# Patient Record
Sex: Female | Born: 1945 | Race: White | Hispanic: No | Smoking: Never smoker
Health system: Southern US, Community
[De-identification: ages and names within clinical notes are randomized; demographics above are authoritative.]

## PROBLEM LIST (undated history)

## (undated) DIAGNOSIS — F32A Depression, unspecified: Secondary | ICD-10-CM

## (undated) DIAGNOSIS — Z87820 Personal history of traumatic brain injury: Secondary | ICD-10-CM

## (undated) DIAGNOSIS — E119 Type 2 diabetes mellitus without complications: Secondary | ICD-10-CM

## (undated) DIAGNOSIS — E785 Hyperlipidemia, unspecified: Secondary | ICD-10-CM

## (undated) DIAGNOSIS — I10 Essential (primary) hypertension: Secondary | ICD-10-CM

## (undated) DIAGNOSIS — K5190 Ulcerative colitis, unspecified, without complications: Secondary | ICD-10-CM

## (undated) DIAGNOSIS — F419 Anxiety disorder, unspecified: Secondary | ICD-10-CM

## (undated) DIAGNOSIS — F439 Reaction to severe stress, unspecified: Secondary | ICD-10-CM

## (undated) DIAGNOSIS — F329 Major depressive disorder, single episode, unspecified: Secondary | ICD-10-CM

## (undated) DIAGNOSIS — K219 Gastro-esophageal reflux disease without esophagitis: Secondary | ICD-10-CM

## (undated) DIAGNOSIS — F410 Panic disorder [episodic paroxysmal anxiety] without agoraphobia: Secondary | ICD-10-CM

## (undated) DIAGNOSIS — M26629 Arthralgia of temporomandibular joint, unspecified side: Secondary | ICD-10-CM

## (undated) DIAGNOSIS — G5600 Carpal tunnel syndrome, unspecified upper limb: Secondary | ICD-10-CM

## (undated) DIAGNOSIS — E7800 Pure hypercholesterolemia, unspecified: Secondary | ICD-10-CM

## (undated) DIAGNOSIS — E079 Disorder of thyroid, unspecified: Secondary | ICD-10-CM

## (undated) HISTORY — DX: Personal history of traumatic brain injury: Z87.820

## (undated) HISTORY — DX: Ulcerative colitis, unspecified, without complications: K51.90

## (undated) HISTORY — PX: TOTAL ABDOMINAL HYSTERECTOMY: SHX209

## (undated) HISTORY — DX: Type 2 diabetes mellitus without complications: E11.9

## (undated) HISTORY — DX: Hyperlipidemia, unspecified: E78.5

## (undated) HISTORY — DX: Gastro-esophageal reflux disease without esophagitis: K21.9

## (undated) HISTORY — DX: Depression, unspecified: F32.A

## (undated) HISTORY — DX: Anxiety disorder, unspecified: F41.9

## (undated) HISTORY — PX: HX HYSTERECTOMY: SHX81

## (undated) HISTORY — PX: ORIF ANKLE FRACTURE: SUR919

## (undated) HISTORY — DX: Essential (primary) hypertension: I10

## (undated) HISTORY — DX: Reaction to severe stress, unspecified: F43.9

## (undated) HISTORY — PX: ABDOMINAL HYSTERECTOMY: SHX81

## (undated) HISTORY — PX: FRACTURE SURGERY: SHX138

---
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
## 1992-11-18 ENCOUNTER — Ambulatory Visit (HOSPITAL_COMMUNITY): Payer: Self-pay

## 1995-12-27 ENCOUNTER — Ambulatory Visit (HOSPITAL_COMMUNITY): Payer: Self-pay

## 2005-09-13 ENCOUNTER — Ambulatory Visit (INDEPENDENT_AMBULATORY_CARE_PROVIDER_SITE_OTHER): Payer: Self-pay | Admitting: Neurology

## 2012-04-23 ENCOUNTER — Emergency Department (HOSPITAL_BASED_OUTPATIENT_CLINIC_OR_DEPARTMENT_OTHER)
Admission: EM | Admit: 2012-04-23 | Discharge: 2012-04-23 | Disposition: A | Discharge status: Home / Self Care | Payer: Medicare Other | Attending: Emergency Medicine | Admitting: Emergency Medicine

## 2012-04-23 ENCOUNTER — Encounter (HOSPITAL_BASED_OUTPATIENT_CLINIC_OR_DEPARTMENT_OTHER): Payer: Self-pay | Admitting: *Deleted

## 2012-04-23 ENCOUNTER — Emergency Department (HOSPITAL_BASED_OUTPATIENT_CLINIC_OR_DEPARTMENT_OTHER): Payer: Medicare Other

## 2012-04-23 DIAGNOSIS — E119 Type 2 diabetes mellitus without complications: Secondary | ICD-10-CM | POA: Insufficient documentation

## 2012-04-23 DIAGNOSIS — G5600 Carpal tunnel syndrome, unspecified upper limb: Secondary | ICD-10-CM | POA: Insufficient documentation

## 2012-04-23 DIAGNOSIS — K219 Gastro-esophageal reflux disease without esophagitis: Secondary | ICD-10-CM | POA: Insufficient documentation

## 2012-04-23 DIAGNOSIS — Z79899 Other long term (current) drug therapy: Secondary | ICD-10-CM | POA: Insufficient documentation

## 2012-04-23 DIAGNOSIS — I10 Essential (primary) hypertension: Secondary | ICD-10-CM | POA: Insufficient documentation

## 2012-04-23 DIAGNOSIS — G5602 Carpal tunnel syndrome, left upper limb: Secondary | ICD-10-CM

## 2012-04-23 DIAGNOSIS — M67439 Ganglion, unspecified wrist: Secondary | ICD-10-CM

## 2012-04-23 DIAGNOSIS — E7800 Pure hypercholesterolemia, unspecified: Secondary | ICD-10-CM | POA: Insufficient documentation

## 2012-04-23 DIAGNOSIS — M6740 Ganglion, unspecified site: Secondary | ICD-10-CM | POA: Insufficient documentation

## 2012-04-23 DIAGNOSIS — E079 Disorder of thyroid, unspecified: Secondary | ICD-10-CM | POA: Insufficient documentation

## 2012-04-23 HISTORY — DX: Disorder of thyroid, unspecified: E07.9

## 2012-04-23 HISTORY — DX: Carpal tunnel syndrome, unspecified upper limb: G56.00

## 2012-04-23 HISTORY — DX: Major depressive disorder, single episode, unspecified: F32.9

## 2012-04-23 HISTORY — DX: Gastro-esophageal reflux disease without esophagitis: K21.9

## 2012-04-23 HISTORY — DX: Pure hypercholesterolemia, unspecified: E78.00

## 2012-04-23 HISTORY — DX: Anxiety disorder, unspecified: F41.9

## 2012-04-23 HISTORY — DX: Panic disorder (episodic paroxysmal anxiety): F41.0

## 2012-04-23 HISTORY — DX: Essential (primary) hypertension: I10

## 2012-04-23 HISTORY — DX: Depression, unspecified: F32.A

## 2012-04-23 MED ORDER — FLUTICASONE FUROATE 27.5 MCG/SPRAY NA SUSP: NASAL | Status: AC

## 2012-04-23 MED ORDER — TETANUS-DIPHTH-ACELL PERTUSSIS 5-2.5-18.5 LF-MCG/0.5 IM SUSP
0.5000 mL | Freq: Once | INTRAMUSCULAR | Status: DC
  Filled 2012-04-23: qty 0.5

## 2012-04-23 NOTE — ED Notes (Signed)
Pt states she hasn't had any vaccines since childhood- does not want tetanus booster today due to multiple allergies to "chemicals" and is afraid of a reaction

## 2012-04-23 NOTE — ED Provider Notes (Signed)
History   This chart was scribed for Stacey Numbers, MD by Stacey Thomas . The patient was seen in room MH06/MH06.  '  CSN: [id number]  Arrival date & time 04/23/12  1403   First MD Initiated Contact with Patient 04/23/12 1502      Chief Complaint  Patient presents with  . Hand Pain    (Consider location/radiation/quality/duration/timing/severity/associated sxs/prior treatment) HPI Stacey Thomas is a 66 y.o. female who presents to the Emergency Department complaining of constant, moderate left hand pain. Patient states that she has had trouble with her left hand for over a year. Patient states that she believes there may be metal or glass in her hand due to moving 15 months ago. Patient states that her left hand pain worsened today and describes the pain as burning. Patient reports that the hand pain is a 10/10 at it's worse. Patient reports associated swelling.Patient states that she tried ice packs with minimal relief. Patient states that she took Aleve for her symptoms, but states that she has not taken any today. Patient states that she has been seen for carpel tunnel for the same hand previously. Patient reports numbness in her middle finger. Patient states that they preformed EMG. Patient reports that she also has a ganglion cyst on left hand. Tetanus is not UTD. Patient denies having any imaging performed.  Patient also reports having trouble with her ears and states that she has fluid. Patient states that she has an appt with a hearing doctor this month. Patient denies using any nasal spray or nasal steroids. Patient reports no relief of her symptoms.   Past Medical History  Diagnosis Date  . Diabetes mellitus   . Hypertension   . Acid reflux   . Thyroid disease   . Depression   . Anxiety   . Panic attacks   . Hypercholesteremia   . CTS (carpal tunnel syndrome)     Past Surgical History  Procedure Date  . Cesarean section   . Abdominal hysterectomy   . Fracture  surgery     History reviewed. No pertinent family history.  History  Substance Use Topics  . Smoking status: Never Smoker   . Smokeless tobacco: Not on file  . Alcohol Use: No    OB History    Grav Para Term Preterm Abortions TAB SAB Ect Mult Living                  Review of Systems  Constitutional: Negative for fever.  HENT: Positive for congestion.   Eyes: Negative.   Respiratory: Negative.   Cardiovascular: Negative for chest pain.  Gastrointestinal: Negative for nausea and vomiting.  Genitourinary: Negative.   Musculoskeletal:       Left hand pain.  Skin: Negative.   Neurological: Positive for numbness.  Hematological: Negative.   Psychiatric/Behavioral: Negative.   All other systems reviewed and are negative.    Allergies  Allegra; Claritin; Codeine; Ibuprofen; Pseudoephedrine hcl; Tylenol; and Zyrtec  Home Medications   Current Outpatient Rx  Name Route Sig Dispense Refill  . AMLODIPINE-OLMESARTAN 5-20 MG PO TABS Oral Take 1 tablet by mouth daily.    Marland Kitchen CLONAZEPAM 1 MG PO TABS Oral Take 1 mg by mouth 3 (three) times daily as needed.    . DULOXETINE HCL 60 MG PO CPEP Oral Take 60 mg by mouth 2 (two) times daily.    Marland Kitchen GLIPIZIDE ER 5 MG PO TB24 Oral Take 5 mg by mouth daily.    Marland Kitchen  LEVOTHYROXINE SODIUM 50 MCG PO TABS Oral Take 50 mcg by mouth daily.    Marland Kitchen METFORMIN HCL 1000 MG PO TABS Oral Take 1,000 mg by mouth 2 (two) times daily with a meal.    . PANTOPRAZOLE SODIUM 40 MG PO TBEC Oral Take 40 mg by mouth daily.      BP 124/71  Pulse 108  Temp 97.8 F (36.6 C) (Oral)  Resp 20  Ht 5' 1.5" (1.562 m)  Wt 183 lb (83.008 kg)  BMI 34.02 kg/m2  SpO2 96%  Physical Exam  Nursing note and vitals reviewed. GEN: Well-developed, well-nourished female in no distress HEENT: Atraumatic, normocephalic. Oropharynx clear without erythema. TMs clear bilaterally. EYES: PERRLA BL, no scleral icterus. NECK: Trachea midline, no meningismus CV: regular rate and rhythm. No  murmurs, rubs, or gallops PULM: No respiratory distress.  No crackles, wheezes, or rales. GI: soft, non-tender. No guarding, rebound, or tenderness. + bowel sounds  GU: deferred Neuro: cranial nerves grossly 2-12 intact, no abnormalities of strength or sensation, A and O x 3 MSK: Patient moves all 4 extremities symmetrically, no deformity, edema, or injury noted, patient has palpable one half centimeter ganglion cyst over the dorsum of the left wrist. Patient complains of decreased sensation in the first through third digits of the left hand. This has been present for over a year. Patient also has a small 2 mm x 1 mm area that is raised that she feels a foreign body stuck in her hand from an instant 15 months ago. This does not appear to be infected and has no area of erythema, fluctuance, or or induration associated with it. Skin: No rashes petechiae, purpura, or jaundice Psych: no abnormality of mood   ED Course  Procedures (including critical care time)  DIAGNOSTIC STUDIES: Oxygen Saturation is 96% on room air, adequate by my interpretation.    COORDINATION OF CARE:  1513: Discussed planned course of treatment with the patient, who is agreeable at this time.  1530: Medication Orders: TDap (Booxtrix) injection 0.5 mL-once.  1640: Informed patient of imaging results and negative findings for a foreign body.   Labs Reviewed - No data to display Dg Finger Thumb Left  04/23/2012  *RADIOLOGY REPORT*  Clinical Data: And swelling.  LEFT THUMB 2+V  Comparison: None.  Findings: DIP joint of the thumb osteoarthritis is present. Anatomic alignment.  Mild MCP joint osteoarthritis is also present. No radiopaque foreign body.  No fracture.  IMPRESSION: Mild osteoarthritis of the thumb.  No acute abnormality.  Original Report Authenticated By: Andreas Newport, M.D.     1. Ganglion cyst of wrist   2. Carpal tunnel syndrome of left wrist       MDM  Patient was evaluated by myself. He is on  evaluation patient had plain film of the film today and sure that there is no foreign body in the hand. I discussed that even if there were would not be removing that as it has been present for at least 15 months. Patient was offered a tetanus shot but then declined this. Patient had negative plain film which is reviewed by myself. Given her reports of carpal tunnel syndrome patient was provided with a left wrist splint. Patient also was given ice for this. She stated that she had an orthopedist in [place] she can followup with their did provide her with the name of her hand surgeon on call should she require followup for these issues. Patient also complained of fullness in her ear  is with some associated nasal congestion. She had no significant findings on exam today. I did write her a prescription for fluticasone to try for this. Patient was discharged in good condition. She can followup with her PCP regarding her sensation of ear fullness.  I personally performed the services described in this documentation, which was scribed in my presence. The recorded information has been reviewed and considered.          Stacey Numbers, MD 04/23/12 1710

## 2012-04-23 NOTE — ED Notes (Signed)
Pt states that she has CTS and is having multiple problems with her left hand. Also wants ears checked.

## 2012-04-23 NOTE — ED Notes (Signed)
D/c home with ice pack for home use and rx x 1

## 2012-08-12 ENCOUNTER — Encounter (HOSPITAL_BASED_OUTPATIENT_CLINIC_OR_DEPARTMENT_OTHER): Payer: Self-pay | Admitting: Student

## 2012-08-12 ENCOUNTER — Emergency Department (HOSPITAL_BASED_OUTPATIENT_CLINIC_OR_DEPARTMENT_OTHER)
Admission: EM | Admit: 2012-08-12 | Discharge: 2012-08-12 | Disposition: A | Discharge status: Home / Self Care | Payer: Medicare Other | Attending: Emergency Medicine | Admitting: Emergency Medicine

## 2012-08-12 DIAGNOSIS — Z8669 Personal history of other diseases of the nervous system and sense organs: Secondary | ICD-10-CM | POA: Insufficient documentation

## 2012-08-12 DIAGNOSIS — M542 Cervicalgia: Secondary | ICD-10-CM | POA: Insufficient documentation

## 2012-08-12 DIAGNOSIS — Z79899 Other long term (current) drug therapy: Secondary | ICD-10-CM | POA: Insufficient documentation

## 2012-08-12 DIAGNOSIS — F3289 Other specified depressive episodes: Secondary | ICD-10-CM | POA: Insufficient documentation

## 2012-08-12 DIAGNOSIS — Z8659 Personal history of other mental and behavioral disorders: Secondary | ICD-10-CM | POA: Insufficient documentation

## 2012-08-12 DIAGNOSIS — E079 Disorder of thyroid, unspecified: Secondary | ICD-10-CM | POA: Insufficient documentation

## 2012-08-12 DIAGNOSIS — F411 Generalized anxiety disorder: Secondary | ICD-10-CM | POA: Insufficient documentation

## 2012-08-12 DIAGNOSIS — F329 Major depressive disorder, single episode, unspecified: Secondary | ICD-10-CM | POA: Insufficient documentation

## 2012-08-12 DIAGNOSIS — H9209 Otalgia, unspecified ear: Secondary | ICD-10-CM | POA: Insufficient documentation

## 2012-08-12 DIAGNOSIS — E7800 Pure hypercholesterolemia, unspecified: Secondary | ICD-10-CM | POA: Insufficient documentation

## 2012-08-12 DIAGNOSIS — K219 Gastro-esophageal reflux disease without esophagitis: Secondary | ICD-10-CM | POA: Insufficient documentation

## 2012-08-12 DIAGNOSIS — M26629 Arthralgia of temporomandibular joint, unspecified side: Secondary | ICD-10-CM | POA: Insufficient documentation

## 2012-08-12 DIAGNOSIS — E119 Type 2 diabetes mellitus without complications: Secondary | ICD-10-CM | POA: Insufficient documentation

## 2012-08-12 DIAGNOSIS — I10 Essential (primary) hypertension: Secondary | ICD-10-CM | POA: Insufficient documentation

## 2012-08-12 DIAGNOSIS — F419 Anxiety disorder, unspecified: Secondary | ICD-10-CM

## 2012-08-12 MED ORDER — CLONAZEPAM 1 MG PO TABS
1.0000 mg | ORAL_TABLET | Freq: Once | ORAL | Status: DC
  Filled 2012-08-12: qty 1

## 2012-08-12 MED ORDER — LORAZEPAM 1 MG PO TABS
ORAL_TABLET | ORAL | Status: AC
  Administered 2012-08-12: 1 mg via ORAL
  Filled 2012-08-12: qty 1

## 2012-08-12 MED ORDER — CLONAZEPAM 0.5 MG PO TABS
0.5000 mg | ORAL_TABLET | Freq: Once | ORAL | Status: DC
  Filled 2012-08-12: qty 1

## 2012-08-12 MED ORDER — LORAZEPAM 1 MG PO TABS
1.0000 mg | ORAL_TABLET | Freq: Once | ORAL | Status: AC
  Administered 2012-08-12: 1 mg via ORAL

## 2012-08-12 NOTE — ED Notes (Signed)
MD at bedside. 

## 2012-08-12 NOTE — ED Notes (Signed)
Pt in with c/o bilateral TMJ pain, bilateral earache, neck pain.

## 2012-08-12 NOTE — ED Provider Notes (Signed)
History     CSN: [id number]  Arrival date & time 08/12/12  1303   First MD Initiated Contact with Patient 08/12/12 1325      Chief Complaint  Patient presents with  . Jaw Pain    bilateral jaw pain  . Otalgia  . 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 She did move down here about a year ago from [place] for [place] and has not established care with her oral surgeon since then. She does have a primary care physician but has not seen a dentist or oral surgeon here in town. She actually denies any need for pain medicine she just states that she's feeling very anxious about it. She states that she sees a psychiatrist who has not yet been able to refill her Klonopin for her anxiety and she feels like her nerves have been flaring up. She's been trying to get down to see her psychiatrist but she states that every time she has appointment she gets sick. She's not having any suicidal ideations.  Patient is a 66 y.o. female presenting with ear pain and neck pain.  Otalgia Associated symptoms include neck pain. Pertinent negatives include no headaches, no rhinorrhea, no abdominal pain, no diarrhea, no vomiting, no cough and no rash.  Neck Pain  Pertinent negatives include no chest pain, no numbness, no headaches and no weakness.    Past Medical History  Diagnosis Date  . Diabetes mellitus   . Hypertension   . Acid reflux   . Thyroid disease   . Depression   . Anxiety   . Panic attacks   . Hypercholesteremia   . CTS (carpal tunnel syndrome)     Past Surgical History  Procedure Date  . Cesarean section   . Abdominal hysterectomy   . Fracture surgery     History reviewed. No pertinent family history.  History    Substance Use Topics  . Smoking status: Never Smoker   . Smokeless tobacco: Not on file  . Alcohol Use: No    OB History    Grav Para Term Preterm Abortions TAB SAB Ect Mult Living                  Review of Systems  Constitutional: Negative for fever, chills, diaphoresis and fatigue.  HENT: Positive for ear pain, neck pain and dental problem. Negative for congestion, rhinorrhea, sneezing and trouble swallowing.   Eyes: Negative.   Respiratory: Negative for cough, chest tightness and shortness of breath.   Cardiovascular: Negative for chest pain and leg swelling.  Gastrointestinal: Negative for nausea, vomiting, abdominal pain, diarrhea and blood in stool.  Genitourinary: Negative for frequency, hematuria, flank pain and difficulty urinating.  Musculoskeletal: Negative for back pain and arthralgias.  Skin: Negative for rash.  Neurological: Negative for dizziness, speech difficulty, weakness, numbness and headaches.  Psychiatric/Behavioral: Negative for suicidal ideas and hallucinations. The patient is nervous/anxious.     Allergies  Allegra; Claritin; Codeine; Ibuprofen; Pseudoephedrine hcl; Tylenol; and Zyrtec  Home Medications   Current Outpatient Rx  Name Route Sig Dispense Refill  . AMLODIPINE-OLMESARTAN 5-20 MG PO TABS Oral Take 1 tablet by mouth daily.    Marland Kitchen CLONAZEPAM 1 MG PO TABS Oral Take 1 mg by mouth 3 (three) times daily as needed.    Marland Kitchen  DULOXETINE HCL 60 MG PO CPEP Oral Take 60 mg by mouth 2 (two) times daily.    Marland Kitchen FLUTICASONE FUROATE 27.5 MCG/SPRAY NA SUSP  2 sprays into each nasal passage daily as needed for congestion. 10 g 12  . GLIPIZIDE ER 5 MG PO TB24 Oral Take 5 mg by mouth daily.    Marland Kitchen LEVOTHYROXINE SODIUM 50 MCG PO TABS Oral Take 50 mcg by mouth daily.    Marland Kitchen METFORMIN HCL 1000 MG PO TABS Oral Take 1,000 mg by mouth 2 (two) times daily with a meal.    . PANTOPRAZOLE SODIUM 40 MG PO TBEC Oral Take 40 mg by mouth daily.      BP 138/70  Pulse 90  Temp  99.3 F (37.4 C) (Oral)  Resp 18  Wt 175 lb (79.379 kg)  SpO2 99%  Physical Exam  Constitutional: She is oriented to person, place, and time. She appears well-developed and well-nourished.  HENT:  Head: Normocephalic and atraumatic.  Mouth/Throat: Oropharynx is clear and moist.       No evidence of infection around her teeth. She has tenderness along her TMJ bilaterally however there is no clicking or popping sounds. Her TMs are clear bilaterally  Eyes: Pupils are equal, round, and reactive to light.  Neck: Normal range of motion. Neck supple.  Cardiovascular: Normal rate, regular rhythm and normal heart sounds.   Pulmonary/Chest: Effort normal and breath sounds normal. No respiratory distress. She has no wheezes. She has no rales. She exhibits no tenderness.  Abdominal: Soft. Bowel sounds are normal. There is no tenderness. There is no rebound and no guarding.  Musculoskeletal: Normal range of motion. She exhibits no edema.  Lymphadenopathy:    She has no cervical adenopathy.  Neurological: She is alert and oriented to person, place, and time.  Skin: Skin is warm and dry. No rash noted.  Psychiatric: She has a normal mood and affect.       She appears very anxious and is tearful at times    ED Course  Procedures (including critical care time)  Labs Reviewed - No data to display No results found.   1. TMJ arthralgia   2. Anxiety       MDM  Patient comes in today with multiple complaints primarily to her TMJ pain is bothering her. She denies any for pain medicine and says that her pain is controlled to leave. She appears very anxious and upset with her psychiatrist for not prescribing Klonopin. She did not appear to be suicidal. I discussed at length the importance of following up with her psychiatrist. I will go to her one dose of Klonopin here in emergency department and she was her followup with her psychiatrist on Monday. She has no evidence of dental infection currently.  She was given resources for followup regarding her TMJ.        Rolan Bucco, MD 08/12/12 1350

## 2013-08-18 ENCOUNTER — Emergency Department (HOSPITAL_BASED_OUTPATIENT_CLINIC_OR_DEPARTMENT_OTHER): Payer: Medicare Other

## 2013-08-18 ENCOUNTER — Emergency Department (HOSPITAL_BASED_OUTPATIENT_CLINIC_OR_DEPARTMENT_OTHER)
Admission: EM | Admit: 2013-08-18 | Discharge: 2013-08-18 | Disposition: A | Discharge status: Home / Self Care | Payer: Medicare Other | Attending: Emergency Medicine | Admitting: Emergency Medicine

## 2013-08-18 ENCOUNTER — Encounter (HOSPITAL_BASED_OUTPATIENT_CLINIC_OR_DEPARTMENT_OTHER): Payer: Self-pay | Admitting: Emergency Medicine

## 2013-08-18 DIAGNOSIS — M79671 Pain in right foot: Secondary | ICD-10-CM

## 2013-08-18 DIAGNOSIS — Z8669 Personal history of other diseases of the nervous system and sense organs: Secondary | ICD-10-CM | POA: Insufficient documentation

## 2013-08-18 DIAGNOSIS — K219 Gastro-esophageal reflux disease without esophagitis: Secondary | ICD-10-CM | POA: Insufficient documentation

## 2013-08-18 DIAGNOSIS — S8002XA Contusion of left knee, initial encounter: Secondary | ICD-10-CM

## 2013-08-18 DIAGNOSIS — E119 Type 2 diabetes mellitus without complications: Secondary | ICD-10-CM | POA: Insufficient documentation

## 2013-08-18 DIAGNOSIS — Z79899 Other long term (current) drug therapy: Secondary | ICD-10-CM | POA: Insufficient documentation

## 2013-08-18 DIAGNOSIS — F329 Major depressive disorder, single episode, unspecified: Secondary | ICD-10-CM | POA: Insufficient documentation

## 2013-08-18 DIAGNOSIS — F3289 Other specified depressive episodes: Secondary | ICD-10-CM | POA: Insufficient documentation

## 2013-08-18 DIAGNOSIS — S8000XA Contusion of unspecified knee, initial encounter: Secondary | ICD-10-CM | POA: Insufficient documentation

## 2013-08-18 DIAGNOSIS — Y9389 Activity, other specified: Secondary | ICD-10-CM | POA: Insufficient documentation

## 2013-08-18 DIAGNOSIS — W108XXA Fall (on) (from) other stairs and steps, initial encounter: Secondary | ICD-10-CM | POA: Insufficient documentation

## 2013-08-18 DIAGNOSIS — F410 Panic disorder [episodic paroxysmal anxiety] without agoraphobia: Secondary | ICD-10-CM | POA: Insufficient documentation

## 2013-08-18 DIAGNOSIS — IMO0002 Reserved for concepts with insufficient information to code with codable children: Secondary | ICD-10-CM | POA: Insufficient documentation

## 2013-08-18 DIAGNOSIS — Y9289 Other specified places as the place of occurrence of the external cause: Secondary | ICD-10-CM | POA: Insufficient documentation

## 2013-08-18 DIAGNOSIS — I10 Essential (primary) hypertension: Secondary | ICD-10-CM | POA: Insufficient documentation

## 2013-08-18 HISTORY — DX: Arthralgia of temporomandibular joint, unspecified side: M26.629

## 2013-08-18 HISTORY — DX: Gastro-esophageal reflux disease without esophagitis: K21.9

## 2013-08-18 MED ORDER — TRAMADOL HCL 50 MG PO TABS: 50.0000 mg | ORAL_TABLET | Freq: Four times a day (QID) | ORAL | Status: AC | PRN

## 2013-08-18 NOTE — ED Provider Notes (Signed)
CSN: [id number]     Arrival date & time 08/18/13  1650 History   First MD Initiated Contact with Patient 08/18/13 1840     Chief Complaint  Patient presents with  . Fall  . Knee Pain   (Consider location/radiation/quality/duration/timing/severity/associated sxs/prior Treatment) Patient is a 67 y.o. female presenting with fall. The history is provided by the patient. No language interpreter was used.  Fall This is a new problem. The current episode started today. The problem occurs constantly. The problem has been gradually worsening. Pertinent negatives include no joint swelling. Nothing aggravates the symptoms. She has tried nothing for the symptoms. The treatment provided no relief.   Pt complains of pain in her left knee after falling on steps.  Pain with walking.   Pt also complain of soreness in her foot Past Medical History  Diagnosis Date  . Diabetes mellitus   . Hypertension   . Acid reflux   . Thyroid disease   . Depression   . Anxiety   . Panic attacks   . Hypercholesteremia   . CTS (carpal tunnel syndrome)   . Acid reflux   . GERD (gastroesophageal reflux disease)   . TMJ arthralgia    Past Surgical History  Procedure Laterality Date  . Cesarean section    . Abdominal hysterectomy    . Fracture surgery     History reviewed. No pertinent family history. History  Substance Use Topics  . Smoking status: Never Smoker   . Smokeless tobacco: Not on file  . Alcohol Use: No   OB History   Grav Para Term Preterm Abortions TAB SAB Ect Mult Living                 Review of Systems  Musculoskeletal: Negative for joint swelling.  All other systems reviewed and are negative.    Allergies  Allegra; Claritin; Codeine; Cortisone; Ibuprofen; Pseudoephedrine hcl; Tylenol; and Zyrtec  Home Medications   Current Outpatient Rx  Name  Route  Sig  Dispense  Refill  . amLODipine-olmesartan (AZOR) 5-20 MG per tablet   Oral   Take 1 tablet by mouth daily.         .  clonazePAM (KLONOPIN) 1 MG tablet   Oral   Take 1 mg by mouth 3 (three) times daily as needed.         . DULoxetine (CYMBALTA) 60 MG capsule   Oral   Take 60 mg by mouth 2 (two) times daily.         . fluticasone (VERAMYST) 27.5 MCG/SPRAY nasal spray      2 sprays into each nasal passage daily as needed for congestion.   10 g   12   . glipiZIDE (GLUCOTROL XL) 5 MG 24 hr tablet   Oral   Take 5 mg by mouth daily.         Marland Kitchen levothyroxine (SYNTHROID, LEVOTHROID) 50 MCG tablet   Oral   Take 50 mcg by mouth daily.         . metFORMIN (GLUCOPHAGE) 1000 MG tablet   Oral   Take 1,000 mg by mouth 2 (two) times daily with a meal.         . pantoprazole (PROTONIX) 40 MG tablet   Oral   Take 40 mg by mouth daily.          BP 111/60  Pulse 94  Temp(Src) 98.5 F (36.9 C) (Oral)  Wt 179 lb (81.194 kg)  BMI 33.28 kg/m2  SpO2  94% Physical Exam  Nursing note and vitals reviewed. Constitutional: She is oriented to person, place, and time. She appears well-developed and well-nourished.  Musculoskeletal: She exhibits tenderness.  Tender bruised left knee,   Tender left foot, no bruising  Neurological: She is alert and oriented to person, place, and time. She has normal reflexes.  Skin: Skin is warm.  Psychiatric: She has a normal mood and affect.    ED Course  Procedures (including critical care time) Labs Review Labs Reviewed - No data to display Imaging Review Dg Knee Complete 4 Views Left  08/18/2013   CLINICAL DATA:  Fall. Knee injury and pain.  EXAM: LEFT KNEE - COMPLETE 4+ VIEW  COMPARISON:  None.  FINDINGS: There is no evidence of fracture, dislocation, or joint effusion. Mild degenerative spurring is seen involving the tibial spines. No evidence of joint space narrowing. Mild enthesopathic changes seen at the quadriceps insertion on the patella. No other bone abnormality identified.  IMPRESSION: No acute findings.   Electronically Signed   By: Myles Rosenthal M.D.    On: 08/18/2013 19:07   Dg Foot Complete Left  08/18/2013   CLINICAL DATA:  Fall. Foot injury and pain.  EXAM: LEFT FOOT - COMPLETE 3+ VIEW  COMPARISON:  None.  FINDINGS: There is no evidence of fracture or dislocation. Severe osteoarthritis is seen involving the 1st metatarsophalangeal joint. Plantar and dorsal calcaneal bone spurs also noted.  IMPRESSION: No acute findings.  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   Electronically Signed   By: Myles Rosenthal M.D.   On: 08/18/2013 19:08    EKG Interpretation   None       MDM   1. Contusion of left knee, initial encounter   2. Pain in right foot    No fracture.   Pt given rx for ultram.   Pt advised to follow up with Dr. Pearletha Forge for recheck in 1 week.    Elson Areas, PA-C 08/18/13 2009

## 2013-08-18 NOTE — ED Notes (Signed)
Pt reports fall while ascending cement stairs, hitting cement porch with left knee. Difficulty ambulating.

## 2013-08-19 NOTE — ED Provider Notes (Signed)
Medical screening examination/treatment/procedure(s) were performed by non-physician practitioner and as supervising physician I was immediately available for consultation/collaboration.  EKG Interpretation   None         Junius Argyle, MD 08/19/13 [phone]

## 2014-03-23 IMAGING — CR DG KNEE COMPLETE 4+V*L*
4 series · 4 of 4 positions shown · non-contrast
Comparison: None.

CLINICAL DATA: Fall. Knee injury and pain.

EXAM:
LEFT KNEE - COMPLETE 4+ VIEW

[t knee ap left]
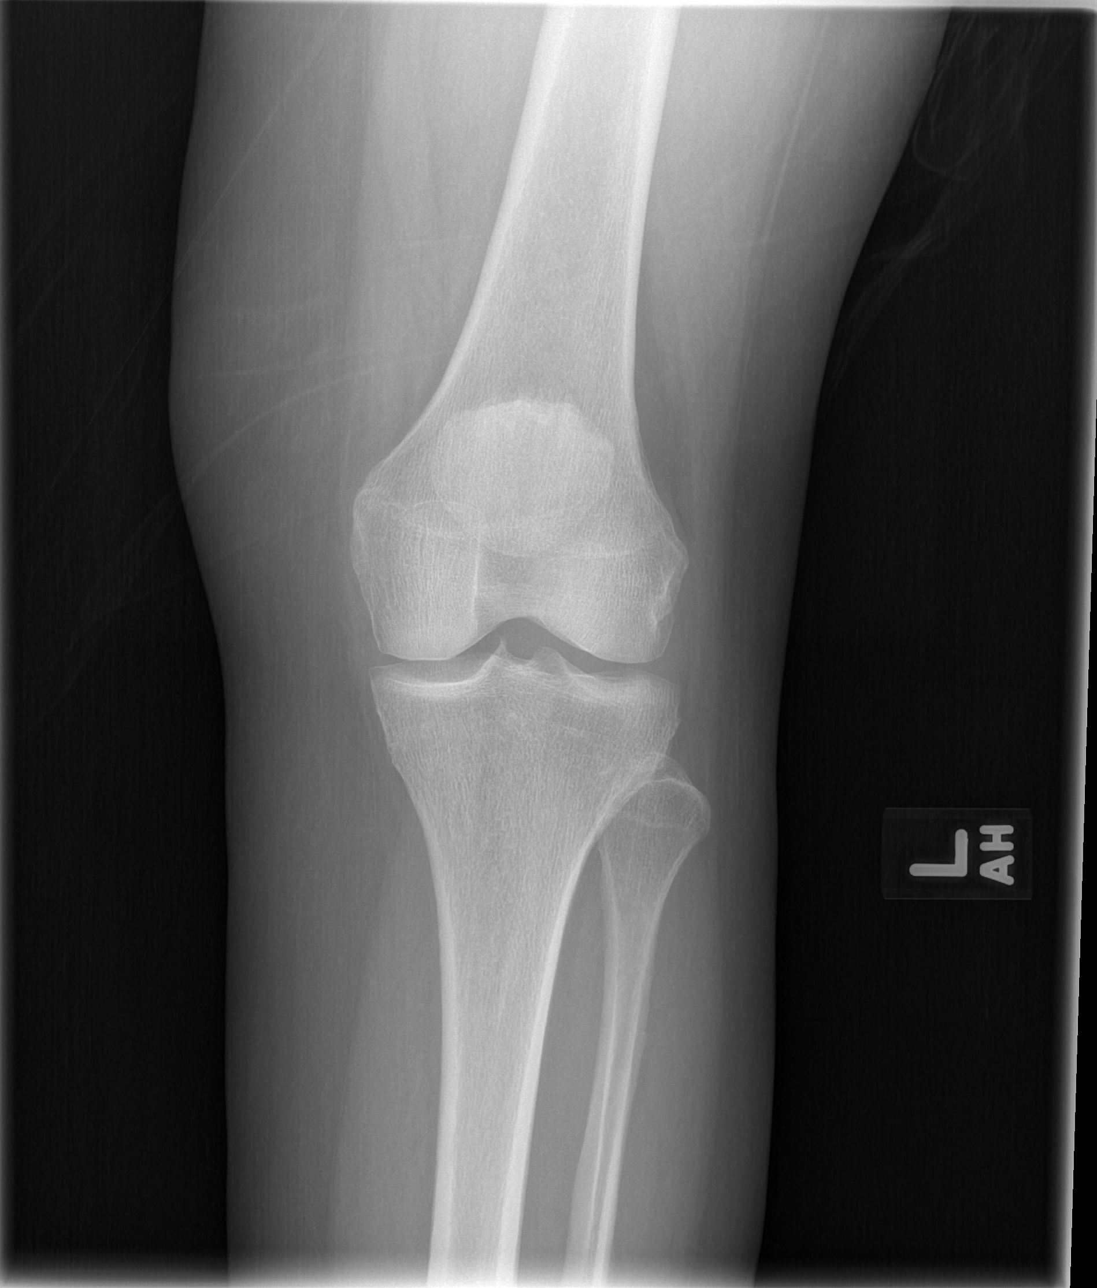

[t knee oblique left (1 of 2)]
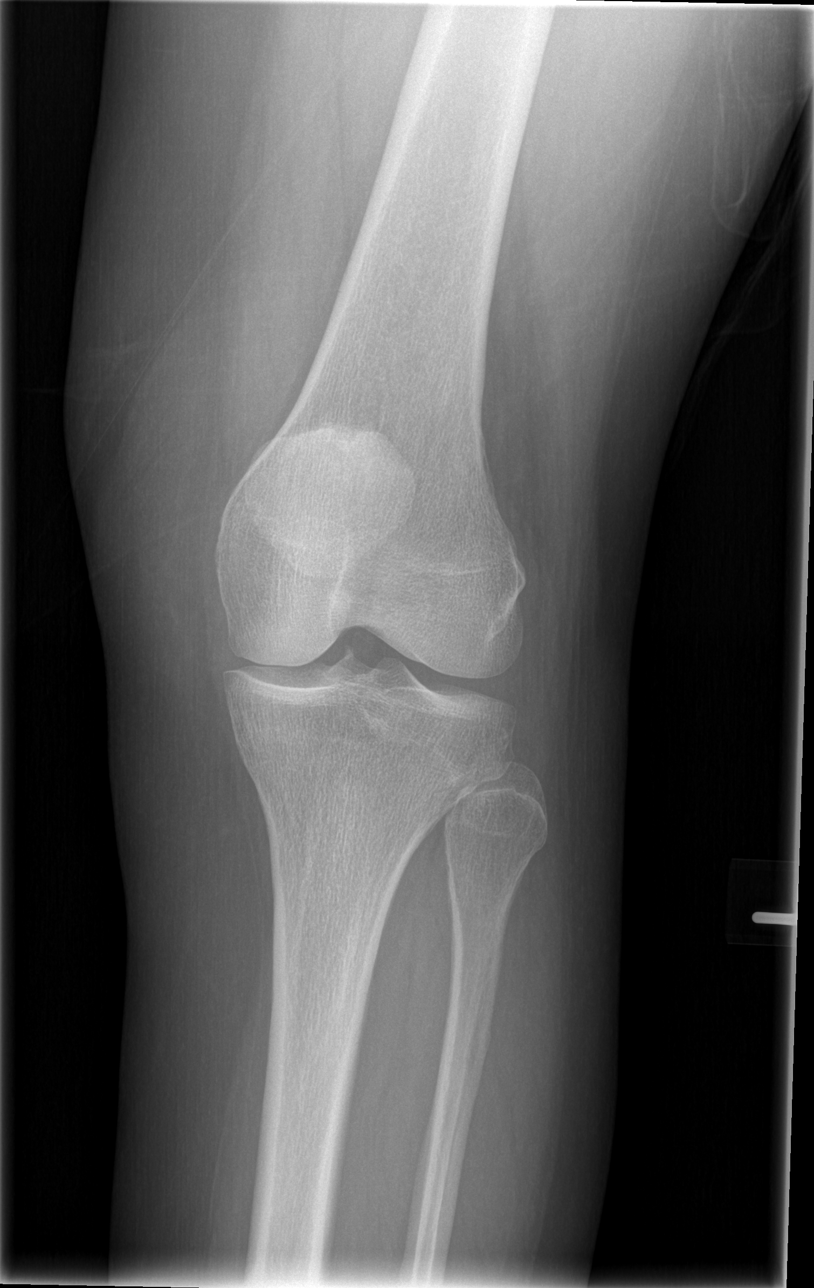

[t knee oblique left (2 of 2)]
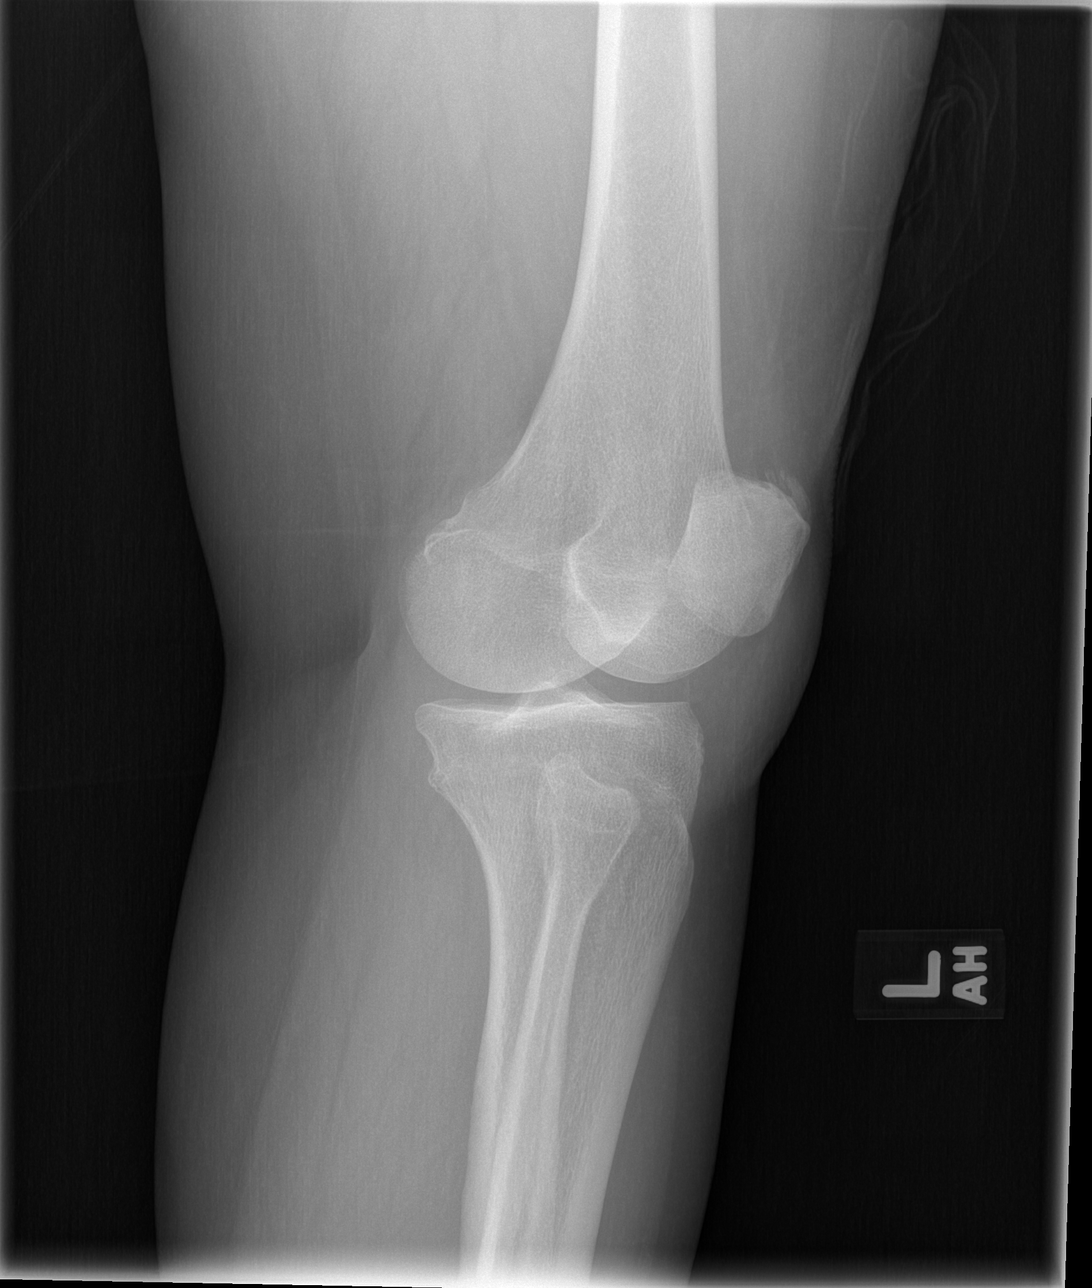

[t knee lat left]
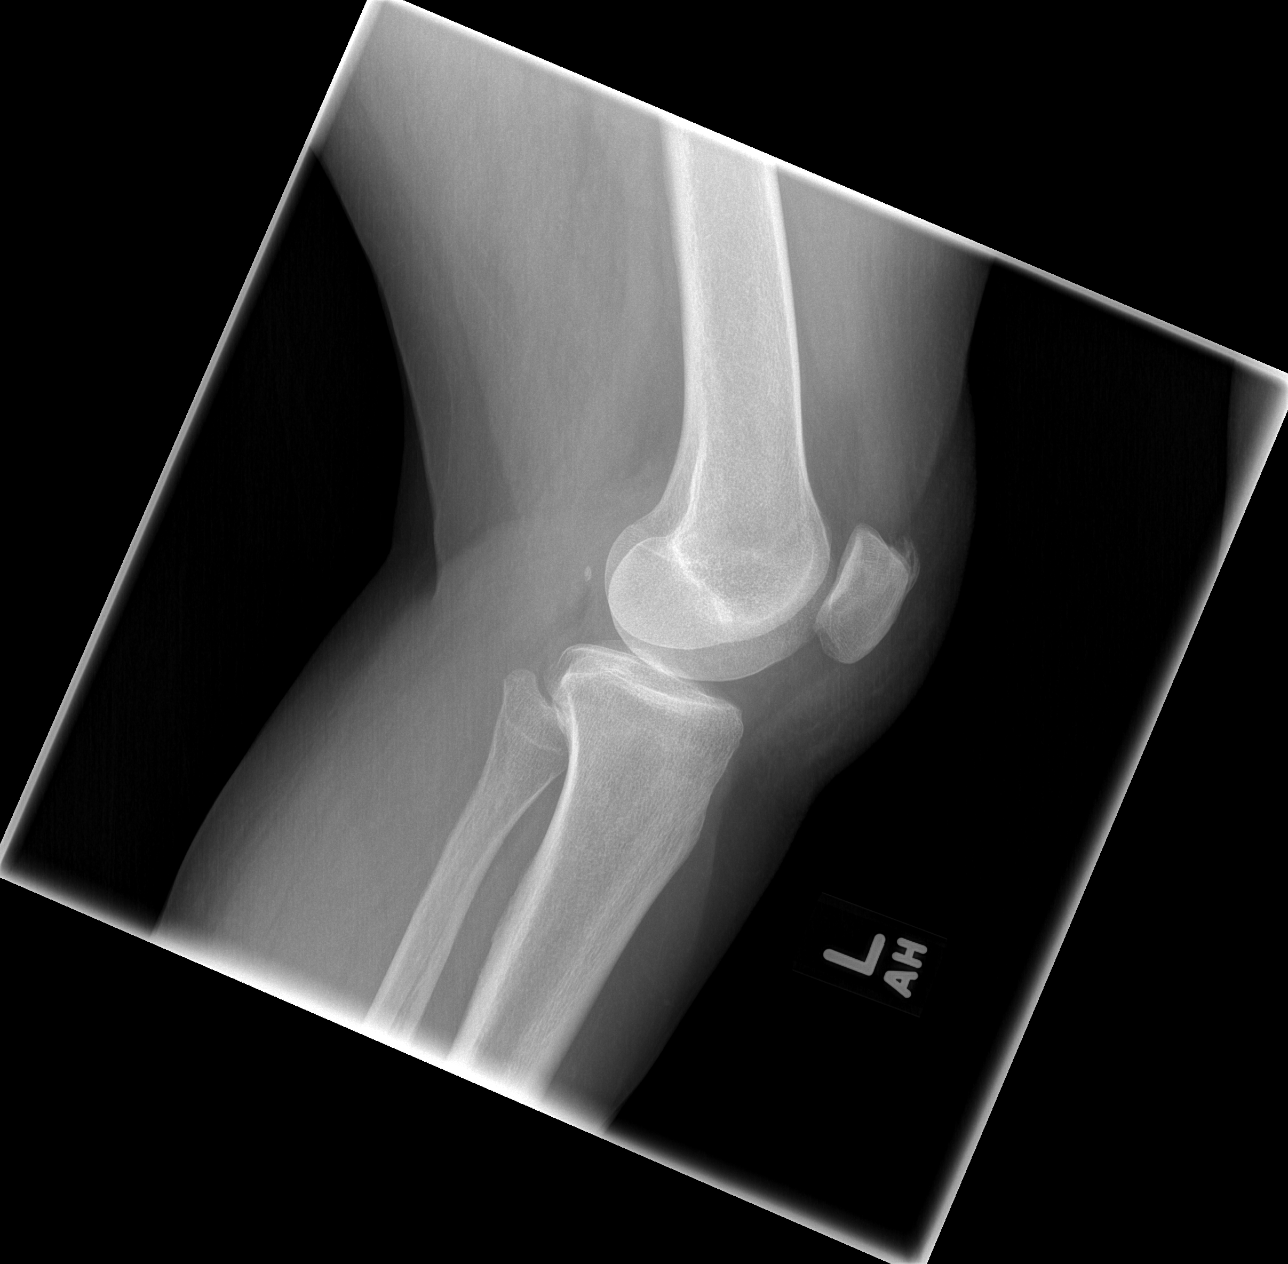

[4 of 4 positions shown; findings below may reference images not displayed]

FINDINGS: There is no evidence of fracture, dislocation, or joint effusion.
Mild degenerative spurring is seen involving the tibial spines. No
evidence of joint space narrowing. Mild enthesopathic changes seen
at the quadriceps insertion on the patella. No other bone
abnormality identified.
IMPRESSION: No acute findings.

## 2014-04-16 ENCOUNTER — Emergency Department (HOSPITAL_BASED_OUTPATIENT_CLINIC_OR_DEPARTMENT_OTHER)
Admission: EM | Admit: 2014-04-16 | Discharge: 2014-04-17 | Disposition: A | Discharge status: Home / Self Care | Payer: Medicare HMO | Attending: Emergency Medicine | Admitting: Emergency Medicine

## 2014-04-16 ENCOUNTER — Encounter (HOSPITAL_BASED_OUTPATIENT_CLINIC_OR_DEPARTMENT_OTHER): Payer: Self-pay | Admitting: Emergency Medicine

## 2014-04-16 DIAGNOSIS — K219 Gastro-esophageal reflux disease without esophagitis: Secondary | ICD-10-CM | POA: Insufficient documentation

## 2014-04-16 DIAGNOSIS — Z79899 Other long term (current) drug therapy: Secondary | ICD-10-CM | POA: Insufficient documentation

## 2014-04-16 DIAGNOSIS — Z8719 Personal history of other diseases of the digestive system: Secondary | ICD-10-CM | POA: Insufficient documentation

## 2014-04-16 DIAGNOSIS — F410 Panic disorder [episodic paroxysmal anxiety] without agoraphobia: Secondary | ICD-10-CM | POA: Insufficient documentation

## 2014-04-16 DIAGNOSIS — R0981 Nasal congestion: Secondary | ICD-10-CM

## 2014-04-16 DIAGNOSIS — J3489 Other specified disorders of nose and nasal sinuses: Secondary | ICD-10-CM | POA: Insufficient documentation

## 2014-04-16 DIAGNOSIS — F3289 Other specified depressive episodes: Secondary | ICD-10-CM | POA: Insufficient documentation

## 2014-04-16 DIAGNOSIS — E079 Disorder of thyroid, unspecified: Secondary | ICD-10-CM | POA: Insufficient documentation

## 2014-04-16 DIAGNOSIS — I10 Essential (primary) hypertension: Secondary | ICD-10-CM | POA: Insufficient documentation

## 2014-04-16 DIAGNOSIS — E119 Type 2 diabetes mellitus without complications: Secondary | ICD-10-CM | POA: Insufficient documentation

## 2014-04-16 DIAGNOSIS — Z8669 Personal history of other diseases of the nervous system and sense organs: Secondary | ICD-10-CM | POA: Insufficient documentation

## 2014-04-16 DIAGNOSIS — F329 Major depressive disorder, single episode, unspecified: Secondary | ICD-10-CM | POA: Insufficient documentation

## 2014-04-16 DIAGNOSIS — E7800 Pure hypercholesterolemia, unspecified: Secondary | ICD-10-CM | POA: Insufficient documentation

## 2014-04-16 LAB — BASIC METABOLIC PANEL
BUN: 14 mg/dL (ref 6–23)
CHLORIDE: 94 meq/L — AB (ref 96–112)
CO2: 23 meq/L (ref 19–32)
CREATININE: 0.9 mg/dL (ref 0.50–1.10)
Calcium: 9.6 mg/dL (ref 8.4–10.5)
GFR calc non Af Amer: 65 mL/min — ABNORMAL LOW (ref >90)
GFR, EST AFRICAN AMERICAN: 75 mL/min — AB (ref >90)
Glucose, Bld: 121 mg/dL — ABNORMAL HIGH (ref 70–99)
Potassium: 4.2 mEq/L (ref 3.7–5.3)
SODIUM: 132 meq/L — AB (ref 137–147)

## 2014-04-16 NOTE — ED Provider Notes (Signed)
CSN: [id number]     Arrival date & time 04/16/14  2155 History   None    This chart was scribed for Stacey Bright Smitty CordsK Javonni Macke-Rasch, MD by Arlan OrganAshley Thomas, ED Scribe. This patient was seen in room MH05/MH05 and the patient's care was started 11:13 PM.   Chief Complaint  Patient presents with  . Leg Swelling   The history is provided by the patient. No language interpreter was used.    HPI Comments: Stacey Thomas is a 68 y.o. Female with a PMHx of DM, HTN, Thyroid disease, Hypercholesteremia, GERD, and TMJ who presents to the Emergency Department complaining of a salt water taste to the mouth x 3 days that is unchanged. She also has nasal congestion and she is using saline nasal spray several times a day.  Pt also reports increase consumption of water and ongoing dental pain secondary to a recent procedure stating she is trying to detox from the rust from the procedure.  She passively notes that she thinks her hands and feet are swollen.   At this time she denies any fever or chills. No other concerns this visit.  She is followed by Dr. Avanell ShackletonSusan Payne  Past Medical History  Diagnosis Date  . Diabetes mellitus   . Hypertension   . Acid reflux   . Thyroid disease   . Depression   . Anxiety   . Panic attacks   . Hypercholesteremia   . CTS (carpal tunnel syndrome)   . Acid reflux   . GERD (gastroesophageal reflux disease)   . TMJ arthralgia    Past Surgical History  Procedure Laterality Date  . Cesarean section    . Abdominal hysterectomy    . Fracture surgery     No family history on file. History  Substance Use Topics  . Smoking status: Never Smoker   . Smokeless tobacco: Not on file  . Alcohol Use: No   OB History   Grav Para Term Preterm Abortions TAB SAB Ect Mult Living                 Review of Systems  Constitutional: Negative for fever and chills.  HENT: Positive for congestion and dental problem.   Cardiovascular: Negative for leg swelling.  Musculoskeletal: Negative for  arthralgias and joint swelling.  Psychiatric/Behavioral: Negative for suicidal ideas, hallucinations, confusion, self-injury and dysphoric mood.  All other systems reviewed and are negative.     Allergies  Allegra; Claritin; Codeine; Cortisone; Ibuprofen; Pseudoephedrine hcl; Tylenol; and Zyrtec  Home Medications   Prior to Admission medications   Medication Sig Start Date End Date Taking? Authorizing Provider  PRAVASTATIN SODIUM PO Take by mouth.   Yes Historical Provider, MD  amLODipine-olmesartan (AZOR) 5-20 MG per tablet Take 1 tablet by mouth daily.    Historical Provider, MD  clonazePAM (KLONOPIN) 1 MG tablet Take 1 mg by mouth 3 (three) times daily as needed.    Historical Provider, MD  DULoxetine (CYMBALTA) 60 MG capsule Take 60 mg by mouth 2 (two) times daily.    Historical Provider, MD  fluticasone (VERAMYST) 27.5 MCG/SPRAY nasal spray 2 sprays into each nasal passage daily as needed for congestion. 04/23/12   Cyndra NumbersMeagan Hunt, MD  glipiZIDE (GLUCOTROL XL) 5 MG 24 hr tablet Take 5 mg by mouth daily.    Historical Provider, MD  levothyroxine (SYNTHROID, LEVOTHROID) 50 MCG tablet Take 50 mcg by mouth daily.    Historical Provider, MD  metFORMIN (GLUCOPHAGE) 1000 MG tablet Take 1,000 mg by mouth  2 (two) times daily with a meal.    Historical Provider, MD  pantoprazole (PROTONIX) 40 MG tablet Take 40 mg by mouth daily.    Historical Provider, MD  traMADol (ULTRAM) 50 MG tablet Take 1 tablet (50 mg total) by mouth every 6 (six) hours as needed for pain. 08/18/13   Elson AreasLeslie K Sofia, PA-C   Triage Vitals: BP 157/75  Pulse 95  Temp(Src) 97.9 F (36.6 C) (Oral)  Resp 20  Ht 5\' 2"  (1.575 m)  Wt 179 lb (81.194 kg)  BMI 32.73 kg/m2  SpO2 100%   Physical Exam  Nursing note and vitals reviewed. Constitutional: She is oriented to person, place, and time. She appears well-developed and well-nourished. No distress.  HENT:  Head: Normocephalic and atraumatic.  Right Ear: Tympanic membrane  normal.  Left Ear: Tympanic membrane normal.  Nose: No septal deviation or nasal septal hematoma.  Mouth/Throat: Oropharynx is clear and moist. She does not have dentures. No oral lesions. No trismus in the jaw. No oropharyngeal exudate.  Surgical sites are healed No postnasal drip  Eyes: EOM are normal. Pupils are equal, round, and reactive to light.  Neck: Normal range of motion. Neck supple.  Cardiovascular: Normal rate, regular rhythm and normal heart sounds.   Pulmonary/Chest: Effort normal and breath sounds normal.  Abdominal: Soft. Bowel sounds are normal. She exhibits no distension. There is no tenderness. There is no rebound and no guarding.  Musculoskeletal: Normal range of motion. She exhibits no edema and no tenderness.  Negative Homan's sign.  No swelling of the hands nor feet  Lymphadenopathy:    She has no cervical adenopathy.  Neurological: She is alert and oriented to person, place, and time. She has normal reflexes. No cranial nerve deficit.  N/V intact  Skin: Skin is warm and dry.  Psychiatric: Judgment normal. Her mood appears anxious. Thought content is delusional.    ED Course  Procedures (including critical care time)  DIAGNOSTIC STUDIES: Oxygen Saturation is 100% on RA, Normal by my interpretation.    COORDINATION OF CARE: 11:48 PM- Will order urinalysis and basic metabolic panel. Discussed treatment plan with pt at bedside and pt agreed to plan.     Labs Review Labs Reviewed - No data to display  Imaging Review No results found.   EKG Interpretation None      MDM   Final diagnoses:  None    There is no swelling of the hands or feet.  EDP called back in to the room as patient now thinks there are bugs in her nose.  EDP explained those were nose hairs and were normal.  Suspect the saline taste in the mouth is from the saline spray the patient is using several times a day for her nasal congestion use only cool mist vaporizer.  No drinking several  gallons of water a day as you can alter your electrolytes.  Patient verbalizes understanding.  Patient is instructed to follow up with her family doctor and therapist regarding her symptoms.    I personally performed the services described in this documentation, which was scribed in my presence. The recorded information has been reviewed and is accurate.    Jasmine AweApril K Khira Cudmore-Rasch, MD 04/17/14 [phone]

## 2014-04-16 NOTE — ED Notes (Signed)
C/o swelling to legs, feet and hands-x 3 days-states she feels like BP is elevated-pt with steady gait to triage

## 2014-04-17 LAB — URINALYSIS, ROUTINE W REFLEX MICROSCOPIC
Bilirubin Urine: NEGATIVE
Glucose, UA: NEGATIVE mg/dL
Hgb urine dipstick: NEGATIVE
KETONES UR: NEGATIVE mg/dL
LEUKOCYTES UA: NEGATIVE
NITRITE: NEGATIVE
PH: 7 (ref 5.0–8.0)
PROTEIN: NEGATIVE mg/dL
Specific Gravity, Urine: 1.003 — ABNORMAL LOW (ref 1.005–1.030)
Urobilinogen, UA: 0.2 mg/dL (ref 0.0–1.0)

## 2014-04-17 NOTE — ED Notes (Signed)
MD at bedside. 

## 2014-04-19 ENCOUNTER — Emergency Department (HOSPITAL_BASED_OUTPATIENT_CLINIC_OR_DEPARTMENT_OTHER)
Admission: EM | Admit: 2014-04-19 | Discharge: 2014-04-19 | Disposition: A | Discharge status: Home / Self Care | Payer: Medicare HMO | Attending: Emergency Medicine | Admitting: Emergency Medicine

## 2014-04-19 ENCOUNTER — Encounter (HOSPITAL_BASED_OUTPATIENT_CLINIC_OR_DEPARTMENT_OTHER): Payer: Self-pay | Admitting: Emergency Medicine

## 2014-04-19 DIAGNOSIS — I10 Essential (primary) hypertension: Secondary | ICD-10-CM | POA: Insufficient documentation

## 2014-04-19 DIAGNOSIS — F329 Major depressive disorder, single episode, unspecified: Secondary | ICD-10-CM | POA: Insufficient documentation

## 2014-04-19 DIAGNOSIS — F3289 Other specified depressive episodes: Secondary | ICD-10-CM | POA: Insufficient documentation

## 2014-04-19 DIAGNOSIS — F410 Panic disorder [episodic paroxysmal anxiety] without agoraphobia: Secondary | ICD-10-CM | POA: Insufficient documentation

## 2014-04-19 DIAGNOSIS — E079 Disorder of thyroid, unspecified: Secondary | ICD-10-CM | POA: Insufficient documentation

## 2014-04-19 DIAGNOSIS — E119 Type 2 diabetes mellitus without complications: Secondary | ICD-10-CM | POA: Insufficient documentation

## 2014-04-19 DIAGNOSIS — E7800 Pure hypercholesterolemia, unspecified: Secondary | ICD-10-CM | POA: Insufficient documentation

## 2014-04-19 DIAGNOSIS — K219 Gastro-esophageal reflux disease without esophagitis: Secondary | ICD-10-CM | POA: Insufficient documentation

## 2014-04-19 DIAGNOSIS — Z79899 Other long term (current) drug therapy: Secondary | ICD-10-CM | POA: Insufficient documentation

## 2014-04-19 DIAGNOSIS — J3089 Other allergic rhinitis: Secondary | ICD-10-CM | POA: Insufficient documentation

## 2014-04-19 DIAGNOSIS — Z91048 Other nonmedicinal substance allergy status: Secondary | ICD-10-CM

## 2014-04-19 NOTE — ED Notes (Signed)
Per EMS:  Pt states she is allergic to the heat, pt then states she smells a chemical smell in her air conditioner.  Pt also states her dog has allergies.

## 2014-04-19 NOTE — ED Notes (Signed)
Pt states her air conditioner was draining black water, and fire department came and checked it, told her that it was mold.  She states there is mold in her house and her allergies are irritated.  She also was having issues with the heat.  She now has a new air conditioner but states she cannot return to her house.

## 2014-04-19 NOTE — ED Provider Notes (Signed)
CSN: [id number]     Arrival date & time 04/19/14  1249 History   First MD Initiated Contact with Patient 04/19/14 1347     Chief Complaint  Patient presents with  . Allergies     (Consider location/radiation/quality/duration/timing/severity/associated sxs/prior Treatment) HPI Comments: Found out yesterday her ac unit was full of mold.  States for the last few weeks she has had some mild SOB, cough, nasal congestion, watery eyes and dizziness while in her home.  She has known mold allergy and when found the mold yesterday replaced [facility] and washed her bedding and clothing but states sx persist and resolve when she leaves the home.  Currently she states she feels much better and back to her normal self.  The history is provided by the patient.    Past Medical History  Diagnosis Date  . Diabetes mellitus   . Hypertension   . Acid reflux   . Thyroid disease   . Depression   . Anxiety   . Panic attacks   . Hypercholesteremia   . CTS (carpal tunnel syndrome)   . Acid reflux   . GERD (gastroesophageal reflux disease)   . TMJ arthralgia    Past Surgical History  Procedure Laterality Date  . Cesarean section    . Abdominal hysterectomy    . Fracture surgery     No family history on file. History  Substance Use Topics  . Smoking status: Never Smoker   . Smokeless tobacco: Not on file  . Alcohol Use: No   OB History   Grav Para Term Preterm Abortions TAB SAB Ect Mult Living                 Review of Systems  All other systems reviewed and are negative.     Allergies  Allegra; Claritin; Codeine; Cortisone; Ibuprofen; Pseudoephedrine hcl; Tylenol; and Zyrtec  Home Medications   Prior to Admission medications   Medication Sig Start Date End Date Taking? Authorizing Provider  clonazePAM (KLONOPIN) 1 MG tablet Take 1 mg by mouth 3 (three) times daily as needed.   Yes Historical Provider, MD  amLODipine-olmesartan (AZOR) 5-20 MG per tablet Take 1 tablet by mouth  daily.    Historical Provider, MD  DULoxetine (CYMBALTA) 60 MG capsule Take 60 mg by mouth 2 (two) times daily.    Historical Provider, MD  fluticasone (VERAMYST) 27.5 MCG/SPRAY nasal spray 2 sprays into each nasal passage daily as needed for congestion. 04/23/12   Cyndra NumbersMeagan Hunt, MD  glipiZIDE (GLUCOTROL XL) 5 MG 24 hr tablet Take 5 mg by mouth daily.    Historical Provider, MD  levothyroxine (SYNTHROID, LEVOTHROID) 50 MCG tablet Take 50 mcg by mouth daily.    Historical Provider, MD  metFORMIN (GLUCOPHAGE) 1000 MG tablet Take 1,000 mg by mouth 2 (two) times daily with a meal.    Historical Provider, MD  pantoprazole (PROTONIX) 40 MG tablet Take 40 mg by mouth daily.    Historical Provider, MD  PRAVASTATIN SODIUM PO Take by mouth.    Historical Provider, MD  traMADol (ULTRAM) 50 MG tablet Take 1 tablet (50 mg total) by mouth every 6 (six) hours as needed for pain. 08/18/13   Elson AreasLeslie K Sofia, PA-C   BP 133/68  Pulse 89  Temp(Src) 98.3 F (36.8 C) (Oral)  Resp 22  Ht 5\' 2"  (1.575 m)  Wt 179 lb (81.194 kg)  BMI 32.73 kg/m2  SpO2 100% Physical Exam  Nursing note and vitals reviewed. Constitutional: She  is oriented to person, place, and time. She appears well-developed and well-nourished. No distress.  HENT:  Head: Normocephalic and atraumatic.  Right Ear: Tympanic membrane and ear canal normal.  Left Ear: Tympanic membrane and ear canal normal.  Nose: Mucosal edema present.  Mouth/Throat: Oropharynx is clear and moist.  Eyes: Conjunctivae and EOM are normal. Pupils are equal, round, and reactive to light.  Neck: Normal range of motion. Neck supple.  Cardiovascular: Normal rate, regular rhythm and intact distal pulses.   No murmur heard. Pulmonary/Chest: Effort normal and breath sounds normal. No respiratory distress. She has no wheezes. She has no rales.  Musculoskeletal: Normal range of motion. She exhibits no edema and no tenderness.  Neurological: She is alert and oriented to person,  place, and time.  Skin: Skin is warm and dry. No rash noted. No erythema.  Psychiatric: She has a normal mood and affect. Her behavior is normal.    ED Course  Procedures (including critical care time) Labs Review Labs Reviewed - No data to display  Imaging Review No results found.   EKG Interpretation None      MDM   Final diagnoses:  Allergy to mold    Patient presents here or nasal congestion, lightheadedness and shortness of breath when she is in her home. Yesterday she discovered that her air conditioning unit was full of mold which is contaminated her home. All of these symptoms occur only when she is at home. Issues she leaves her home her symptoms improve. Patient recently seen 3 days ago and had normal lab testing at that time. Normal exam currently with normal vital signs. Feel most likely the patient is reacting to mold and recommended she stay in a hotel until her home can be sprayed for mold    Gwyneth SproutWhitney Jinan Biggins, MD 04/19/14 1433

## 2015-01-08 ENCOUNTER — Ambulatory Visit (INDEPENDENT_AMBULATORY_CARE_PROVIDER_SITE_OTHER): Payer: Medicare HMO | Admitting: Psychiatry

## 2015-01-08 ENCOUNTER — Encounter (INDEPENDENT_AMBULATORY_CARE_PROVIDER_SITE_OTHER): Payer: Self-pay

## 2015-01-08 ENCOUNTER — Encounter (HOSPITAL_COMMUNITY): Payer: Self-pay | Admitting: Psychiatry

## 2015-01-08 VITALS — BP 137/67 | HR 92 | Ht 62.0 in | Wt 168.0 lb

## 2015-01-08 DIAGNOSIS — F331 Major depressive disorder, recurrent, moderate: Secondary | ICD-10-CM

## 2015-01-08 MED ORDER — LAMOTRIGINE 25 MG PO TABS: ORAL_TABLET | ORAL | Status: AC
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
## 2015-01-29 ENCOUNTER — Ambulatory Visit (HOSPITAL_COMMUNITY): Payer: Self-pay | Admitting: Psychiatry

## 2015-02-10 ENCOUNTER — Ambulatory Visit (HOSPITAL_COMMUNITY): Payer: Self-pay | Admitting: Psychiatry

## 2015-02-12 ENCOUNTER — Ambulatory Visit (HOSPITAL_COMMUNITY): Payer: Self-pay | Admitting: Clinical

## 2015-02-17 ENCOUNTER — Telehealth (HOSPITAL_COMMUNITY): Payer: Self-pay | Admitting: *Deleted

## 2015-02-17 NOTE — Telephone Encounter (Signed)
Called patient to schedule appointment.  Referral from Dr. Dalene CarrowArias. Patient stated she is unable to schedule until she has figured out getting rides to her appointments. Patient stated she will call us back.

## 2016-08-03 ENCOUNTER — Ambulatory Visit (INDEPENDENT_AMBULATORY_CARE_PROVIDER_SITE_OTHER): Payer: Self-pay | Admitting: Neurological Surgery

## 2016-08-24 ENCOUNTER — Ambulatory Visit (INDEPENDENT_AMBULATORY_CARE_PROVIDER_SITE_OTHER): Payer: Commercial Managed Care - PPO | Admitting: Neurological Surgery

## 2016-08-24 DIAGNOSIS — Z6827 Body mass index (BMI) 27.0-27.9, adult: Secondary | ICD-10-CM

## 2016-08-24 DIAGNOSIS — M5020 Other cervical disc displacement, unspecified cervical region: Secondary | ICD-10-CM

## 2016-08-24 DIAGNOSIS — M4802 Spinal stenosis, cervical region: Secondary | ICD-10-CM

## 2016-08-28 ENCOUNTER — Encounter (INDEPENDENT_AMBULATORY_CARE_PROVIDER_SITE_OTHER): Payer: Self-pay | Admitting: Neurological Surgery

## 2016-08-29 DIAGNOSIS — M4802 Spinal stenosis, cervical region: Secondary | ICD-10-CM | POA: Insufficient documentation

## 2016-08-29 DIAGNOSIS — M5020 Other cervical disc displacement, unspecified cervical region: Secondary | ICD-10-CM | POA: Insufficient documentation

## 2016-08-29 NOTE — Progress Notes (Signed)
See dictated note.

## 2016-09-02 NOTE — Letter (Deleted)
PATIENT NAME: Elizabeth Page, Elizabeth Page   HOSPITAL NUMBER:  [id number]  DATE OF SERVICE: 08/24/2016  DATE OF BIRTH:  07/20/1946      September 01, 2016     Reita ClicheAlison MacKinlay MD   [address]   [place], [place] [id number]       Dear Dr. Nicoletta DressMacKinlay:     I saw Elizabeth BloodSusan Page in the [place] office on August 24, 2016.  The patient is 70 years old.  She has been having neck pain and bilateral upper extremity pain.      On examination, she has decreased biceps reflex bilaterally.  Hoffmann sign is negative.  Toes are downgoing.      A cervical spine CT scan was personally reviewed.  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     I have requested an MRI scan of the cervical spine, noncontrast.  We will see the patient back upon completion of that study.     Thank you very much.  If you have any questions, please feel free to call me.     Sincerely,        Jola Baptistonald Brycelynn Stampley, MD  Associate Professor   [facility] Neuro[facility]            CC:   Reita ClicheAlison Mackinlay, MD   [facility]   [address]   [place], [place] [phone]       DD:  09/01/2016 18:21:59  DT:  09/02/2016 11:42:48 SP  D#:  [id number]

## 2016-09-02 NOTE — Letter (Signed)
PATIENT NAME: [name], [name]   HOSPITAL NUMBER:  [id number]  DATE OF SERVICE: 08/24/2016  DATE OF BIRTH:  12/21/1945      September 01, 2016     Alison MacKinlay MD   [address]   [place], [place] [address]       Dear Dr. MacKinlay:     I saw Elizabeth Page in the [place] office on August 24, 2016.  The patient is 70 years old.  She has been having neck pain and bilateral upper extremity pain.      On examination, she has decreased biceps reflex bilaterally.  Hoffmann sign is negative.  Toes are downgoing.      A cervical spine CT scan was personally reviewed.  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     I have requested an MRI scan of the cervical spine, noncontrast.  We will see the patient back upon completion of that study.     Thank you very much.  If you have any questions, please feel free to call me.     Sincerely,        Alandis Bluemel, MD  Associate Professor   [facility]            CC:   Alison Mackinlay, MD   [facility]   [address]   [place], [place] [address]       DD:  09/01/2016 18:21:59  DT:  09/02/2016 11:42:48 SP  D#:  [id number]

## 2016-09-14 ENCOUNTER — Encounter (INDEPENDENT_AMBULATORY_CARE_PROVIDER_SITE_OTHER): Payer: Self-pay | Admitting: Neurological Surgery

## 2016-09-30 ENCOUNTER — Encounter (INDEPENDENT_AMBULATORY_CARE_PROVIDER_SITE_OTHER): Payer: Self-pay | Admitting: Neurological Surgery

## 2016-10-19 ENCOUNTER — Encounter (INDEPENDENT_AMBULATORY_CARE_PROVIDER_SITE_OTHER): Payer: Self-pay | Admitting: Neurological Surgery

## 2016-11-04 ENCOUNTER — Encounter (INDEPENDENT_AMBULATORY_CARE_PROVIDER_SITE_OTHER): Payer: Self-pay | Admitting: Neurological Surgery

## 2016-11-23 ENCOUNTER — Ambulatory Visit (INDEPENDENT_AMBULATORY_CARE_PROVIDER_SITE_OTHER): Payer: Commercial Managed Care - PPO | Admitting: Neurological Surgery

## 2016-11-23 VITALS — HR 82 | Temp 98.1°F | Resp 16 | Ht 62.0 in | Wt 161.0 lb

## 2016-11-23 DIAGNOSIS — Z6829 Body mass index (BMI) 29.0-29.9, adult: Secondary | ICD-10-CM

## 2016-11-23 DIAGNOSIS — M5020 Other cervical disc displacement, unspecified cervical region: Secondary | ICD-10-CM

## 2016-11-23 DIAGNOSIS — M5030 Other cervical disc degeneration, unspecified cervical region: Secondary | ICD-10-CM

## 2016-11-27 ENCOUNTER — Encounter (INDEPENDENT_AMBULATORY_CARE_PROVIDER_SITE_OTHER): Payer: Self-pay | Admitting: Neurological Surgery

## 2016-11-28 DIAGNOSIS — M5030 Other cervical disc degeneration, unspecified cervical region: Secondary | ICD-10-CM | POA: Insufficient documentation

## 2016-11-28 NOTE — Progress Notes (Signed)
See dictated note.

## 2016-12-11 NOTE — Letter (Signed)
PATIENT NAME: Elizabeth Page, Elizabeth Page   HOSPITAL NUMBER:  [id number]  DATE OF SERVICE: 11/23/2016  DATE OF BIRTH:  03/11/1946      December 09, 2016     Reita ClicheAlison Mackinlay, MD   [address]   [place], [place] [id number]     Dear Dr. Nicoletta DressMackinlay:     I saw Elizabeth Page in the [place] office in followup on November 23, 2016.  The patient is 71 years old and was last seen on August 24, 2016, for neck pain.  The patient returns today and continues with similar difficulties.  On examination, motor is 5/5.  Hoffmann sign is negative.  Babinski sign is negative.  Cervical spine MRI scan was personally reviewed.  There is central disk protrusion with annular tear at the C4-C5 level causing mild cord compression.     Although the MRI is impressive, the patient's symptoms and examination do not seem to warrant surgery at this time.  However, given findings on MRI scan, I have recommended routine follow up in 1 month.     Thank you very much.  If you have any questions, please feel free to call me.     Sincerely,        Jola Baptistonald Mattelyn Imhoff, MD  Associate Professor   [facility]            CC:   Reita ClicheAlison Mackinlay, MD   [address]   [place], [place] [id number]       DD:  12/09/2016 17:18:31  DT:  12/11/2016 15:05:05 DG  D#:  [id number]

## 2016-12-21 ENCOUNTER — Encounter (INDEPENDENT_AMBULATORY_CARE_PROVIDER_SITE_OTHER): Payer: Self-pay | Admitting: Neurological Surgery

## 2017-01-27 ENCOUNTER — Encounter (INDEPENDENT_AMBULATORY_CARE_PROVIDER_SITE_OTHER): Payer: Self-pay | Admitting: Neurological Surgery

## 2017-02-03 ENCOUNTER — Encounter (INDEPENDENT_AMBULATORY_CARE_PROVIDER_SITE_OTHER): Payer: Self-pay

## 2017-02-03 ENCOUNTER — Ambulatory Visit (INDEPENDENT_AMBULATORY_CARE_PROVIDER_SITE_OTHER): Payer: Commercial Managed Care - PPO | Admitting: Neurological Surgery

## 2017-02-03 VITALS — HR 92 | Temp 98.3°F | Resp 16 | Ht 62.0 in | Wt 160.0 lb

## 2017-02-03 DIAGNOSIS — M5030 Other cervical disc degeneration, unspecified cervical region: Secondary | ICD-10-CM

## 2017-02-03 DIAGNOSIS — M4802 Spinal stenosis, cervical region: Secondary | ICD-10-CM

## 2017-02-03 DIAGNOSIS — Z6829 Body mass index (BMI) 29.0-29.9, adult: Secondary | ICD-10-CM

## 2017-02-03 DIAGNOSIS — M5020 Other cervical disc displacement, unspecified cervical region: Secondary | ICD-10-CM

## 2017-02-05 ENCOUNTER — Encounter (INDEPENDENT_AMBULATORY_CARE_PROVIDER_SITE_OTHER): Payer: Self-pay | Admitting: Neurological Surgery

## 2017-02-06 NOTE — Progress Notes (Signed)
See dictated note.

## 2017-02-13 NOTE — Letter (Signed)
PATIENT NAME: [name], [name]   HOSPITAL NUMBER:  [id number]  DATE OF SERVICE: 02/03/2017  DATE OF BIRTH:  [birth year]      February 12, 2017     Reita Cliche, MD   [address]   [place], [place] [address]     Dear Dr. Nicoletta Dress:     I saw [name] in the [place] office on February 03, 2017.  The patient is 71 years old.  She was last seen on November 23, 2016, for central disk herniation with annular tear at C4-C5 with some mild cord compression.  Conservative therapy was recommended.  The patient returns today and reports her symptoms are improving.  On examination, motor is 5/5, reflex testing is normal, Hoffmann and Babinski signs are negative.     Although MRI is impressive for central disk herniation and annular tear at C4-C5, the patient's symptoms and examination do not warrant surgery at this time.  We can see the patient back on a p.r.n. basis.  Thank you very much.  If you have any questions, please feel free to call me.     Sincerely,        Jola Baptist, MD  Associate Professor   [facility]            CC:   Reita Cliche, MD   [address]   [place], [place] [address]       DD:  02/12/2017 16:34:57  DT:  02/13/2017 14:53:03 TP  D#:  [id number]

## 2017-03-21 ENCOUNTER — Encounter (INDEPENDENT_AMBULATORY_CARE_PROVIDER_SITE_OTHER): Payer: Self-pay | Admitting: PHYSICIAN ASSISTANT

## 2017-03-21 ENCOUNTER — Ambulatory Visit (INDEPENDENT_AMBULATORY_CARE_PROVIDER_SITE_OTHER): Payer: Commercial Managed Care - PPO | Admitting: PHYSICIAN ASSISTANT

## 2017-03-21 VITALS — BP 124/64 | HR 96 | Temp 98.0°F | Ht 62.0 in | Wt 163.0 lb

## 2017-03-21 DIAGNOSIS — R682 Dry mouth, unspecified: Secondary | ICD-10-CM

## 2017-03-21 DIAGNOSIS — J309 Allergic rhinitis, unspecified: Secondary | ICD-10-CM

## 2017-03-21 DIAGNOSIS — F419 Anxiety disorder, unspecified: Secondary | ICD-10-CM

## 2017-03-21 DIAGNOSIS — R0982 Postnasal drip: Secondary | ICD-10-CM

## 2017-03-21 DIAGNOSIS — F329 Major depressive disorder, single episode, unspecified: Secondary | ICD-10-CM

## 2017-03-21 DIAGNOSIS — R29898 Other symptoms and signs involving the musculoskeletal system: Secondary | ICD-10-CM

## 2017-03-21 DIAGNOSIS — M1990 Unspecified osteoarthritis, unspecified site: Secondary | ICD-10-CM

## 2017-03-21 DIAGNOSIS — E119 Type 2 diabetes mellitus without complications: Secondary | ICD-10-CM

## 2017-03-21 DIAGNOSIS — Z6829 Body mass index (BMI) 29.0-29.9, adult: Secondary | ICD-10-CM

## 2017-03-21 DIAGNOSIS — F32A Depression, unspecified: Secondary | ICD-10-CM

## 2017-03-21 MED ORDER — PANTOPRAZOLE 40 MG TABLET,DELAYED RELEASE
40.0000 mg | DELAYED_RELEASE_TABLET | Freq: Every day | ORAL | 5 refills | Status: AC
Start: 2017-03-21 — End: 2017-04-20

## 2017-03-21 MED ORDER — BLOOD-GLUCOSE METER KIT
PACK | 0 refills | Status: DC
Start: 2017-03-21 — End: 2017-03-23

## 2017-03-23 ENCOUNTER — Other Ambulatory Visit (HOSPITAL_BASED_OUTPATIENT_CLINIC_OR_DEPARTMENT_OTHER): Payer: Self-pay | Admitting: Family Medicine

## 2017-03-23 MED ORDER — BLOOD SUGAR DIAGNOSTIC STRIPS
ORAL_STRIP | 11 refills | Status: AC
Start: 2017-03-23 — End: 2017-03-24

## 2017-03-23 MED ORDER — BLOOD-GLUCOSE METER
0 refills | Status: AC
Start: 2017-03-23 — End: 2017-03-24

## 2017-03-23 MED ORDER — BLOOD GLUCOSE CONTROL, NORMAL SOLUTION
11 refills | Status: AC
Start: 2017-03-23 — End: 2017-03-24

## 2017-03-23 MED ORDER — LANCETS 26 GAUGE
11 refills | Status: AC
Start: 2017-03-23 — End: 2017-03-24

## 2017-03-23 NOTE — Progress Notes (Signed)
[facility]  [address] [place] [address]       Name: Elizabeth Page MRN:  [id number]   Date: 03/21/2017 Age: 71 y.o.         HPI: Elizabeth Page is a 71 y.o. female who presents today with multiple complaints.   Patient reporting chronic arthritis worsening over past few weeks, would like to go back to PT.  Patient also would like her medications refilled.  Patient also let go from PCP, advised she needs another and she needs to follow up with her, will contact Katie Inclan/ [place] for this.  Has not been checking her glucose, would like glucose checked and a glucometer.  Still having issues with anxiety/depression.    History:  Vital signs and history as obtained by clinical staff.  Past Medical History:   Diagnosis Date   . Anxiety    . Depression    . Diabetes mellitus, type 2 ([place])    . GERD (gastroesophageal reflux disease)    . History of concussion    . HTN (hypertension)    . Hyperlipidemia    . Stress    . Ulcerative colitis [facility])          Past Surgical History:   Procedure Laterality Date   . HX CESAREAN SECTION     . HX HYSTERECTOMY           Family Medical History     Problem Relation (Age of Onset)    Other Mother, Father            Social History     Social History   . Marital status: Widowed     Spouse name: N/A   . Number of children: N/A   . Years of education: N/A     Social History Main Topics   . Smoking status: Former Research scientist (life sciences)   . Smokeless tobacco: Never Used   . Alcohol use Not on file   . Drug use: Not on file   . Sexual activity: Not on file     Other Topics Concern   . Not on file     Social History Narrative     Expanded Substance History     Additional history       Allergies:  Allergies   Allergen Reactions   . Allergy-Decongestant [Dexbrompheniramine-Pseudoephed]    . Codeine    . Cortisone    . Motrin [Ibuprofen]      Problem List:  Patient Active Problem List    Diagnosis   . Annular tear of cervical disc   . HNP (herniated nucleus pulposus), cervical   .  Cervical spinal stenosis     Medication:  Outpatient Encounter Prescriptions as of 03/21/2017   Medication Sig Dispense Refill   . Amlodipine-Olmesartan 5-20 mg Oral Tablet      . B COMPLEX-VITAMIN B12 Oral Tablet      . Blood Glucose Control, Normal Solution Use as directed with glucometer. 1 Each 11   . Blood Sugar Diagnostic Strip Fingersticks once daily and as needed 100 Strip 11   . Blood-Glucose Meter Misc Glucometer of choice.  Use as directed 1 Each 0   . Cimetidine (TAGAMET) 400 mg Oral Tablet      . clonazePAM (KLONOPIN) 1 mg Oral Tablet      . cyanocobalamin (VITAMIN B12) 1,000 mcg/mL Injection Solution      . DAILY-VITE Oral Tablet      . DULoxetine (CYMBALTA DR)  30 mg Oral Capsule, Delayed Release(E.C.)      . famotidine (PEPCID) 20 mg Oral Tablet      . fluconazole (DIFLUCAN) 150 mg Oral Tablet      . lancets (UNIVERSAL 1 LANCETS) 26 gauge Misc Fingersticks once daily and as needed. 100 Each 11   . levothyroxine (SYNTHROID) 50 mcg Oral Tablet      . MetFORMIN (GLUCOPHAGE) 1,000 mg Oral Tablet      . pantoprazole (PROTONIX) 40 mg Oral Tablet, Delayed Release (E.C.) Take 1 Tab (40 mg total) by mouth Once a day for 30 days 30 Tab 5   . PROAIR HFA 90 mcg/actuation Inhalation HFA Aerosol Inhaler      . RABEprazole (ACIPHEX) 20 mg Oral Tablet, Delayed Release (E.C.)      . sucralfate (CARAFATE) 1 gram Oral Tablet      . VITAMIN D 50,000 unit Oral Capsule      . [DISCONTINUED] Blood-Glucose Meter (RELION MICRO GLUCOSE MONITOR) Kit Use daily prn for glucose monitoring 1 Kit 0   . [DISCONTINUED] pantoprazole (PROTONIX) 40 mg Oral Tablet, Delayed Release (E.C.)        No facility-administered encounter medications on file as of 03/21/2017.        Review of Systems:  Constitutional: chronically ill, fatigued, no fever, no chills, no night sweats, no weight loss, no loss of appetite.  Neuro: No dizziness. No lightheadedness. No syncope. No hx of seizures.  Eyes: No blurring of vision or diplopia.  ENT: No sore throat.  No dysphagia. No odynophagia. No hearing loss. No tinnitus. No rhinorrhea. No sinus pains. Chronic nasal congestion.  Respiratory: No cough. No shortness of breath. No wheezing. No hemoptysis.  Cardiovascular: No chest pain.  No palpitation. No exertional dyspnea.  Muskuloskeletal: widespread myalgias.  dailyarthralgias.  GI: No abdominal pain. No nausea, vomiting, diarrhea.  GU: No dysuria, frequency of urination.  Skin: No rashes.  Psych: severe anxiety/depression    Exam:  Vital: BP 124/64  Pulse 96  Temp 36.7 C (98 F) (Tympanic)   Ht 1.575 m (_0 )  Wt 73.9 kg (163 lb)  SpO2 98%  Breastfeeding? No  BMI 29.81 kg/m2  General: alert, cooperative, no distress, appears stated age  Eyes: Conjunctivae/corneas clear, PERRLA.  Ears: Left TM and canal normal.  Right TM and canal normal. External ear canals clear  Nose: Nares normal. Mucosa pale boggy  Throat: Pharynx without exudate. No oral lesions.    Lips, mucosa, and tongue normal.  Head - Normocephalic, without obvious abnormality, atraumatic  Neck- supple, symmetrical, trachea midline, no adenopathy  Lungs: clear to auscultation bilaterally. No crackles, no wheezes   Cardiovascular: Heart regular rate and rhythm, no significant murmurs, no carotid bruits  Abdomen: soft, non-tender, non-distended, no hepatosplenomegaly, no masses and no hernias  Extremities: arthritic changes in hands, shoulders, neck, back, knees  Skin: Skin color, texture, turgor normal. No rash.  Lymph nodes: Cervical nodes normal.  Neurologic: alert and oriented x3.     Assessment and Plan:    ICD-10-CM    1. DM (diabetes mellitus) (HCC) E11.9 POCT BLOOD GLUCOSE/FINGERSTICK (AMB)     Blood-Glucose Meter Misc     Blood Sugar Diagnostic Strip     lancets (UNIVERSAL 1 LANCETS) 26 gauge Misc     Blood Glucose Control, Normal Solution     DISCONTINUED: Blood-Glucose Meter (RELION MICRO GLUCOSE MONITOR) Kit   2. Arthritis M19.90    3. Severe muscle deconditioning R29.898    4. Anxiety F41.9  5. Depression, unspecified depression type F32.9    6. Allergic rhinitis J30.9    7. Dry mouth R68.2    8. PND (post-nasal drip) R09.82      Medication Orders   Medications   . DISCONTD: Blood-Glucose Meter (RELION MICRO GLUCOSE MONITOR) Kit     Sig: Use daily prn for glucose monitoring     Dispense:  1 Kit     Refill:  0   . pantoprazole (PROTONIX) 40 mg Oral Tablet, Delayed Release (E.C.)     Sig: Take 1 Tab (40 mg total) by mouth Once a day for 30 days     Dispense:  30 Tab     Refill:  5   . Blood-Glucose Meter Misc     Sig: Glucometer of choice.  Use as directed     Dispense:  1 Each     Refill:  0   . Blood Sugar Diagnostic Strip     Sig: Fingersticks once daily and as needed     Dispense:  100 Strip     Refill:  11   . lancets (UNIVERSAL 1 LANCETS) 26 gauge Misc     Sig: Fingersticks once daily and as needed.     Dispense:  100 Each     Refill:  11   . Blood Glucose Control, Normal Solution     Sig: Use as directed with glucometer.     Dispense:  1 Each     Refill:  11     Referred for new PCP  Will need full workup for this  Refer back to PT  Glucose today in 130s non fasting  Advised to eat and drink as tolerated, patient worried about salt, however getting nutrients back into her is far more beneficial than a lower salt diet  Consider diabetic educator/nutritionist    supportive care discussed  ddx discussed at length  otc measures  medication as directed  recheck as needed  f/u pcp as needed/discussed  ED if worsening    Return if symptoms worsen or fail to improve.    Myrtie Hawk, [place]    Portions of this note may be dictated using voice recognition software or a dictation service. Variances in spelling and vocabulary are possible and unintentional. Not all errors are caught/corrected. Please notify the [facility] if any discrepancies are noted or if the meaning of any statement is not clear.   The patient was seen independently

## 2017-03-23 NOTE — Telephone Encounter (Signed)
Pt no longer pt at this practice

## 2017-03-27 ENCOUNTER — Encounter (INDEPENDENT_AMBULATORY_CARE_PROVIDER_SITE_OTHER): Payer: Self-pay | Admitting: NURSE PRACTITIONER

## 2017-03-27 ENCOUNTER — Ambulatory Visit (INDEPENDENT_AMBULATORY_CARE_PROVIDER_SITE_OTHER): Payer: Commercial Managed Care - PPO | Admitting: NURSE PRACTITIONER

## 2017-03-27 VITALS — BP 112/60 | HR 96 | Temp 97.0°F | Ht 62.5 in | Wt 162.0 lb

## 2017-03-27 DIAGNOSIS — Z6829 Body mass index (BMI) 29.0-29.9, adult: Secondary | ICD-10-CM

## 2017-03-27 DIAGNOSIS — F419 Anxiety disorder, unspecified: Secondary | ICD-10-CM

## 2017-03-27 DIAGNOSIS — N904 Leukoplakia of vulva: Secondary | ICD-10-CM

## 2017-03-27 DIAGNOSIS — L298 Other pruritus: Secondary | ICD-10-CM

## 2017-03-27 DIAGNOSIS — R12 Heartburn: Principal | ICD-10-CM

## 2017-03-27 DIAGNOSIS — N898 Other specified noninflammatory disorders of vagina: Secondary | ICD-10-CM

## 2017-03-27 MED ORDER — NYSTATIN 100,000 UNIT/GRAM TOPICAL CREAM: g | Freq: Two times a day (BID) | CUTANEOUS | 0 refills | 0 days | Status: AC

## 2017-03-27 MED ORDER — DULOXETINE 30 MG CAPSULE,DELAYED RELEASE
30.0000 mg | DELAYED_RELEASE_CAPSULE | Freq: Every day | ORAL | 0 refills | Status: DC
Start: 2017-03-27 — End: 2017-06-07

## 2017-03-27 NOTE — Nursing Note (Signed)
Started 3 months ago. Has blisters on privates parts

## 2017-03-28 NOTE — Progress Notes (Signed)
[facility]  [address]  [place] [id number]       Name: Elizabeth BloodSusan Salsgiver MRN:  [id number]   Date: 03/27/2017 Age: 71 y.o.     Chief Complaint     Yeast Infection               HPI: Elizabeth Page is a 71 y.o. female who presents today with Yeast Infection.   Admits to generalized vaginal itching and has been going on for over 3 months.   Also comes in complaining about acid reflux, he has been meeting her duloxetine refilled.    History:  Vital signs and history as obtained by clinical staff.  Past Medical History:   Diagnosis Date   . Anxiety    . Depression    . Diabetes mellitus, type 2 (HCC)    . GERD (gastroesophageal reflux disease)    . History of concussion    . HTN (hypertension)    . Hyperlipidemia    . Stress    . Ulcerative colitis [facility](HCC)          Past Surgical History:   Procedure Laterality Date   . HX CESAREAN SECTION     . HX HYSTERECTOMY           Family Medical History     Problem Relation (Age of Onset)    Other Mother, Father            Social History     Social History   . Marital status: Widowed     Spouse name: N/A   . Number of children: N/A   . Years of education: N/A     Social History Main Topics   . Smoking status: Never Smoker   . Smokeless tobacco: Never Used   . Alcohol use Not on file   . Drug use: Not on file   . Sexual activity: Not on file     Other Topics Concern   . Not on file     Social History Narrative         Allergies:  Allergies   Allergen Reactions   . Allergy-Decongestant [Dexbrompheniramine-Pseudoephed]    . Codeine    . Cortisone    . Motrin [Ibuprofen]      Problem List:  Patient Active Problem List    Diagnosis   . Annular tear of cervical disc   . HNP (herniated nucleus pulposus), cervical   . Cervical spinal stenosis     Medication:  Outpatient Encounter Prescriptions as of 03/27/2017   Medication Sig Dispense Refill   . Amlodipine-Olmesartan 5-20 mg Oral Tablet      . B COMPLEX-VITAMIN B12 Oral Tablet      . Cimetidine (TAGAMET) 400 mg Oral Tablet       . clonazePAM (KLONOPIN) 1 mg Oral Tablet      . cyanocobalamin (VITAMIN B12) 1,000 mcg/mL Injection Solution      . DAILY-VITE Oral Tablet      . DULoxetine (CYMBALTA DR) 30 mg Oral Capsule, Delayed Release(E.C.) Take 1 Cap (30 mg total) by mouth Once a day for 30 days 30 Cap 0   . famotidine (PEPCID) 20 mg Oral Tablet      . fluconazole (DIFLUCAN) 150 mg Oral Tablet      . levothyroxine (SYNTHROID) 50 mcg Oral Tablet      . MetFORMIN (GLUCOPHAGE) 1,000 mg Oral Tablet      . nystatin (MYCOSTATIN) 100,000 unit/gram Cream by Apply Topically  route Twice daily for 10 days 30 g 0   . pantoprazole (PROTONIX) 40 mg Oral Tablet, Delayed Release (E.C.) Take 1 Tab (40 mg total) by mouth Once a day for 30 days 30 Tab 5   . PROAIR HFA 90 mcg/actuation Inhalation HFA Aerosol Inhaler      . RABEprazole (ACIPHEX) 20 mg Oral Tablet, Delayed Release (E.C.)      . sucralfate (CARAFATE) 1 gram Oral Tablet      . VITAMIN D 50,000 unit Oral Capsule      . [DISCONTINUED] DULoxetine (CYMBALTA DR) 30 mg Oral Capsule, Delayed Release(E.C.)        No facility-administered encounter medications on file as of 03/27/2017.        Review of Systems:  Constitutional: No recent illness, no fever, no chills, no night sweats, no weight loss, no loss of appetite.  Neuro: No dizziness. No lightheadedness. No syncope. No hx of seizures.  Respiratory: No cough. No shortness of breath. No wheezing. No hemoptysis.  Cardiovascular: No chest pain.  No palpitation. No exertional dyspnea.  Muskuloskeletal: No myalgias. No arthralgias.  GI: No abdominal pain. No nausea, vomiting, diarrhea.  Positive for GERD  GU: No dysuria, frequency of urination.  Admits to vaginal itching with no drainage or discharge.  Skin: No rashes.  Psych:   Positive for anxiety and needing medication refilled.    Exam:  Vital: BP 112/60  Pulse 96  Temp 36.1 C (97 F) (Tympanic)   Ht 1.588 m (5' 2.5")  Wt 73.5 kg (162 lb)  SpO2 99%  BMI 29.16 kg/m2  General: alert, cooperative,  no distress, pleasant and happy today  Eyes: Conjunctivae/corneas clear, PERRLA.  Head - Normocephalic, without obvious abnormality, atraumatic  Neck- supple, symmetrical, trachea midline, no adenopathy  Lungs: clear to auscultation bilaterally. No crackles, no wheezes   Cardiovascular: Heart regular rate and rhythm, no significant murmurs, no carotid bruits  Abdomen: soft, non-tender, non-distended, bowel sounds positive x4 quads, no hepatosplenomegaly, no masses and no hernias  Extremities: extremities normal, atraumatic, no cyanosis or edema  Skin: Skin color, texture, turgor normal. No rash.  GU: vulvular white plauques and atrophic skin. Also has some redden irritation along outer aspect   Neurologic: alert and oriented x3.     Assessment and Plan:    ICD-10-CM    1. Heartburn R12    2. Anxiety F41.9    3. Vaginal itching L29.8      Medication Orders   Medications   . DULoxetine (CYMBALTA DR) 30 mg Oral Capsule, Delayed Release(E.C.)     Sig: Take 1 Cap (30 mg total) by mouth Once a day for 30 days     Dispense:  30 Cap     Refill:  0   . nystatin (MYCOSTATIN) 100,000 unit/gram Cream     Sig: by Apply Topically route Twice daily for 10 days     Dispense:  30 g     Refill:  0     Will reorder her duloxetine for her anxiety med.  She is on multiple meds for her acid reflux with no relief.  She tells me that she has been not taking her Pepcid for a long time so I did tell her to restart this twice a day.   She is unsure of what medications that she does take is a poor historian when it comes to medication review. Discussed GERD diet with her.  Will described mycostatin for her vaginal itching and may need  some topical steroids if no improvement for her sclerosis.  Diagnosis discussed and questions answered.  She is to follow up if no improvement, or go to her PCP.      Colvin Caroli, NP    Portions of this note may be dictated using voice recognition software or a dictation service. Variances in spelling and  vocabulary are possible and unintentional. Not all errors are caught/corrected. Please notify the [facility] if any discrepancies are noted or if the meaning of any statement is not clear.   Patient was seen independently

## 2017-04-14 ENCOUNTER — Ambulatory Visit (INDEPENDENT_AMBULATORY_CARE_PROVIDER_SITE_OTHER): Payer: Commercial Managed Care - PPO | Admitting: PHYSICIAN ASSISTANT

## 2017-04-14 ENCOUNTER — Encounter (INDEPENDENT_AMBULATORY_CARE_PROVIDER_SITE_OTHER): Payer: Self-pay | Admitting: PHYSICIAN ASSISTANT

## 2017-04-14 VITALS — BP 112/74 | HR 98 | Temp 97.9°F | Ht 63.0 in | Wt 165.4 lb

## 2017-04-14 DIAGNOSIS — E119 Type 2 diabetes mellitus without complications: Secondary | ICD-10-CM

## 2017-04-14 DIAGNOSIS — F419 Anxiety disorder, unspecified: Secondary | ICD-10-CM

## 2017-04-14 DIAGNOSIS — E039 Hypothyroidism, unspecified: Secondary | ICD-10-CM

## 2017-04-14 DIAGNOSIS — F329 Major depressive disorder, single episode, unspecified: Secondary | ICD-10-CM

## 2017-04-14 DIAGNOSIS — R6889 Other general symptoms and signs: Secondary | ICD-10-CM

## 2017-04-14 DIAGNOSIS — Z6829 Body mass index (BMI) 29.0-29.9, adult: Secondary | ICD-10-CM

## 2017-04-14 DIAGNOSIS — R5381 Other malaise: Secondary | ICD-10-CM

## 2017-04-14 DIAGNOSIS — F32A Depression, unspecified: Secondary | ICD-10-CM

## 2017-04-14 DIAGNOSIS — R5383 Other fatigue: Principal | ICD-10-CM

## 2017-04-15 NOTE — Progress Notes (Signed)
[facility]  [address] [place] [address]       Name: Elizabeth Page MRN:  [id number]   Date: 04/14/2017 Age: 71 y.o.     Chief Complaint     Advice Only               HPI: Elizabeth Page is a 71 y.o. female who presents today with Advice Only about her fatigue, depression, anxiety, and to give me an update on how she is doing in therapy.    History:  Vital signs and history as obtained by clinical staff.  Past Medical History:   Diagnosis Date   . Anxiety    . Depression    . Diabetes mellitus, type 2 (CMS HCC)    . GERD (gastroesophageal reflux disease)    . History of concussion    . HTN (hypertension)    . Hyperlipidemia    . Stress    . Ulcerative colitis (CMS HCC)          Past Surgical History:   Procedure Laterality Date   . HX CESAREAN SECTION     . HX HYSTERECTOMY           Family Medical History     Problem Relation (Age of Onset)    Other Mother, Father            Social History     Social History   . Marital status: Widowed     Spouse name: N/A   . Number of children: N/A   . Years of education: N/A     Social History Main Topics   . Smoking status: Never Smoker   . Smokeless tobacco: Never Used   . Alcohol use Not on file   . Drug use: Not on file   . Sexual activity: Not on file     Other Topics Concern   . Not on file     Social History Narrative     Expanded Substance History     Additional history       Allergies:  Allergies   Allergen Reactions   . Allergy-Decongestant [Dexbrompheniramine-Pseudoephed]    . Codeine    . Cortisone    . Motrin [Ibuprofen]      Problem List:  Patient Active Problem List    Diagnosis   . Annular tear of cervical disc   . HNP (herniated nucleus pulposus), cervical   . Cervical spinal stenosis     Medication:  Outpatient Encounter Prescriptions as of 04/14/2017   Medication Sig Dispense Refill   . Amlodipine-Olmesartan 5-20 mg Oral Tablet      . B COMPLEX-VITAMIN B12 Oral Tablet      . clonazePAM (KLONOPIN) 1 mg Oral Tablet      . cyanocobalamin  (VITAMIN B12) 1,000 mcg/mL Injection Solution      . DAILY-VITE Oral Tablet      . DULoxetine (CYMBALTA DR) 30 mg Oral Capsule, Delayed Release(E.C.) Take 1 Cap (30 mg total) by mouth Once a day for 30 days 30 Cap 0   . famotidine (PEPCID) 20 mg Oral Tablet      . levothyroxine (SYNTHROID) 50 mcg Oral Tablet      . MetFORMIN (GLUCOPHAGE) 1,000 mg Oral Tablet      . pantoprazole (PROTONIX) 40 mg Oral Tablet, Delayed Release (E.C.) Take 1 Tab (40 mg total) by mouth Once a day for 30 days 30 Tab 5   . PROAIR HFA 90 mcg/actuation  Inhalation HFA Aerosol Inhaler      . VITAMIN D 50,000 unit Oral Capsule      . [DISCONTINUED] Cimetidine (TAGAMET) 400 mg Oral Tablet      . [DISCONTINUED] RABEprazole (ACIPHEX) 20 mg Oral Tablet, Delayed Release (E.C.)      . [DISCONTINUED] sucralfate (CARAFATE) 1 gram Oral Tablet        No facility-administered encounter medications on file as of 04/14/2017.        Review of Systems:  Constitutional: No recent illness, no fever, no chills, no night sweats, no weight loss, poor appetite per patient, general malaise and fatigue.  Neuro: No dizziness. No lightheadedness. No syncope. No hx of seizures.  Eyes: No blurring of vision or diplopia.  ENT: No sore throat. No dysphagia. No odynophagia. No hearing loss. No tinnitus. No rhinorrhea. No sinus pains. No nasal congestion.  Respiratory: No cough. No shortness of breath. No wheezing. No hemoptysis.  Cardiovascular: No chest pain.  No palpitation. No exertional dyspnea.  Muskuloskeletal:   Daily body aches and arthralgias  GI: No abdominal pain. No nausea, vomiting, diarrhea.  GU: No dysuria, frequency of urination.  Skin: No rashes.    Exam:  Vital: BP 112/74  Pulse 98  Temp 36.6 C (97.9 F) (Tympanic)   Ht 1.6 m (5\' 3" )  Wt 75 kg (165 lb 6.4 oz)  SpO2 98%  BMI 29.3 kg/m2  General: alert, cooperative, no distress, appears stated age  Eyes: Conjunctivae/corneas clear, PERRLA.  Neck- supple, symmetrical, trachea midline, no adenopathy   Lungs: clear to auscultation bilaterally. No crackles, no wheezes   Cardiovascular: Heart regular rate and rhythm, no significant murmurs, no carotid bruits  Extremities: extremities normal, atraumatic, no cyanosis or edema  Skin: Skin color pale, texture, turgor normal. No rash.    Assessment and Plan:    ICD-10-CM    1. Fatigue, unspecified type R53.83 CBC/DIFF     COMPREHENSIVE METABOLIC PNL, FASTING     THYROID STIMULATING HORMONE WITH FREE T4 REFLEX     HGA1C (HEMOGLOBIN A1C WITH EST AVG GLUCOSE)     URINALYSIS, MACROSCOPIC AND MICROSCOPIC     [facility]    Multifactorial.  Patient states she is much improved since starting therapy.  Continue close monitor, check lab work as discussed.   2. Malaise R53.81 CBC/DIFF     COMPREHENSIVE METABOLIC PNL, FASTING     THYROID STIMULATING HORMONE WITH FREE T4 REFLEX     HGA1C (HEMOGLOBIN A1C WITH EST AVG GLUCOSE)     URINALYSIS, MACROSCOPIC AND MICROSCOPIC     [facility]   3. Depression, unspecified depression type F32.9 CBC/DIFF     COMPREHENSIVE METABOLIC PNL, FASTING     THYROID STIMULATING HORMONE WITH FREE T4 REFLEX     HGA1C (HEMOGLOBIN A1C WITH EST AVG GLUCOSE)     URINALYSIS, MACROSCOPIC AND MICROSCOPIC     [facility]   4. Anxiety F41.9 CBC/DIFF     COMPREHENSIVE METABOLIC PNL, FASTING     THYROID STIMULATING HORMONE WITH FREE T4 REFLEX     HGA1C (HEMOGLOBIN A1C WITH EST AVG GLUCOSE)     URINALYSIS, MACROSCOPIC AND MICROSCOPIC     [facility] ([facility]   5. Hypothyroidism, unspecified type E03.9 CBC/DIFF     COMPREHENSIVE METABOLIC PNL, FASTING     THYROID STIMULATING HORMONE WITH FREE T4 REFLEX  HGA1C (HEMOGLOBIN A1C WITH EST AVG GLUCOSE)     URINALYSIS, MACROSCOPIC AND MICROSCOPIC     Refer to [facility] ([facility])   6. Diabetes (CMS HCC) E11.9 CBC/DIFF     COMPREHENSIVE METABOLIC PNL, FASTING     THYROID  STIMULATING HORMONE WITH FREE T4 REFLEX     HGA1C (HEMOGLOBIN A1C WITH EST AVG GLUCOSE)     URINALYSIS, MACROSCOPIC AND MICROSCOPIC     Refer to [facility] ([facility])   7. Clinical decompensation R68.89 CBC/DIFF     COMPREHENSIVE METABOLIC PNL, FASTING     THYROID STIMULATING HORMONE WITH FREE T4 REFLEX     HGA1C (HEMOGLOBIN A1C WITH EST AVG GLUCOSE)     URINALYSIS, MACROSCOPIC AND MICROSCOPIC     Refer to [facility] ([facility])     Medication Orders   No Medications ordered     Discussed all problems at length.  Fatigue and malaise likely multifactorial in relation to anxiety depression muscle decompensation.  Patient has been doing much better since she has been in physical therapy, would still like to establish with a psychologist/psychiatrist due to ongoing issues.  Patient had tried to get in to see her family doctor before this, she had missed several appointments so she was discharged.  Patient has seen Dr. Alferd Patee in the emergency room and would like to see him as a family doctor, I did talk with their office about this and they will evaluate to accept her.  In the meantime will check CBC CMP hemoglobin A1c urinalysis with microalbumin TSH with reflex.  Patient has had recent vitamin D and does have history of vitamin-D deficiency likely contributing to overt depression and anxiety.  Patient has been on long-term metformin, has had recent folate levels checked which were normal.  Patient also has a history of B12 deficiency which is also up-to-date on lab work and found to be normal.  Patient does have remote history of unclear autoimmune disorder, advised follow-up with family doctor for further evaluation is.  Continue therapy, seems to be helping both mood and muscle decompensation.  Advised to call or recheck if any problems or worsening.  Patient does not need any med refills today.      Juel Burrow, [place]    Portions of this note may be dictated using voice recognition  software or a dictation service. Variances in spelling and vocabulary are possible and unintentional. Not all errors are caught/corrected. Please notify the [facility] Parkin if any discrepancies are noted or if the meaning of any statement is not clear.   Patient seen independently

## 2017-04-23 ENCOUNTER — Other Ambulatory Visit (HOSPITAL_BASED_OUTPATIENT_CLINIC_OR_DEPARTMENT_OTHER): Payer: Self-pay | Admitting: Family Medicine

## 2017-04-25 ENCOUNTER — Ambulatory Visit (HOSPITAL_BASED_OUTPATIENT_CLINIC_OR_DEPARTMENT_OTHER): Payer: Self-pay | Admitting: Emergency Medicine

## 2017-04-25 ENCOUNTER — Other Ambulatory Visit (HOSPITAL_BASED_OUTPATIENT_CLINIC_OR_DEPARTMENT_OTHER): Payer: Self-pay | Admitting: Family Medicine

## 2017-05-02 ENCOUNTER — Ambulatory Visit (INDEPENDENT_AMBULATORY_CARE_PROVIDER_SITE_OTHER): Payer: Commercial Managed Care - PPO | Admitting: PHYSICIAN ASSISTANT

## 2017-05-02 ENCOUNTER — Encounter (INDEPENDENT_AMBULATORY_CARE_PROVIDER_SITE_OTHER): Payer: Self-pay | Admitting: PHYSICIAN ASSISTANT

## 2017-05-02 VITALS — BP 112/62 | HR 114 | Temp 98.4°F | Ht 62.0 in | Wt 163.0 lb

## 2017-05-02 DIAGNOSIS — F329 Major depressive disorder, single episode, unspecified: Secondary | ICD-10-CM

## 2017-05-02 DIAGNOSIS — M1990 Unspecified osteoarthritis, unspecified site: Secondary | ICD-10-CM

## 2017-05-02 DIAGNOSIS — R6889 Other general symptoms and signs: Secondary | ICD-10-CM

## 2017-05-02 DIAGNOSIS — R5383 Other fatigue: Secondary | ICD-10-CM

## 2017-05-02 DIAGNOSIS — Z6829 Body mass index (BMI) 29.0-29.9, adult: Secondary | ICD-10-CM

## 2017-05-02 DIAGNOSIS — R454 Irritability and anger: Secondary | ICD-10-CM

## 2017-05-02 DIAGNOSIS — F32A Depression, unspecified: Secondary | ICD-10-CM

## 2017-05-02 DIAGNOSIS — F419 Anxiety disorder, unspecified: Principal | ICD-10-CM

## 2017-05-05 NOTE — Progress Notes (Signed)
[facility]  [address] [place] [address]       Name: Alonnie Bieker MRN:  [id number]   Date: 05/02/2017 Age: 71 y.o.     Chief Complaint     Multiple medical problems follow up               HPI: Elizabeth Page is a 71 y.o. female who presents today with Multiple medical problems follow up      History:  Vital signs and history as obtained by clinical staff.  Past Medical History:   Diagnosis Date   . Anxiety    . Depression    . Diabetes mellitus, type 2 (CMS HCC)    . GERD (gastroesophageal reflux disease)    . History of concussion    . HTN (hypertension)    . Hyperlipidemia    . Stress    . Ulcerative colitis (CMS HCC)          Past Surgical History:   Procedure Laterality Date   . HX CESAREAN SECTION     . HX HYSTERECTOMY           Family Medical History     Problem Relation (Age of Onset)    Other Mother, Father            Social History     Social History   . Marital status: Widowed     Spouse name: N/A   . Number of children: N/A   . Years of education: N/A     Social History Main Topics   . Smoking status: Never Smoker   . Smokeless tobacco: Never Used   . Alcohol use Not on file   . Drug use: Not on file   . Sexual activity: Not on file     Other Topics Concern   . Not on file     Social History Narrative     Expanded Substance History     Additional history       Allergies:  Allergies   Allergen Reactions   . Allergy-Decongestant [Dexbrompheniramine-Pseudoephed]    . Codeine    . Cortisone    . Motrin [Ibuprofen]      Problem List:  Patient Active Problem List    Diagnosis   . Annular tear of cervical disc   . HNP (herniated nucleus pulposus), cervical   . Cervical spinal stenosis     Medication:  Outpatient Encounter Prescriptions as of 05/02/2017   Medication Sig Dispense Refill   . Amlodipine-Olmesartan 5-20 mg Oral Tablet      . B COMPLEX-VITAMIN B12 Oral Tablet      . clonazePAM (KLONOPIN) 1 mg Oral Tablet      . cyanocobalamin (VITAMIN B12) 1,000 mcg/mL Injection Solution      .  DAILY-VITE Oral Tablet      . DULoxetine (CYMBALTA DR) 30 mg Oral Capsule, Delayed Release(E.C.) Take 1 Cap (30 mg total) by mouth Once a day for 30 days 30 Cap 0   . famotidine (PEPCID) 20 mg Oral Tablet      . levothyroxine (SYNTHROID) 50 mcg Oral Tablet      . MetFORMIN (GLUCOPHAGE) 1,000 mg Oral Tablet TAKE ONE TABLET BY MOUTH TWICE A DAY 60 Tab 1   . PROAIR HFA 90 mcg/actuation Inhalation HFA Aerosol Inhaler      . VITAMIN D 50,000 unit Oral Capsule TAKE ONE CAPSULE BY MOUTH ONCE WEEKLY 12 Cap 0     No  facility-administered encounter medications on file as of 05/02/2017.        Review of Systems:  Constitutional: No recent illness, no fever, no chills, no night sweats, no weight loss, no loss of appetite.  Neuro: No dizziness. No lightheadedness. No syncope. No hx of seizures.  Eyes: No blurring of vision or diplopia.  ENT: No sore throat. No dysphagia. No odynophagia. No hearing loss. No tinnitus. No rhinorrhea. No sinus pains. No nasal congestion.  Respiratory: No cough. No shortness of breath. No wheezing. No hemoptysis.  Cardiovascular: No chest pain.  No palpitation. No exertional dyspnea.  Muskuloskeletal: No myalgias. No arthralgias.  GI: No abdominal pain. No nausea, vomiting, diarrhea.  GU: No dysuria, frequency of urination.  Skin: No rashes.    Exam:  Vital: BP 112/62  Pulse (!) 114  Temp 36.9 C (98.4 F) (Tympanic)   Ht 1.575 m (5\' 2" )  Wt 73.9 kg (163 lb)  SpO2 96%  BMI 29.81 kg/m2  General: alert, cooperative, no distress, appears stated age  Eyes: Conjunctivae/corneas clear, PERRLA.  Ears: Left TM and canal normal.  Right TM and canal normal. External ear canals clear  Nose: Nares normal. Mucosa normal.  Throat: Pharynx without exudate. No oral lesions.    Lips, mucosa, and tongue normal.  Head - Normocephalic, without obvious abnormality, atraumatic  Neck- supple, symmetrical, trachea midline, no adenopathy  Lungs: clear to auscultation bilaterally. No crackles, no wheezes    Cardiovascular: Heart regular rate and rhythm, no significant murmurs, no carotid bruits  Abdomen: soft, non-tender, non-distended, no hepatosplenomegaly, no masses and no hernias  Extremities: extremities normal, atraumatic, no cyanosis or edema  Skin: Skin color, texture, turgor normal. No rash.  Lymph nodes: Cervical nodes normal.  Neurologic: alert and oriented x3.     Assessment and Plan:    ICD-10-CM    1. Anxiety F41.9    2. Depression, unspecified depression type F32.9    3. Clinical decompensation R68.89    4. Fatigue, unspecified type R53.83    5. Arthritis M19.90    6. Difficulty controlling anger R45.4     I did discuss with potential new family doctor about  Ms. Norva Pavlovdgar in concerning all of her problems as I did last month.  Patient doing much better emotionally and physically being in physical therapy.  Patient with still a counselor at this time.  I have advised her to bring this up with family doctor.  Patient still having issues with her acid reflux also was awaiting a gastroenterology referral from her old family doctor before she was discharge from not showing up.  I did reiterate everything that we talked about last office visit and that noted below.Discussed all problems at length.  Fatigue and malaise likely multifactorial in relation to anxiety depression muscle decompensation.  Patient has been doing much better since she has been in physical therapy, would still like to establish with a psychologist/psychiatrist due to ongoing issues.  Patient had tried to get in to see her family doctor before this, she had missed several appointments so she was discharged.  Patient has seen Dr. Alferd Pateeichmond in the emergency room and would like to see him as a family doctor, I did talk with their office about this and they will evaluate to accept her.  In the meantime will check CBC CMP hemoglobin A1c urinalysis with microalbumin TSH with reflex.  Patient has had recent vitamin D and does have history of  vitamin-D deficiency likely contributing to overt depression and  anxiety.  Patient has been on long-term metformin, has had recent folate levels checked which were normal.  Patient also has a history of B12 deficiency which is also up-to-date on lab work and found to be normal.  Patient does have remote history of unclear autoimmune disorder, advised follow-up with family doctor for further evaluation is.  Continue therapy, seems to be helping both mood and muscle decompensation.  Patient has not got lab work yet, advised to get this.  Advised to call or recheck if any problems or worsening.  Patient does not need any med refills today.        Juel Burrow, [place]    Portions of this note may be dictated using voice recognition software or a dictation service. Variances in spelling and vocabulary are possible and unintentional. Not all errors are caught/corrected. Please notify the [facility] Park[facility] if any discrepancies are noted or if the meaning of any statement is not clear.   Patient seen independently

## 2017-05-22 ENCOUNTER — Other Ambulatory Visit (HOSPITAL_BASED_OUTPATIENT_CLINIC_OR_DEPARTMENT_OTHER): Payer: Self-pay | Admitting: Family Medicine

## 2017-05-25 ENCOUNTER — Ambulatory Visit (INDEPENDENT_AMBULATORY_CARE_PROVIDER_SITE_OTHER): Payer: Commercial Managed Care - PPO | Admitting: PHYSICIAN ASSISTANT

## 2017-05-25 VITALS — BP 120/56 | HR 104 | Temp 97.8°F | Ht 62.0 in | Wt 162.0 lb

## 2017-05-25 DIAGNOSIS — R5383 Other fatigue: Secondary | ICD-10-CM

## 2017-05-25 DIAGNOSIS — F32A Depression, unspecified: Secondary | ICD-10-CM

## 2017-05-25 DIAGNOSIS — F329 Major depressive disorder, single episode, unspecified: Secondary | ICD-10-CM

## 2017-05-25 DIAGNOSIS — R454 Irritability and anger: Secondary | ICD-10-CM

## 2017-05-25 DIAGNOSIS — F419 Anxiety disorder, unspecified: Secondary | ICD-10-CM

## 2017-05-25 DIAGNOSIS — R630 Anorexia: Secondary | ICD-10-CM

## 2017-05-25 DIAGNOSIS — E119 Type 2 diabetes mellitus without complications: Secondary | ICD-10-CM

## 2017-05-25 DIAGNOSIS — Z6829 Body mass index (BMI) 29.0-29.9, adult: Secondary | ICD-10-CM

## 2017-05-25 DIAGNOSIS — E039 Hypothyroidism, unspecified: Secondary | ICD-10-CM

## 2017-05-25 DIAGNOSIS — R5381 Other malaise: Secondary | ICD-10-CM

## 2017-05-25 DIAGNOSIS — J309 Allergic rhinitis, unspecified: Secondary | ICD-10-CM

## 2017-05-25 DIAGNOSIS — R6889 Other general symptoms and signs: Secondary | ICD-10-CM

## 2017-05-25 MED ORDER — LEVOTHYROXINE 50 MCG TABLET
50.0000 ug | ORAL_TABLET | Freq: Every morning | ORAL | 5 refills | Status: DC
Start: 2017-05-25 — End: 2017-12-06

## 2017-05-27 ENCOUNTER — Ambulatory Visit: Payer: Commercial Managed Care - PPO | Attending: Family Medicine

## 2017-05-27 DIAGNOSIS — R5383 Other fatigue: Secondary | ICD-10-CM | POA: Insufficient documentation

## 2017-05-27 DIAGNOSIS — E039 Hypothyroidism, unspecified: Secondary | ICD-10-CM | POA: Insufficient documentation

## 2017-05-27 DIAGNOSIS — F419 Anxiety disorder, unspecified: Secondary | ICD-10-CM

## 2017-05-27 DIAGNOSIS — F329 Major depressive disorder, single episode, unspecified: Secondary | ICD-10-CM | POA: Insufficient documentation

## 2017-05-27 DIAGNOSIS — F32A Depression, unspecified: Secondary | ICD-10-CM

## 2017-05-27 DIAGNOSIS — R5381 Other malaise: Secondary | ICD-10-CM | POA: Insufficient documentation

## 2017-05-27 DIAGNOSIS — E119 Type 2 diabetes mellitus without complications: Secondary | ICD-10-CM | POA: Insufficient documentation

## 2017-05-27 DIAGNOSIS — R6889 Other general symptoms and signs: Secondary | ICD-10-CM | POA: Insufficient documentation

## 2017-05-27 LAB — COMPREHENSIVE METABOLIC PNL, FASTING
ALBUMIN: 4 g/dL (ref 3.5–4.8)
ALKALINE PHOSPHATASE: 55 U/L (ref 38–126)
ALT (SGPT): 16 U/L (ref 14–54)
ANION GAP: 9 mmol/L (ref 4–13)
AST (SGOT): 22 U/L (ref 15–41)
BILIRUBIN TOTAL: 0.7 mg/dL (ref 0.2–2.0)
BUN: 18 mg/dL (ref 8–20)
CALCIUM: 9.3 mg/dL (ref 8.9–10.3)
CHLORIDE: 101 mmol/L (ref 101–111)
CO2 TOTAL: 26 mmol/L (ref 22–32)
CREATININE: 0.95 mg/dL (ref 0.40–1.00)
ESTIMATED GFR: 58 mL/min/{1.73_m2} — ABNORMAL LOW (ref >60)
GLUCOSE: 118 mg/dL — ABNORMAL HIGH (ref 70–99)
POTASSIUM: 4.3 mmol/L (ref 3.6–5.1)
PROTEIN TOTAL: 7.2 g/dL (ref 6.1–7.9)
SODIUM: 136 mmol/L (ref 135–145)

## 2017-05-27 LAB — URINALYSIS, MACROSCOPIC
BILIRUBIN: NEGATIVE mg/dL
BLOOD: NEGATIVE mg/dL
GLUCOSE: NEGATIVE mg/dL
KETONES: NEGATIVE mg/dL
NITRITE: NEGATIVE
PH: 6 (ref 4.5–8.0)
PROTEIN: NEGATIVE mg/dL
SPECIFIC GRAVITY: 1.01 (ref 1.001–1.035)

## 2017-05-27 LAB — URINALYSIS, MICROSCOPIC

## 2017-05-27 LAB — CBC WITH DIFF
BASOPHIL #: 0.1 10*3/uL (ref 0.00–0.30)
BASOPHIL %: 1 % (ref 0–1)
EOSINOPHIL #: 0.1 10*3/uL (ref 0.00–0.50)
EOSINOPHIL %: 3 % (ref 0–4)
HCT: 36.6 % — ABNORMAL LOW (ref 37.0–47.0)
HGB: 12.2 g/dL — ABNORMAL LOW (ref 12.5–16.0)
LYMPHOCYTE #: 1.8 10*3/uL (ref 0.90–2.90)
LYMPHOCYTE %: 39 % — ABNORMAL HIGH (ref 23–35)
MCH: 26.9 pg — ABNORMAL LOW (ref 28.0–33.0)
MCHC: 33.3 g/dL (ref 32.0–37.0)
MCV: 80.9 fL (ref 78.0–100.0)
MONOCYTE #: 0.4 10*3/uL (ref 0.30–0.90)
MONOCYTE %: 9 % (ref 0–12)
NEUTROPHIL #: 2.2 10*3/uL (ref 1.70–7.00)
NEUTROPHIL %: 49 % — ABNORMAL LOW (ref 50–70)
PLATELETS: 223 10*3/uL (ref 130–400)
RBC: 4.52 10*6/uL (ref 4.20–5.40)
RDW: 13.9 % (ref 9.9–16.5)
WBC: 4.6 10*3/uL (ref 4.5–11.0)

## 2017-05-27 LAB — HGA1C (HEMOGLOBIN A1C WITH EST AVG GLUCOSE)
ESTIMATED AVERAGE GLUCOSE: 140 mg/dL (ref 0–153)
HEMOGLOBIN A1C: 6.5 % (ref <=7.0)

## 2017-05-27 LAB — THYROID STIMULATING HORMONE WITH FREE T4 REFLEX: TSH: 3.25 u[IU]/mL (ref 0.340–5.330)

## 2017-05-29 NOTE — Progress Notes (Signed)
[facility]  [address]  [place] [id number]       Name: Elizabeth BloodSusan Page MRN:  [id number]   Date: 05/25/2017 Age: 71 y.o.     Chief Complaint     Fatigue fatigued and losing weight          HPI: Elizabeth BloodSusan Page is a 71 y.o. female who presents today with Fatigue (fatigued and losing weight).  Patient has not tried any new dietary changes or foods since last office visit we discussed this.  Patient feels like she has used losing weight, but is more worried about her body composition than actual weight loss that she feels like she still losing muscle.  She does state that the physical therapy that she goes to helps with this not only physically but mentally and emotionally.  Patient did miss her 1st it office appointment with her new PCP, I did contact her new PCP and advised to give her another chance.  I also gave her Ensure to try last causes a, but she has not tried it due to the chocolate flavoring as she says she cannot tolerate it and did not want tries she states it upsets her acid reflux.  She reports she is doing much better with her anxiety and depression with symptoms, but is still having the occasional outburst and gets upset easily.  Patient states that it is much better for her when she does have the physical therapy and she feels like the more active she is the more she improves in all regards.  No other remitting factors or associated symptoms at this time.  Patient was given lab work over 2 months ago and is still yet to get it done.  History:  Vital signs and history as obtained by clinical staff.  Past Medical History:   Diagnosis Date   . Anxiety    . Depression    . Diabetes mellitus, type 2 (CMS HCC)    . GERD (gastroesophageal reflux disease)    . History of concussion    . HTN (hypertension)    . Hyperlipidemia    . Stress    . Ulcerative colitis (CMS HCC)          Past Surgical History:   Procedure Laterality Date   . HX CESAREAN SECTION     . HX HYSTERECTOMY           Family  Medical History     Problem Relation (Age of Onset)    Other Mother, Father            Social History     Social History   . Marital status: Widowed     Spouse name: N/A   . Number of children: N/A   . Years of education: N/A     Social History Main Topics   . Smoking status: Never Smoker   . Smokeless tobacco: Never Used   . Alcohol use Not on file   . Drug use: Not on file   . Sexual activity: Not on file     Other Topics Concern   . Not on file     Social History Narrative     Expanded Substance History     Additional history       Allergies:  Allergies   Allergen Reactions   . Allergy-Decongestant [Dexbrompheniramine-Pseudoephed]    . Codeine    . Cortisone    . Motrin [Ibuprofen]      Problem List:  Patient Active Problem List    Diagnosis   . Annular tear of cervical disc   . HNP (herniated nucleus pulposus), cervical   . Cervical spinal stenosis     Medication:  Outpatient Encounter Prescriptions as of 05/25/2017   Medication Sig Dispense Refill   . Amlodipine-Olmesartan 5-20 mg Oral Tablet      . B COMPLEX-VITAMIN B12 Oral Tablet      . clonazePAM (KLONOPIN) 1 mg Oral Tablet      . cyanocobalamin (VITAMIN B12) 1,000 mcg/mL Injection Solution      . DAILY-VITE Oral Tablet      . DULoxetine (CYMBALTA DR) 30 mg Oral Capsule, Delayed Release(E.C.) Take 1 Cap (30 mg total) by mouth Once a day for 30 days 30 Cap 0   . famotidine (PEPCID) 20 mg Oral Tablet      . levothyroxine (SYNTHROID) 50 mcg Oral Tablet Take 1 Tab (50 mcg total) by mouth Every morning for 30 days 30 Tab 5   . MetFORMIN (GLUCOPHAGE) 1,000 mg Oral Tablet TAKE ONE TABLET BY MOUTH TWICE A DAY 60 Tab 1   . PROAIR HFA 90 mcg/actuation Inhalation HFA Aerosol Inhaler      . VITAMIN D 50,000 unit Oral Capsule TAKE ONE CAPSULE BY MOUTH ONCE WEEKLY 12 Cap 0   . [DISCONTINUED] levothyroxine (SYNTHROID) 50 mcg Oral Tablet        No facility-administered encounter medications on file as of 05/25/2017.        Review of Systems:  All pertinent positives for this  visit covered in HPI.  Rest of the review systems negative.    Exam:  Vital: BP (!) 120/56  Pulse (!) 104  Temp 36.6 C (97.8 F) (Tympanic)   Ht 1.575 m (5\' 2" )  Wt 73.5 kg (162 lb)  SpO2 96%  BMI 29.63 kg/m2  General: alert, cooperative, no distress, appears stated age  Eyes: Conjunctivae/corneas clear, PERRLA.  Ears: Left TM and canal normal.  Right TM and canal normal. External ear canals clear  Nose: Nares pale boggy.  Lungs: clear to auscultation bilaterally. No crackles, no wheezes   Cardiovascular: Heart regular rate and rhythm, no significant murmurs, no carotid bruits  Abdomen: soft, non-tender, non-distended, no hepatosplenomegaly, no masses and no hernias  Extremities: extremities normal, atraumatic, no cyanosis or edema  Skin: Skin color, texture, turgor normal. No rash.  Neurologic: alert and oriented x3.     Assessment and Plan:    ICD-10-CM    1. Hypothyroidism, unspecified type E03.9 levothyroxine (SYNTHROID) 50 mcg Oral Tablet     Medication Orders   Medications   . levothyroxine (SYNTHROID) 50 mcg Oral Tablet     Sig: Take 1 Tab (50 mcg total) by mouth Every morning for 30 days     Dispense:  30 Tab     Refill:  5       I did discuss with potential new family doctor about  Elizabeth Page in concerning all of her problems as I did last month.    Patient doing much better emotionally and physically being in physical therapy.    Patient with still a counselor at this time.  I have advised her to bring this up with family doctor.    Patient still having issues with her acid reflux also was awaiting a gastroenterology referral from her old family doctor before she was discharge from not showing up.  I did reiterate everything that we talked about last office visit and that noted  below.Discussed all problems at length.  Fatigue and malaise likely multifactorial in relation to anxiety depression muscle decompensation.  Patient also having difficulty performing basic ADLs.   Last winter was especially  top on patient, and I did discuss with her other possibilities and to consider assisted living due to issues with her living space and inability to perform set ADLs for her better and continued health, advised to bring up with PCP.  Patient has been doing much better since she has been in physical therapy, would still like to establish with a psychologist/psychiatrist due to ongoing issues.  Patient had tried to get in to see her family doctor before this, she had missed several appointments so she was discharged.  Patient has seen Dr. Alferd Patee in the emergency room and would like to see him as a family doctor, I did talk with their office about this and they will evaluate to accept her even after missing her 1st appointment.  Patient has been on long-term metformin, has had recent folate levels checked which were normal. Patient also has a history of B12 deficiency which is also up-to-date on lab work and found to be normal.  Continue therapy, seems to be helping both mood and muscle decompensation.  Patient has not got lab work yet, advised to get this.  Advised to call or recheck if any problems or worsening.    Juel Burrow, [place]    Portions of this note may be dictated using voice recognition software or a dictation service. Variances in spelling and vocabulary are possible and unintentional. Not all errors are caught/corrected. Please notify the [facility] Park[facility] if any discrepancies are noted or if the meaning of any statement is not clear.   Patient seen independently

## 2017-05-31 ENCOUNTER — Telehealth (INDEPENDENT_AMBULATORY_CARE_PROVIDER_SITE_OTHER): Payer: Self-pay | Admitting: PHYSICIAN ASSISTANT

## 2017-05-31 ENCOUNTER — Other Ambulatory Visit (INDEPENDENT_AMBULATORY_CARE_PROVIDER_SITE_OTHER): Payer: Self-pay | Admitting: PHYSICIAN ASSISTANT

## 2017-05-31 DIAGNOSIS — E119 Type 2 diabetes mellitus without complications: Secondary | ICD-10-CM

## 2017-05-31 MED ORDER — METFORMIN ER 750 MG TABLET,EXTENDED RELEASE 24 HR
750.00 mg | ORAL_TABLET | Freq: Two times a day (BID) | ORAL | 4 refills | Status: DC
Start: 2017-05-31 — End: 2017-07-01

## 2017-05-31 MED ORDER — FOOD SUPPLEMENT, LACTOSE-REDUCED ORAL LIQUID: Package | ORAL | 5 refills | 0 days | Status: DC

## 2017-05-31 NOTE — Progress Notes (Signed)
Discussed lab work on phone with patient at length.  A1c 6.5 in well controlled.  Thyroid normal.  Rest lab work unremarkable at this time.  Patient states she feels like her depression is getting worse.  Had recent vitamin-D which was normal.  Also having worsening issues with her acid reflux, will do trial decrease her metformin 750 mg b.i.d. from 2000 mg daily and continue to eat and drink as tolerated and see if that improves his symptoms.  Patient is has to follow up with new family doctor to consider for counseling.  And due to rapid decline of ADL as we have talked at length over several months about possibly assisted living.  Advised to bring this up with PCP.    Patient seen independently

## 2017-06-06 ENCOUNTER — Encounter (INDEPENDENT_AMBULATORY_CARE_PROVIDER_SITE_OTHER): Payer: Self-pay | Admitting: PHYSICIAN ASSISTANT

## 2017-06-06 ENCOUNTER — Ambulatory Visit (INDEPENDENT_AMBULATORY_CARE_PROVIDER_SITE_OTHER): Payer: Commercial Managed Care - PPO | Admitting: PHYSICIAN ASSISTANT

## 2017-06-06 VITALS — BP 100/60 | HR 93 | Temp 98.3°F | Ht 62.0 in | Wt 162.0 lb

## 2017-06-06 DIAGNOSIS — R29898 Other symptoms and signs involving the musculoskeletal system: Secondary | ICD-10-CM

## 2017-06-06 DIAGNOSIS — Z6829 Body mass index (BMI) 29.0-29.9, adult: Secondary | ICD-10-CM

## 2017-06-06 DIAGNOSIS — E039 Hypothyroidism, unspecified: Secondary | ICD-10-CM

## 2017-06-06 DIAGNOSIS — R682 Dry mouth, unspecified: Secondary | ICD-10-CM

## 2017-06-06 DIAGNOSIS — R6889 Other general symptoms and signs: Secondary | ICD-10-CM

## 2017-06-06 DIAGNOSIS — R454 Irritability and anger: Secondary | ICD-10-CM

## 2017-06-06 DIAGNOSIS — F329 Major depressive disorder, single episode, unspecified: Secondary | ICD-10-CM

## 2017-06-06 DIAGNOSIS — E119 Type 2 diabetes mellitus without complications: Secondary | ICD-10-CM

## 2017-06-06 DIAGNOSIS — M549 Dorsalgia, unspecified: Secondary | ICD-10-CM

## 2017-06-06 DIAGNOSIS — J309 Allergic rhinitis, unspecified: Secondary | ICD-10-CM

## 2017-06-06 DIAGNOSIS — R0982 Postnasal drip: Secondary | ICD-10-CM

## 2017-06-06 DIAGNOSIS — K219 Gastro-esophageal reflux disease without esophagitis: Secondary | ICD-10-CM

## 2017-06-06 DIAGNOSIS — R5381 Other malaise: Secondary | ICD-10-CM

## 2017-06-06 DIAGNOSIS — F32A Depression, unspecified: Secondary | ICD-10-CM

## 2017-06-06 DIAGNOSIS — R5383 Other fatigue: Secondary | ICD-10-CM

## 2017-06-07 ENCOUNTER — Ambulatory Visit: Payer: Commercial Managed Care - PPO | Attending: PHYSICIAN ASSISTANT | Admitting: PHYSICIAN ASSISTANT

## 2017-06-07 ENCOUNTER — Encounter (HOSPITAL_BASED_OUTPATIENT_CLINIC_OR_DEPARTMENT_OTHER): Payer: Self-pay | Admitting: PHYSICIAN ASSISTANT

## 2017-06-07 VITALS — BP 102/64 | HR 107 | Temp 97.1°F | Resp 16 | Ht 62.0 in | Wt 162.0 lb

## 2017-06-07 DIAGNOSIS — Z6829 Body mass index (BMI) 29.0-29.9, adult: Secondary | ICD-10-CM

## 2017-06-07 DIAGNOSIS — K219 Gastro-esophageal reflux disease without esophagitis: Secondary | ICD-10-CM

## 2017-06-07 DIAGNOSIS — F411 Generalized anxiety disorder: Secondary | ICD-10-CM | POA: Insufficient documentation

## 2017-06-07 DIAGNOSIS — E119 Type 2 diabetes mellitus without complications: Secondary | ICD-10-CM

## 2017-06-07 DIAGNOSIS — E782 Mixed hyperlipidemia: Secondary | ICD-10-CM | POA: Insufficient documentation

## 2017-06-07 DIAGNOSIS — H269 Unspecified cataract: Secondary | ICD-10-CM | POA: Insufficient documentation

## 2017-06-07 DIAGNOSIS — E039 Hypothyroidism, unspecified: Secondary | ICD-10-CM

## 2017-06-07 MED ORDER — LORAZEPAM 1 MG TABLET
1.00 mg | ORAL_TABLET | Freq: Two times a day (BID) | ORAL | 0 refills | Status: DC | PRN
Start: 2017-06-07 — End: 2017-07-07

## 2017-06-07 MED ORDER — DULOXETINE 60 MG CAPSULE,DELAYED RELEASE
60.0000 mg | DELAYED_RELEASE_CAPSULE | Freq: Every day | ORAL | 5 refills | Status: DC
Start: 2017-06-07 — End: 2017-10-21

## 2017-06-07 NOTE — Progress Notes (Signed)
[facility]  [address] [place] [address]       Name: Elizabeth Page MRN:  [id number]   Date: 06/06/2017 Age: 71 y.o.     Chief Complaint     Abdominal Pain Has not been eating right she her stomach is painful.    Back Pain For a few days now her neck is hurting clear down to lowback    Neck Pain               HPI: Elizabeth Page is a 71 y.o. female who presents today with multiple complaints at this time.  Patient to see new family doctor in the morning.  Patient states that her conditions have not changed too much, she still experiencing acid reflux pain despite being on Protonix and Pepcid b.i.d..  She would also like to be referred some more for her cataracts.  She states her depression and anxiety still continue to wax and wane on her and she cries every Sunday at the very least.  Patient feels much better as she exercise in the gets out and spends time with people, but does not have anyone up in this area to do anything with.  Patient doing well with Ensure shakes, and states her stomach does not hurt as she eats consistently.  Patient is continue physical therapy which is helping with her back arm and leg pain in addition to her general osteoarthritis.  Patient going to see her new family doctor in the morning and will discuss all this with her as well.      Vital signs and history as obtained by clinical staff.  Past Medical History:   Diagnosis Date   . Anxiety    . Depression    . Diabetes mellitus, type 2 (CMS HCC)    . GERD (gastroesophageal reflux disease)    . History of concussion    . HTN (hypertension)    . Hyperlipidemia    . Stress    . Ulcerative colitis (CMS HCC)          Past Surgical History:   Procedure Laterality Date   . HX CESAREAN SECTION     . HX HYSTERECTOMY           Family Medical History     Problem Relation (Age of Onset)    Other Mother, Father            Social History     Social History   . Marital status: Widowed     Spouse name: N/A   . Number of children:  N/A   . Years of education: N/A     Social History Main Topics   . Smoking status: Never Smoker   . Smokeless tobacco: Never Used   . Alcohol use Not on file   . Drug use: Not on file   . Sexual activity: Not on file     Other Topics Concern   . Not on file     Social History Narrative     Expanded Substance History     Additional history       Allergies:  Allergies   Allergen Reactions   . Allergy-Decongestant [Dexbrompheniramine-Pseudoephed]    . Codeine    . Cortisone    . Motrin [Ibuprofen]      Problem List:  Patient Active Problem List    Diagnosis   . Annular tear of cervical disc   . HNP (herniated nucleus pulposus), cervical   .  Cervical spinal stenosis     Medication:  Outpatient Encounter Prescriptions as of 06/06/2017   Medication Sig Dispense Refill   . Amlodipine-Olmesartan 5-20 mg Oral Tablet      . B COMPLEX-VITAMIN B12 Oral Tablet      . clonazePAM (KLONOPIN) 1 mg Oral Tablet      . cyanocobalamin (VITAMIN B12) 1,000 mcg/mL Injection Solution      . DAILY-VITE Oral Tablet      . DULoxetine (CYMBALTA DR) 30 mg Oral Capsule, Delayed Release(E.C.) Take 1 Cap (30 mg total) by mouth Once a day for 30 days 30 Cap 0   . famotidine (PEPCID) 20 mg Oral Tablet      . Food Supplement, Lactose-Free (ENSURE ACTIVE HIGH PROTEIN) Oral Liquid Use daily to bid prn 1 Package 5   . levothyroxine (SYNTHROID) 50 mcg Oral Tablet Take 1 Tab (50 mcg total) by mouth Every morning for 30 days 30 Tab 5   . metformin (GLUCOPHAGE-XR) 750 mg Oral Tablet Sustained Release 24 hr Take 1 Tab (750 mg total) by mouth Twice daily 60 Tab 4   . PROAIR HFA 90 mcg/actuation Inhalation HFA Aerosol Inhaler      . VITAMIN D 50,000 unit Oral Capsule TAKE ONE CAPSULE BY MOUTH ONCE WEEKLY 12 Cap 0     No facility-administered encounter medications on file as of 06/06/2017.        Review of Systems:  Rest of review of systems negative at this time.  All pertinent positives noted HPI.    Exam:  Vital: BP 100/60  Pulse 93  Temp 36.8 C (98.3 F)  (Tympanic)   Ht 1.575 m (5\' 2" )  Wt 73.5 kg (162 lb)  SpO2 98%  BMI 29.63 kg/m2  Unchanged physical exam today  General: alert, cooperative, no distress, appears stated age  Neck- supple, symmetrical, trachea midline, no adenopathy  Lungs: clear to auscultation bilaterally. No crackles, no wheezes   Cardiovascular: Heart regular rate and rhythm, no significant murmurs, no carotid bruits  Skin: Skin color, texture, turgor normal. No rash.  Lymph nodes: Cervical nodes normal.  Neurologic: alert and oriented x3.     Assessment and Plan:    ICD-10-CM    1. Gastroesophageal reflux disease, esophagitis presence not specified K21.9    2. Diabetes (CMS HCC) E11.9    3. Fatigue, unspecified type R53.83    4. Difficulty controlling anger R45.4    5. Malaise R53.81    6. Allergic rhinitis J30.9    7. Clinical decompensation R68.89    8. Depression, unspecified depression type F32.9    9. Hypothyroidism, unspecified type E03.9    10. Back pain, unspecified back location, unspecified back pain laterality, unspecified chronicity M54.9    11. PND (post-nasal drip) R09.82    12. Dry mouth R68.2    13. Severe muscle deconditioning R29.898      GERD:  Continue Pepcid and Protonix can increase Protonix to b.i.d. occasionally for flare ups.  Patient does better when she does eat.  Patient would like to see a nutritionist, advised to bring up with family doctor.  Patient was also see a gastroenterologist from her last family doctor was never able to get an appointment.  I have also advised to bring this up with her family doctor.  Metformin was just decreased to 750 b.i.d. to see if that helps improve her stomach.  Diabetes well under control.  Patient does much better she eats.  Cataracts:  Patient would like referral  to get her cataracts evaluated advised to consider doctor bandage but see her family doctor like to use.  Muscle decompensation and back pain:  Stable at this time none changed, advised to continue physical therapy at  this time.  Decrease in ADLs:  Discussed at length with patient, advised follow-up with family doctor consider changing living status, consider moving to the [place] or some kind of assisted living.  I did discuss with the length in the past and patient would like this.  It does time and especially with the next issue listed below.  Anxiety depression:  Patient still admitting to crying almost every Sunday and every day she does have anything to do.  Patient states she cannot get out and walk at this time due to pain. She would do better with company at this time and should consider moving to facility were she be around more people.  I did discuss with there in the past this at length and she does agree that this would probably help.  Patient also might move in with her son as well.  Consider changes in medication.  Advised brings up the family doctor.  Fatigue:  Multifactorial,  labs unremarkable.  Muscle decompensation:  continue physical therapy.  Encouraged to be more active.      Juel Burrow, [place]    Portions of this note may be dictated using voice recognition software or a dictation service. Variances in spelling and vocabulary are possible and unintentional. Not all errors are caught/corrected. Please notify the [facility]in if any discrepancies are noted or if the meaning of any statement is not clear.   Patient seen independently

## 2017-06-07 NOTE — Nursing Note (Signed)
establish care

## 2017-06-07 NOTE — H&P (Signed)
[facility]  [address] [place] [address]  Operated by [facility]  History and Physical     Name: Elizabeth Page MRN:  [id number]   Date: 06/07/2017 Age: 71 y.o.       Chief Complaint: Establish Care    History of Present Illness   The history is provided by the patient.   here for new patient establishment.  needs referred to dr. Blenda Nicely for cataracts.  needs labs.  feeling anxious.  doesn't feel like meds are working anymore.    Patient Active Problem List    Diagnosis    Annular tear of cervical disc    HNP (herniated nucleus pulposus), cervical    Cervical spinal stenosis     Past Medical History:   Diagnosis Date    Anxiety     Depression     Diabetes mellitus, type 2 (CMS HCC)     GERD (gastroesophageal reflux disease)     History of concussion     HTN (hypertension)     Hyperlipidemia     Stress     Ulcerative colitis (CMS HCC)          Past Surgical History:   Procedure Laterality Date    HX CESAREAN SECTION      HX HYSTERECTOMY      ORIF ANKLE FRACTURE           Current Outpatient Prescriptions   Medication Sig    ACCU-CHEK AVIVA CONTROL SOLN Solution     ACCU-CHEK SOFTCLIX LANCETS Misc     Amlodipine-Olmesartan 5-20 mg Oral Tablet     B COMPLEX-VITAMIN B12 Oral Tablet     clonazePAM (KLONOPIN) 1 mg Oral Tablet     cyanocobalamin (VITAMIN B12) 1,000 mcg/mL Injection Solution     DAILY-VITE Oral Tablet     dorzolamide (TRUSOPT) 2 % Ophthalmic Drops Instill 1 Drop into both eyes Three times a day    DULoxetine (CYMBALTA DR) 30 mg Oral Capsule, Delayed Release(E.C.)     DULoxetine (CYMBALTA DR) 60 mg Oral Capsule, Delayed Release(E.C.) Take 1 Cap (60 mg total) by mouth Once a day    famotidine (PEPCID) 20 mg Oral Tablet     Food Supplement, Lactose-Free (ENSURE ACTIVE HIGH PROTEIN) Oral Liquid Use daily to bid prn    glimepiride (AMARYL) 1 mg Oral Tablet Take 2 mg by mouth Every morning with breakfast    levothyroxine (SYNTHROID) 50 mcg Oral Tablet Take 1  Tab (50 mcg total) by mouth Every morning for 30 days    LORazepam (ATIVAN) 1 mg Oral Tablet Take 1 Tab (1 mg total) by mouth Twice per day as needed for Anxiety    metformin (GLUCOPHAGE-XR) 750 mg Oral Tablet Sustained Release 24 hr Take 1 Tab (750 mg total) by mouth Twice daily    metoprolol succinate (TOPROL-XL) 25 mg Oral Tablet Sustained Release 24 hr Take 25 mg by mouth Once a day    pantoprazole (PROTONIX) 40 mg Oral Tablet, Delayed Release (E.C.)     PROAIR HFA 90 mcg/actuation Inhalation HFA Aerosol Inhaler     simvastatin (ZOCOR) 20 mg Oral Tablet Take 20 mg by mouth Every evening    timolol maleate (TIMOPTIC) 0.5 % Ophthalmic Drops 1 Drop Once a day    VITAMIN D 50,000 unit Oral Capsule TAKE ONE CAPSULE BY MOUTH ONCE WEEKLY    [DISCONTINUED] DULoxetine (CYMBALTA DR) 30 mg Oral Capsule, Delayed Release(E.C.) Take 1 Cap (30 mg total) by mouth Once  a day for 30 days     Allergies   Allergen Reactions    Allergy-Decongestant [Dexbrompheniramine-Pseudoephed]     Codeine     Cortisone     Motrin [Ibuprofen]      Family Medical History     Problem Relation (Age of Onset)    Other Mother, Father            Social History   Substance Use Topics    Smoking status: Never Smoker    Smokeless tobacco: Never Used    Alcohol use No      Review of Systems  Review of Systems   Constitutional: Negative.    HENT: Negative.    Eyes: Negative.    Respiratory: Negative.    Cardiovascular: Negative.    Gastrointestinal: Negative.    Endocrine: Negative.    Genitourinary: Negative.    Musculoskeletal: Negative.    Skin: Negative.    Allergic/Immunologic: Negative.    Neurological: Negative.    Hematological: Negative.    Psychiatric/Behavioral: The patient is nervous/anxious.      Examination:  BP 102/64   Pulse (!) 107   Temp 36.2 C (97.1 F)   Resp 16   Ht 1.575 m (5\' 2" )   Wt 73.5 kg (162 lb)   BMI 29.63 kg/m2  Physical Exam   Constitutional: She is oriented to person, place, and time. She appears  well-developed and well-nourished.   HENT:   Head: Normocephalic and atraumatic.   Eyes: Conjunctivae and EOM are normal. Pupils are equal, round, and reactive to light.   Neck: Normal range of motion. Neck supple.   Cardiovascular: Normal rate, regular rhythm, normal heart sounds and intact distal pulses.  Exam reveals no gallop and no friction rub.    No murmur heard.  Pulmonary/Chest: Effort normal and breath sounds normal.   Abdominal: Soft. Bowel sounds are normal.   Musculoskeletal: Normal range of motion.   Neurological: She is alert and oriented to person, place, and time. She has normal reflexes.   Skin: Skin is warm and dry.   Psychiatric: She has a normal mood and affect. Her behavior is normal. Judgment and thought content normal.   Nursing note and vitals reviewed.        Assessment and Plan  Problem List Items Addressed This Visit     None      Visit Diagnoses     Diabetes (CMS HCC)    -  Primary    Cataract        Relevant Orders    Refer to Provider-External    Mixed hyperlipidemia        Gastroesophageal reflux disease without esophagitis        Generalized anxiety disorder        Acquired hypothyroidism               [name], PA-C     Patient seen independently

## 2017-06-10 ENCOUNTER — Telehealth (HOSPITAL_BASED_OUTPATIENT_CLINIC_OR_DEPARTMENT_OTHER): Payer: Self-pay | Admitting: PHYSICIAN ASSISTANT

## 2017-06-13 ENCOUNTER — Encounter (INDEPENDENT_AMBULATORY_CARE_PROVIDER_SITE_OTHER): Payer: Self-pay | Admitting: PHYSICIAN ASSISTANT

## 2017-06-13 ENCOUNTER — Ambulatory Visit (INDEPENDENT_AMBULATORY_CARE_PROVIDER_SITE_OTHER): Payer: Commercial Managed Care - PPO | Admitting: PHYSICIAN ASSISTANT

## 2017-06-13 VITALS — BP 102/60 | HR 95 | Temp 97.4°F | Ht 62.0 in | Wt 164.0 lb

## 2017-06-13 DIAGNOSIS — F13230 Sedative, hypnotic or anxiolytic dependence with withdrawal, uncomplicated: Secondary | ICD-10-CM

## 2017-06-13 DIAGNOSIS — R110 Nausea: Secondary | ICD-10-CM

## 2017-06-13 DIAGNOSIS — F13930 Sedative, hypnotic or anxiolytic use, unspecified with withdrawal, uncomplicated: Secondary | ICD-10-CM

## 2017-06-13 DIAGNOSIS — F419 Anxiety disorder, unspecified: Secondary | ICD-10-CM

## 2017-06-13 DIAGNOSIS — Z6830 Body mass index (BMI) 30.0-30.9, adult: Secondary | ICD-10-CM

## 2017-06-13 DIAGNOSIS — R630 Anorexia: Secondary | ICD-10-CM

## 2017-06-14 NOTE — Progress Notes (Signed)
[facility]  [address] [place] [address]       Name: Elizabeth Page MRN:  [id number]   Date: 06/13/2017 Age: 71 y.o.     Chief Complaint     Blood Sugar Check Blood sugar is 140          HPI: Elizabeth Page is a 71 y.o. female who presents today with Blood Sugar Check (Blood sugar is 140) as well as anxiety nausea and body aches today.  When asked patient is no longer taking her Klonopin but was switched to Ativan per her new PCP.  Patient has not taken Klonopin for several days and has not started the Ativan yet.  Patient is reporting nausea, body aches, worsening anxiety at this time.  No loss consciousness or altered mental status.  Patient states she is moving back with her son to [place] and is scared that she will withdrawal from not having her nerve medication and is unsure if she will be able to find a family doctor down there as she has just started getting the care that she needs as she has finally gotten a new PCP.    History:  Vital signs and history as obtained by clinical staff.  Past Medical History:   Diagnosis Date   . Anxiety    . Depression    . Diabetes mellitus, type 2 (CMS HCC)    . GERD (gastroesophageal reflux disease)    . History of concussion    . HTN (hypertension)    . Hyperlipidemia    . Stress    . Ulcerative colitis (CMS HCC)          Past Surgical History:   Procedure Laterality Date   . HX CESAREAN SECTION     . HX HYSTERECTOMY     . ORIF ANKLE FRACTURE           Family Medical History     Problem Relation (Age of Onset)    Other Mother, Father            Social History     Social History   . Marital status: Widowed     Spouse name: N/A   . Number of children: N/A   . Years of education: N/A     Social History Main Topics   . Smoking status: Never Smoker   . Smokeless tobacco: Never Used   . Alcohol use No   . Drug use: Not on file   . Sexual activity: Not on file     Other Topics Concern   . Not on file     Social History Narrative     Expanded Substance  History     Additional history       Allergies:  Allergies   Allergen Reactions   . Allergy-Decongestant [Dexbrompheniramine-Pseudoephed]    . Codeine    . Cortisone    . Motrin [Ibuprofen]      Problem List:  Patient Active Problem List    Diagnosis   . Annular tear of cervical disc   . HNP (herniated nucleus pulposus), cervical   . Cervical spinal stenosis     Medication:  Outpatient Encounter Prescriptions as of 06/13/2017   Medication Sig Dispense Refill   . ACCU-CHEK AVIVA CONTROL SOLN Solution      . ACCU-CHEK SOFTCLIX LANCETS Misc      . Amlodipine-Olmesartan 5-20 mg Oral Tablet      . B COMPLEX-VITAMIN B12 Oral Tablet      .  cyanocobalamin (VITAMIN B12) 1,000 mcg/mL Injection Solution      . DAILY-VITE Oral Tablet      . dorzolamide (TRUSOPT) 2 % Ophthalmic Drops Instill 1 Drop into both eyes Three times a day     . DULoxetine (CYMBALTA DR) 60 mg Oral Capsule, Delayed Release(E.C.) Take 1 Cap (60 mg total) by mouth Once a day 30 Cap 5   . famotidine (PEPCID) 20 mg Oral Tablet      . Food Supplement, Lactose-Free (ENSURE ACTIVE HIGH PROTEIN) Oral Liquid Use daily to bid prn 1 Package 5   . glimepiride (AMARYL) 1 mg Oral Tablet Take 2 mg by mouth Every morning with breakfast     . levothyroxine (SYNTHROID) 50 mcg Oral Tablet Take 1 Tab (50 mcg total) by mouth Every morning for 30 days 30 Tab 5   . LORazepam (ATIVAN) 1 mg Oral Tablet Take 1 Tab (1 mg total) by mouth Twice per day as needed for Anxiety 60 Tab 0   . metformin (GLUCOPHAGE-XR) 750 mg Oral Tablet Sustained Release 24 hr Take 1 Tab (750 mg total) by mouth Twice daily 60 Tab 4   . metoprolol succinate (TOPROL-XL) 25 mg Oral Tablet Sustained Release 24 hr Take 25 mg by mouth Once a day     . pantoprazole (PROTONIX) 40 mg Oral Tablet, Delayed Release (E.C.)      . PROAIR HFA 90 mcg/actuation Inhalation HFA Aerosol Inhaler      . simvastatin (ZOCOR) 20 mg Oral Tablet Take 20 mg by mouth Every evening     . timolol maleate (TIMOPTIC) 0.5 % Ophthalmic Drops  1 Drop Once a day     . VITAMIN D 50,000 unit Oral Capsule TAKE ONE CAPSULE BY MOUTH ONCE WEEKLY 12 Cap 0     No facility-administered encounter medications on file as of 06/13/2017.        Review of Systems:  All pertinent positives in regards review systems noted HPI.  Rest review of systems unremarkable.    Exam:  Vital: BP 102/60  Pulse 95  Temp 36.3 C (97.4 F) (Tympanic)   Ht 1.575 m (5\' 2" )  Wt 74.4 kg (164 lb)  SpO2 98%  BMI 30 kg/m2  General: alert, cooperative, mild distress, appears stated age.  Patient appears anxious and teary today no suicidal or homicidal ideations.  Eyes: Conjunctivae/corneas clear, PERRLA.  Ears: Left TM and canal normal.  Right TM and canal normal. External ear canals clear  Nose: Nares normal. Mucosa normal.  Throat: Pharynx without exudate. No oral lesions.    Lips, mucosa, and tongue normal.  Head - Normocephalic, without obvious abnormality, atraumatic  Neck- supple, symmetrical, trachea midline, no adenopathy  Lungs: clear to auscultation bilaterally. No crackles, no wheezes   Cardiovascular: Heart regular rate and rhythm, no significant murmurs, no carotid bruits  Abdomen: soft, non-tender, non-distended, no hepatosplenomegaly, no masses and no hernias  Extremities: extremities normal, atraumatic, no cyanosis or edema  Skin: Skin color, texture, turgor normal. No rash.  Neurologic: alert and oriented x3.     Assessment and Plan:    ICD-10-CM    1. 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 Anxiety F41.9    3. Decreased appetite R63.0    4. Nausea R11.0      Likely withdrawal from not being on her Klonopin and not being tapered off of them.  Advised that she should start her medications as prescribed by her PCP.  Patient understands and  agrees.  No neurological signs at this time.  Advised to follow up in [place] and continue to find family doctor for counseling, GI consultation, nutrition consultation, and for her cataracts.   supportive care discussed  ddx discussed at length  otc measures  medication as directed  recheck as needed  f/u pcp as needed/discussed  ED if worsening        Juel Burrow, [place]    Portions of this note may be dictated using voice recognition software or a dictation service. Variances in spelling and vocabulary are possible and unintentional. Not all errors are caught/corrected. Please notify the [facility] if any discrepancies are noted or if the meaning of any statement is not clear.     Patient seen independently

## 2017-06-15 ENCOUNTER — Other Ambulatory Visit (INDEPENDENT_AMBULATORY_CARE_PROVIDER_SITE_OTHER): Payer: Self-pay | Admitting: PHYSICIAN ASSISTANT

## 2017-06-15 ENCOUNTER — Other Ambulatory Visit (HOSPITAL_BASED_OUTPATIENT_CLINIC_OR_DEPARTMENT_OTHER): Payer: Self-pay | Admitting: PHYSICIAN ASSISTANT

## 2017-07-01 ENCOUNTER — Other Ambulatory Visit (HOSPITAL_BASED_OUTPATIENT_CLINIC_OR_DEPARTMENT_OTHER): Payer: Self-pay | Admitting: PHYSICIAN ASSISTANT

## 2017-07-01 DIAGNOSIS — E119 Type 2 diabetes mellitus without complications: Secondary | ICD-10-CM

## 2017-07-01 MED ORDER — METFORMIN ER 750 MG TABLET,EXTENDED RELEASE 24 HR
750.0000 mg | ORAL_TABLET | Freq: Two times a day (BID) | ORAL | 0 refills | Status: DC
Start: 2017-07-01 — End: 2017-07-29

## 2017-07-01 NOTE — Telephone Encounter (Signed)
THIS MEDICATION NEEDS TO BE E-SCRIBED  Eulas Post, RN  07/01/2017, 10:24

## 2017-07-05 ENCOUNTER — Other Ambulatory Visit (HOSPITAL_BASED_OUTPATIENT_CLINIC_OR_DEPARTMENT_OTHER): Payer: Self-pay | Admitting: PHYSICIAN ASSISTANT

## 2017-07-05 MED ORDER — AMLODIPINE 5 MG-OLMESARTAN 20 MG TABLET
ORAL_TABLET | ORAL | 0 refills | Status: DC
Start: 2017-07-05 — End: 2017-07-05

## 2017-07-05 NOTE — Telephone Encounter (Signed)
THIS MEDICATION NEEDS TO BE E-SCRIBED  Eulas Post, RN  07/05/2017, 16:18

## 2017-07-07 ENCOUNTER — Other Ambulatory Visit (HOSPITAL_BASED_OUTPATIENT_CLINIC_OR_DEPARTMENT_OTHER): Payer: Self-pay | Admitting: PHYSICIAN ASSISTANT

## 2017-07-07 MED ORDER — LORAZEPAM 1 MG TABLET
1.0000 mg | ORAL_TABLET | Freq: Two times a day (BID) | ORAL | 2 refills | Status: DC | PRN
Start: 2017-07-07 — End: 2017-08-02

## 2017-07-07 NOTE — Telephone Encounter (Signed)
RX CALLED INTO PHARMACY  Eulas Post, RN  07/07/2017, 11:10

## 2017-07-11 MED ORDER — CALCIUM CARB-ERGOCALCIFEROL (VIT D2) 500 MG (1,250 MG)-200 UNIT TABLET
ORAL_TABLET | ORAL | 3 refills | Status: DC
Start: 2017-07-11 — End: 2017-07-13

## 2017-07-11 NOTE — Telephone Encounter (Signed)
THIS MEDICATION NEEDS TO BE E-SCRIBED  Eulas Post, RN  07/11/2017, 12:46

## 2017-07-12 ENCOUNTER — Ambulatory Visit (HOSPITAL_BASED_OUTPATIENT_CLINIC_OR_DEPARTMENT_OTHER): Payer: Self-pay | Admitting: PHYSICIAN ASSISTANT

## 2017-07-13 ENCOUNTER — Emergency Department
Admission: EM | Admit: 2017-07-13 | Discharge: 2017-07-13 | Disposition: A | Discharge status: Home / Self Care | Payer: Commercial Managed Care - PPO | Attending: Emergency Medicine | Admitting: Emergency Medicine

## 2017-07-13 ENCOUNTER — Encounter (HOSPITAL_BASED_OUTPATIENT_CLINIC_OR_DEPARTMENT_OTHER): Payer: Self-pay | Admitting: PHYSICIAN ASSISTANT

## 2017-07-13 ENCOUNTER — Emergency Department (HOSPITAL_COMMUNITY): Payer: Commercial Managed Care - PPO

## 2017-07-13 ENCOUNTER — Encounter (HOSPITAL_COMMUNITY): Payer: Self-pay

## 2017-07-13 ENCOUNTER — Other Ambulatory Visit (HOSPITAL_BASED_OUTPATIENT_CLINIC_OR_DEPARTMENT_OTHER): Payer: Self-pay | Admitting: PHYSICIAN ASSISTANT

## 2017-07-13 DIAGNOSIS — Z886 Allergy status to analgesic agent status: Secondary | ICD-10-CM | POA: Insufficient documentation

## 2017-07-13 DIAGNOSIS — K5190 Ulcerative colitis, unspecified, without complications: Secondary | ICD-10-CM | POA: Insufficient documentation

## 2017-07-13 DIAGNOSIS — E785 Hyperlipidemia, unspecified: Secondary | ICD-10-CM | POA: Insufficient documentation

## 2017-07-13 DIAGNOSIS — E860 Dehydration: Secondary | ICD-10-CM | POA: Insufficient documentation

## 2017-07-13 DIAGNOSIS — R5383 Other fatigue: Secondary | ICD-10-CM | POA: Insufficient documentation

## 2017-07-13 DIAGNOSIS — Z888 Allergy status to other drugs, medicaments and biological substances status: Secondary | ICD-10-CM | POA: Insufficient documentation

## 2017-07-13 DIAGNOSIS — E119 Type 2 diabetes mellitus without complications: Secondary | ICD-10-CM | POA: Insufficient documentation

## 2017-07-13 DIAGNOSIS — I10 Essential (primary) hypertension: Secondary | ICD-10-CM | POA: Insufficient documentation

## 2017-07-13 DIAGNOSIS — Z885 Allergy status to narcotic agent status: Secondary | ICD-10-CM | POA: Insufficient documentation

## 2017-07-13 DIAGNOSIS — K219 Gastro-esophageal reflux disease without esophagitis: Secondary | ICD-10-CM | POA: Insufficient documentation

## 2017-07-13 LAB — CBC WITH DIFF
BASOPHIL #: 0 x10?3/uL (ref 0.00–0.30)
BASOPHIL #: 0 x10ˆ3/uL (ref 0.00–0.30)
BASOPHIL %: 1 % (ref 0–1)
EOSINOPHIL #: 0.1 x10ˆ3/uL (ref 0.00–0.50)
EOSINOPHIL %: 1 % (ref 0–4)
HCT: 36.7 % — ABNORMAL LOW (ref 37.0–47.0)
HGB: 12.3 g/dL — ABNORMAL LOW (ref 12.5–16.0)
LYMPHOCYTE #: 1.7 x10ˆ3/uL (ref 0.90–2.90)
LYMPHOCYTE %: 31 % (ref 23–35)
LYMPHOCYTE %: 31 % (ref 23–35)
MCH: 27.1 pg — ABNORMAL LOW (ref 28.0–33.0)
MCHC: 33.4 g/dL (ref 32.0–37.0)
MCV: 81.2 fL (ref 78.0–100.0)
MONOCYTE #: 0.5 x10ˆ3/uL (ref 0.30–0.90)
MONOCYTE %: 9 % (ref 0–12)
MONOCYTE %: 9 % (ref 0–12)
NEUTROPHIL #: 3.1 x10ˆ3/uL (ref 1.70–7.00)
NEUTROPHIL %: 59 % (ref 50–70)
PLATELETS: 253 x10ˆ3/uL (ref 130–400)
RBC: 4.52 x10ˆ6/uL (ref 4.20–5.40)
RDW: 14.2 % (ref 9.9–16.5)
RDW: 14.2 % (ref 9.9–16.5)
WBC: 5.4 x10ˆ3/uL (ref 4.5–11.0)

## 2017-07-13 LAB — URINALYSIS, MICROSCOPIC

## 2017-07-13 LAB — COMPREHENSIVE METABOLIC PANEL, NON-FASTING
ALBUMIN: 4.1 g/dL (ref 3.5–4.8)
ALKALINE PHOSPHATASE: 62 U/L (ref 38–126)
ALT (SGPT): 17 U/L (ref 14–54)
ANION GAP: 12 mmol/L (ref 4–13)
AST (SGOT): 25 U/L (ref 15–41)
BILIRUBIN TOTAL: 1.1 mg/dL (ref 0.2–2.0)
BUN: 24 mg/dL — ABNORMAL HIGH (ref 8–20)
CALCIUM: 9.4 mg/dL (ref 8.9–10.3)
CHLORIDE: 97 mmol/L — ABNORMAL LOW (ref 101–111)
CO2 TOTAL: 21 mmol/L — ABNORMAL LOW (ref 22–32)
CREATININE: 1 mg/dL (ref 0.40–1.00)
ESTIMATED GFR: 55 mL/min/1.73mˆ2 — ABNORMAL LOW (ref >60)
GLUCOSE: 155 mg/dL — ABNORMAL HIGH (ref 70–99)
GLUCOSE: 155 mg/dL — ABNORMAL HIGH (ref 70–99)
POTASSIUM: 4.4 mmol/L (ref 3.6–5.1)
PROTEIN TOTAL: 7.2 g/dL (ref 6.1–7.9)
SODIUM: 130 mmol/L — ABNORMAL LOW (ref 135–145)

## 2017-07-13 LAB — FOLATE: FOLATE: >24 ng/mL (ref >=5.9)

## 2017-07-13 LAB — URINALYSIS, MACROSCOPIC
BLOOD: NEGATIVE mg/dL
GLUCOSE: NEGATIVE mg/dL
LEUKOCYTES: NEGATIVE WBCs/uL
NITRITE: NEGATIVE
PH: 6 (ref 4.5–8.0)
SPECIFIC GRAVITY: <=1.005 (ref 1.001–1.035)

## 2017-07-13 LAB — THYROID STIMULATING HORMONE WITH FREE T4 REFLEX: TSH: 3.17 u[IU]/mL (ref 0.340–5.330)

## 2017-07-13 MED ORDER — CYANOCOBALAMIN (VIT B-12) 1,000 MCG/ML INJECTION SOLUTION
1000.00 ug | INTRAMUSCULAR | Status: AC
Start: 2017-07-13 — End: 2017-07-13
  Administered 2017-07-13: 1000 ug via INTRAMUSCULAR

## 2017-07-13 MED ORDER — CALCIUM CARB-ERGOCALCIFEROL (VIT D2) 500 MG (1,250 MG)-200 UNIT TABLET
ORAL_TABLET | ORAL | 3 refills | Status: DC
Start: 2017-07-13 — End: 2018-06-09

## 2017-07-13 MED ORDER — CYANOCOBALAMIN (VIT B-12) 1,000 MCG/ML INJECTION SOLUTION
INTRAMUSCULAR | Status: AC
Start: 2017-07-13 — End: 2017-07-13
  Filled 2017-07-13: qty 1

## 2017-07-13 MED ORDER — SODIUM CHLORIDE 0.9 % INTRAVENOUS SOLUTION
INTRAVENOUS | Status: AC
Start: 2017-07-13 — End: 2017-07-13
  Filled 2017-07-13: qty 1000

## 2017-07-13 MED ORDER — SODIUM CHLORIDE 0.9 % IV BOLUS
1000.00 mL | INJECTION | Status: AC
Start: 2017-07-13 — End: 2017-07-13
  Administered 2017-07-13: 0 mL via INTRAVENOUS
  Administered 2017-07-13 (×2): 1000 mL via INTRAVENOUS

## 2017-07-13 MED ADMIN — sodium chloride 0.9 % (flush) injection syringe: INTRAMUSCULAR | @ 14:00:00

## 2017-07-13 MED ADMIN — sodium chloride 0.9 % intravenous solution: INTRAVENOUS | @ 17:00:00 | NDC 00338004904

## 2017-07-13 NOTE — ED Triage Notes (Signed)
Pt reports generalized weakness x 5 days. Pt reports low energy. Pt reports she ran out of her Synthroid 2 weeks ago however she restarted medication this morning.

## 2017-07-13 NOTE — Discharge Instructions (Signed)
Maintain proper hydration, nutrition and sleep.  Take all medications exactly as prescribed.  Follow up with PCP in 5-7 days if symptoms persist.  Return to ED if new symptoms develop.

## 2017-07-13 NOTE — ED Provider Notes (Addendum)
Procedures    Elizabeth Page   September 14, 1946   71 y.o.   female     Chief Complaint:   Chief Complaint   Patient presents with   . Fatigue        HPI: This is a 71 y.o. female who presents to the emergency department with generalized fatigue.  She says that she missed her last vitamin B12 injection, has been off of her synthroid for 2-3 weeks (just restarted it today), and has had decreased PO intake because she has had "too little energy to get up and fix her meals."  No chest pain, palpitations, nausea, vomiting, diarrhea, constipation, melena, hematochezia, dysuria or hematuria.  ?   ?   ?   Past Medical History:   Past Medical History:   Diagnosis Date   . Anxiety    . Depression    . Diabetes mellitus, type 2 (CMS HCC)    . GERD (gastroesophageal reflux disease)    . History of concussion    . HTN (hypertension)    . Hyperlipidemia    . Stress    . Ulcerative colitis (CMS HCC)         Past Surgical History:   Past Surgical History:   Procedure Laterality Date   . Hx cesarean section     . Hx hysterectomy     . Orif ankle fracture          Social History:   Social History   Substance Use Topics   . Smoking status: Never Smoker   . Smokeless tobacco: Never Used   . Alcohol use No      History   Drug Use Not on file         Allergies:   Allergies   Allergen Reactions   . Allergy-Decongestant [Dexbrompheniramine-Pseudoephed]    . Codeine    . Cortisone    . Motrin [Ibuprofen]       ?     Review of Systems: 8 systems reviewed and negative except as noted in HPI.   ?     Physical Exam:     General: Patient alert and oriented x4. No acute distress. BP 115/64  Pulse 80  Temp 37.2 C (98.9 F)  Resp 20  Ht 1.575 m ( )  Wt 72.6 kg (160 lb)  SpO2 96%  BMI 29.26 kg/m2     HEENT: Head: Normocephalic, atraumatic. Eyes: Normal conjunctiva. Oral mucosa moist.    Respiratory: No respiratory distress noted.     MS: Appropriate ROM. Normal Strength.     Neuro: Alert and oriented x4. No focal motor or sensory  deficits.     Skin: Warm and dry. No rashes or lesions appreciated.   ?   ?   ?   Medical Decision Making: Patient was triaged, vital signs were obtained, patient was placed in a room. I did examine the patient. After examining the patient and reviewing all of her lab values, I decided to discharge her with close PCP follow up.  Pt says she felt much better after receiving a Vitamin B12 injection and wants to go home.  Continue taking synthroid as instructed and maintain proper hydration, nutrition and sleep.  Follow up with PCP in 1 week or as needed.  Return to ED if new symptoms develop.  ?   ?   ?   Clinical Impression:   Encounter Diagnoses   Name Primary?   . Fatigue, unspecified type Yes   .  Mild dehydration       ?   ?   Disposition: Discharged

## 2017-07-13 NOTE — Telephone Encounter (Signed)
RX CALLED INTO PHARMACY  Eulas Post, RN  07/13/2017, 11:06

## 2017-07-13 NOTE — ED Nurses Note (Signed)
Pt walking around ED.

## 2017-07-14 ENCOUNTER — Ambulatory Visit (INDEPENDENT_AMBULATORY_CARE_PROVIDER_SITE_OTHER): Payer: Commercial Managed Care - PPO | Admitting: PHYSICIAN ASSISTANT

## 2017-07-14 ENCOUNTER — Encounter (INDEPENDENT_AMBULATORY_CARE_PROVIDER_SITE_OTHER): Payer: Self-pay | Admitting: PHYSICIAN ASSISTANT

## 2017-07-14 VITALS — BP 100/68 | HR 117 | Temp 97.8°F | Wt 157.6 lb

## 2017-07-14 DIAGNOSIS — F419 Anxiety disorder, unspecified: Principal | ICD-10-CM

## 2017-07-14 DIAGNOSIS — R5381 Other malaise: Secondary | ICD-10-CM

## 2017-07-14 DIAGNOSIS — R110 Nausea: Secondary | ICD-10-CM

## 2017-07-14 DIAGNOSIS — F32A Depression, unspecified: Secondary | ICD-10-CM

## 2017-07-14 DIAGNOSIS — Z6828 Body mass index (BMI) 28.0-28.9, adult: Secondary | ICD-10-CM

## 2017-07-14 DIAGNOSIS — R6889 Other general symptoms and signs: Secondary | ICD-10-CM

## 2017-07-14 DIAGNOSIS — F329 Major depressive disorder, single episode, unspecified: Secondary | ICD-10-CM

## 2017-07-14 DIAGNOSIS — R630 Anorexia: Secondary | ICD-10-CM

## 2017-07-15 NOTE — Progress Notes (Signed)
[facility]  [address] [place] [address]       Name: Elizabeth Page MRN:  [id number]   Date: 07/14/2017 Age: 71 y.o.     Chief Complaint     Fatigue               HPI: Elizabeth Page is a 71 y.o. female who presents today with severe anxiety depression fatigue and inability to eat.  Patient states she has given up and just wants to go home and die.  Patient feels like if she is not admitted to the hospital she would just go home and that will be the end of her life per patient.  Patient recently had a fight with her son and which she moved out from his home in [place].  She feels like this is not contributing to how she feels now and that she has just been getting worse.  Patient just had her Cymbalta increase, but does not feel like it is the cause of this.  Patient has been taking her medicines regularly at this time.    History:  Vital signs and history as obtained by clinical staff.  Past Medical History:   Diagnosis Date   . Anxiety    . Depression    . Diabetes mellitus, type 2 (CMS HCC)    . GERD (gastroesophageal reflux disease)    . History of concussion    . HTN (hypertension)    . Hyperlipidemia    . Stress    . Ulcerative colitis (CMS HCC)          Past Surgical History:   Procedure Laterality Date   . HX CESAREAN SECTION     . HX HYSTERECTOMY     . ORIF ANKLE FRACTURE           Family Medical History:     Problem Relation (Age of Onset)    Other Mother, Father            Social History     Social History   . Marital status: Widowed     Spouse name: N/A   . Number of children: N/A   . Years of education: N/A     Social History Main Topics   . Smoking status: Never Smoker   . Smokeless tobacco: Never Used   . Alcohol use No   . Drug use: Not on file   . Sexual activity: Not on file     Other Topics Concern   . Not on file     Social History Narrative     Expanded Substance History     Additional history       Allergies:  Allergies   Allergen Reactions   . Allergy-Decongestant  [Dexbrompheniramine-Pseudoephed]    . Codeine    . Cortisone    . Motrin [Ibuprofen]      Problem List:  Patient Active Problem List    Diagnosis   . Annular tear of cervical disc   . HNP (herniated nucleus pulposus), cervical   . Cervical spinal stenosis     Medication:  Outpatient Encounter Prescriptions as of 07/14/2017   Medication Sig Dispense Refill   . ACCU-CHEK AVIVA CONTROL SOLN Solution      . ACCU-CHEK SOFTCLIX LANCETS Misc      . amLODIPine (NORVASC) 5 mg Oral Tablet Take 5 mg by mouth Once a day     . Amlodipine-Olmesartan 5-20 mg Oral Tablet TAKE 1 TABLET  BY MOUTH EVERY DAY 90 Tab 3   . B COMPLEX-VITAMIN B12 Oral Tablet      . Calcium Carbonate-Vitamin D2 (OYSTER SHELL CALCIUM-VIT D2) 500 mg(1,250mg ) -200 unit Oral Tablet one a week 12 Tab 3   . cyanocobalamin (VITAMIN B12) 1,000 mcg/mL Injection Solution      . DAILY-VITE Oral Tablet      . DULoxetine (CYMBALTA DR) 60 mg Oral Capsule, Delayed Release(E.C.) Take 1 Cap (60 mg total) by mouth Once a day 30 Cap 5   . famotidine (PEPCID) 20 mg Oral Tablet      . Food Supplement, Lactose-Free (ENSURE ACTIVE HIGH PROTEIN) Oral Liquid Use daily to bid prn 1 Package 5   . levothyroxine (SYNTHROID) 50 mcg Oral Tablet Take 1 Tab (50 mcg total) by mouth Every morning for 30 days 30 Tab 5   . LORazepam (ATIVAN) 1 mg Oral Tablet Take 1 Tab (1 mg total) by mouth Twice per day as needed for Anxiety 60 Tab 2   . metformin (GLUCOPHAGE-XR) 750 mg Oral Tablet Sustained Release 24 hr Take 1 Tab (750 mg total) by mouth Twice daily 60 Tab 0   . metoprolol succinate (TOPROL-XL) 25 mg Oral Tablet Sustained Release 24 hr Take 25 mg by mouth Once a day     . pantoprazole (PROTONIX) 40 mg Oral Tablet, Delayed Release (E.C.)      . VITAMIN D 50,000 unit Oral Capsule TAKE ONE CAPSULE BY MOUTH ONCE WEEKLY 12 Cap 0   . [EXPIRED] NS bolus infusion 1,000 mL        No facility-administered encounter medications on file as of 07/14/2017.        Review of Systems:  All pertinent positives  in regards review systems noted HPI.  Rest review of systems unremarkable.  Exam:  Vital: BP 100/68  Pulse (!) 117  Temp 36.6 C (97.8 F) (Tympanic)   Wt 71.5 kg (157 lb 9.6 oz)  SpO2 98%  BMI 28.83 kg/m2  General: alert, cooperative, severe anxiety and distress, appears stated age  Lungs: clear to auscultation bilaterally. No crackles, no wheezes   Cardiovascular: Heart regular rate and rhythm, no significant murmurs, no carotid bruits  Abdomen: soft, non-tender, non-distended, no hepatosplenomegaly, no masses and no hernias  Extremities: extremities normal, atraumatic, no cyanosis or edema  Skin: Skin color, texture, turgor normal. No rash.  Neurologic: alert and oriented x3.   However extremely anxious and depressed.    Assessment and Plan:    ICD-10-CM    1. Anxiety F41.9    2. Decreased appetite R63.0    3. Nausea R11.0    4. Malaise R53.81    5. Clinical decompensation R68.89    6. Depression, unspecified depression type F32.9      Severe anxiety and depression with hints of suicidal ideation.  She keeps repeating that she is just going to go home and die and does not plan on eating or drinking anything at this time.  Patient was just in the emergency room yesterday for mild dehydration.  Was given fluid and felt better in that regard, but states her anxiety depression is getting worse.  I did discuss with her family doctor who will see her for the follow-up, but she does meet clinical criteria for direct admission.  Patient is extremely upset at this and is wailing and crying and screaming in office today.  She still is repeating she wants to go home and die.  Patient is known to me through  various office visits over the years and I do believe that she is adamant in her statements in regards to this.  I discussed with her at length and will sent to emergency room at [facility] to evaluate for clinical decompensation suicidal homicidal ideations and possible admission for psych  consultation.  Ambulance was called, the EMTs arrived and made some comments towards the patient that made her even more upset.     I had discussed with her when she was a little bit more calm and less anxious about the possibility of assisted living as I do believe this would benefit her health in the long term.  Patient has had sharply declining ADLs, but should be able to live very comfortably with some assistance.  Given that she has no family in this area and no one to accompany her to appointments and to keep her company throughout the day which is what I believe that she will truly need in order to help combat not on and anxiety depression but her decrease in appetite and clinical decompensation.  Patient understands and agrees with this.    Juel Burrow, [place]    Portions of this note may be dictated using voice recognition software or a dictation service. Variances in spelling and vocabulary are possible and unintentional. Not all errors are caught/corrected. Please notify the Thereasa Parkin if any discrepancies are noted or if the meaning of any statement is not clear.   Patient seen independently

## 2017-07-19 ENCOUNTER — Other Ambulatory Visit (HOSPITAL_BASED_OUTPATIENT_CLINIC_OR_DEPARTMENT_OTHER): Payer: Self-pay | Admitting: PHYSICIAN ASSISTANT

## 2017-07-19 ENCOUNTER — Ambulatory Visit (HOSPITAL_BASED_OUTPATIENT_CLINIC_OR_DEPARTMENT_OTHER): Payer: Self-pay | Admitting: PHYSICIAN ASSISTANT

## 2017-07-19 MED ORDER — PANTOPRAZOLE 40 MG TABLET,DELAYED RELEASE
40.0000 mg | DELAYED_RELEASE_TABLET | Freq: Every day | ORAL | 3 refills | Status: DC
Start: 2017-07-19 — End: 2019-03-27

## 2017-07-19 NOTE — Telephone Encounter (Signed)
THIS MEDICATION NEEDS TO BE E-SCRIBED  Eulas Post, RN  07/19/2017, 08:08

## 2017-07-22 ENCOUNTER — Encounter (HOSPITAL_BASED_OUTPATIENT_CLINIC_OR_DEPARTMENT_OTHER): Payer: Self-pay | Admitting: PHYSICIAN ASSISTANT

## 2017-07-22 ENCOUNTER — Ambulatory Visit: Payer: Commercial Managed Care - PPO | Attending: PHYSICIAN ASSISTANT | Admitting: PHYSICIAN ASSISTANT

## 2017-07-22 VITALS — BP 124/62 | HR 101 | Temp 97.8°F | Ht 62.0 in | Wt 157.0 lb

## 2017-07-22 DIAGNOSIS — F39 Unspecified mood [affective] disorder: Secondary | ICD-10-CM | POA: Insufficient documentation

## 2017-07-22 DIAGNOSIS — K219 Gastro-esophageal reflux disease without esophagitis: Secondary | ICD-10-CM

## 2017-07-22 DIAGNOSIS — I10 Essential (primary) hypertension: Secondary | ICD-10-CM | POA: Insufficient documentation

## 2017-07-22 DIAGNOSIS — F419 Anxiety disorder, unspecified: Secondary | ICD-10-CM | POA: Insufficient documentation

## 2017-07-22 DIAGNOSIS — Z7984 Long term (current) use of oral hypoglycemic drugs: Secondary | ICD-10-CM | POA: Insufficient documentation

## 2017-07-22 DIAGNOSIS — Z6828 Body mass index (BMI) 28.0-28.9, adult: Secondary | ICD-10-CM

## 2017-07-22 DIAGNOSIS — E039 Hypothyroidism, unspecified: Secondary | ICD-10-CM | POA: Insufficient documentation

## 2017-07-22 DIAGNOSIS — E119 Type 2 diabetes mellitus without complications: Secondary | ICD-10-CM | POA: Insufficient documentation

## 2017-07-22 MED ORDER — OLANZAPINE 2.5 MG TABLET: 3 mg | Tab | Freq: Every evening | ORAL | 3 refills | 0 days | Status: DC

## 2017-07-22 NOTE — Nursing Note (Signed)
07/22/17 1323   Fall Risk Assessment   Do you feel unsteady when standing or walking? No   Do you worry about falling? No   Have you fallen in the past year? No

## 2017-07-22 NOTE — Progress Notes (Signed)
[facility]  [address] [place] [address]  Phone: [phone]    Encounter Date: 07/22/2017    Patient ID:  Elizabeth Page  WGN:[id number]    DOB: Feb 19, 1946  Age: 71 y.o. female    Subjective:     Chief Complaint   Patient presents with   . Follow Up     follow from last visit.     The history is provided by the patient.   c/o anxiety and depression.  tried to move in with her son which  did not work.  she feels lonely at times. has been in [place] in the past.   Current Outpatient Prescriptions   Medication Sig   . ACCU-CHEK AVIVA CONTROL SOLN Solution    . ACCU-CHEK SOFTCLIX LANCETS Misc    . amLODIPine (NORVASC) 5 mg Oral Tablet Take 5 mg by mouth Once a day   . Amlodipine-Olmesartan 5-20 mg Oral Tablet TAKE 1 TABLET BY MOUTH EVERY DAY   . B COMPLEX-VITAMIN B12 Oral Tablet    . Calcium Carbonate-Vitamin D2 (OYSTER SHELL CALCIUM-VIT D2) 500 mg(1,250mg ) -200 unit Oral Tablet one a week   . cyanocobalamin (VITAMIN B12) 1,000 mcg/mL Injection Solution    . DAILY-VITE Oral Tablet    . DULoxetine (CYMBALTA DR) 60 mg Oral Capsule, Delayed Release(E.C.) Take 1 Cap (60 mg total) by mouth Once a day   . famotidine (PEPCID) 20 mg Oral Tablet    . Food Supplement, Lactose-Free (ENSURE ACTIVE HIGH PROTEIN) Oral Liquid Use daily to bid prn   . levothyroxine (SYNTHROID) 50 mcg Oral Tablet Take 1 Tab (50 mcg total) by mouth Every morning for 30 days   . LORazepam (ATIVAN) 1 mg Oral Tablet Take 1 Tab (1 mg total) by mouth Twice per day as needed for Anxiety   . metformin (GLUCOPHAGE-XR) 750 mg Oral Tablet Sustained Release 24 hr Take 1 Tab (750 mg total) by mouth Twice daily   . metoprolol succinate (TOPROL-XL) 25 mg Oral Tablet Sustained Release 24 hr Take 25 mg by mouth Once a day   . OLANZapine (ZYPREXA) 2.5 mg Oral Tablet Take 1 Tab (2.5 mg total) by mouth Every night   . pantoprazole (PROTONIX) 40 mg Oral Tablet, Delayed Release (E.C.) Take 1 Tab (40 mg total) by mouth Once a day   . VITAMIN D 50,000 unit Oral  Capsule TAKE ONE CAPSULE BY MOUTH ONCE WEEKLY     Allergies   Allergen Reactions   . Allergy-Decongestant [Dexbrompheniramine-Pseudoephed]    . Codeine    . Cortisone    . Motrin [Ibuprofen]      Past Medical History:   Diagnosis Date   . Anxiety    . Depression    . Diabetes mellitus, type 2 (CMS HCC)    . GERD (gastroesophageal reflux disease)    . History of concussion    . HTN (hypertension)    . Hyperlipidemia    . Stress    . Ulcerative colitis (CMS HCC)          Past Surgical History:   Procedure Laterality Date   . HX CESAREAN SECTION     . HX HYSTERECTOMY     . ORIF ANKLE FRACTURE           Family Medical History:     Problem Relation (Age of Onset)    Other Mother, Father            Social History   Substance Use Topics   .  Smoking status: Never Smoker   . Smokeless tobacco: Never Used   . Alcohol use No       Review of Systems   Constitutional: Negative.    HENT: Negative.    Eyes: Negative.    Respiratory: Negative.    Cardiovascular: Negative.    Gastrointestinal: Negative.    Endocrine: Negative.    Genitourinary: Negative.    Musculoskeletal: Negative.    Skin: Negative.    Allergic/Immunologic: Negative.    Neurological: Negative.    Hematological: Negative.    Psychiatric/Behavioral: Positive for sleep disturbance. The patient is nervous/anxious.      Objective:   Vitals: BP 124/62  Pulse (!) 101  Temp 36.6 C (97.8 F) (Tympanic)   Ht 1.575 m ( )  Wt 71.2 kg (157 lb)  SpO2 98%  BMI 28.72 kg/m2    Physical Exam   Constitutional: She is oriented to person, place, and time. She appears well-developed and well-nourished.   HENT:   Head: Normocephalic and atraumatic.   Eyes: Pupils are equal, round, and reactive to light. Conjunctivae and EOM are normal.   Neck: Normal range of motion. Neck supple.   Cardiovascular: Normal rate, regular rhythm, normal heart sounds and intact distal pulses.  Exam reveals no gallop and no friction rub.    No murmur heard.  Pulmonary/Chest: Effort normal and  breath sounds normal.   Abdominal: Soft. Bowel sounds are normal.   Musculoskeletal: Normal range of motion.   Neurological: She is alert and oriented to person, place, and time. She has normal reflexes.   Skin: Skin is warm and dry.   Psychiatric: She has a normal mood and affect. Her behavior is normal. Judgment and thought content normal.   Nursing note and vitals reviewed.    Assessment & Plan:     ENCOUNTER DIAGNOSES     ICD-10-CM   1. Mood disorder (CMS HCC) F39   2. Anxiety F41.9   3. Gastroesophageal reflux disease without esophagitis K21.9   4. Acquired hypothyroidism E03.9   5. Essential hypertension I10   6. Diabetes (CMS HCC) E11.9       Orders Placed This Encounter   . Refer to Provider-External   . OLANZapine (ZYPREXA) 2.5 mg Oral Tablet       Return in about 1 month (around 08/22/2017).    [name], PA-C    Patient seen independently

## 2017-07-29 ENCOUNTER — Other Ambulatory Visit (HOSPITAL_BASED_OUTPATIENT_CLINIC_OR_DEPARTMENT_OTHER): Payer: Self-pay | Admitting: PHYSICIAN ASSISTANT

## 2017-07-29 DIAGNOSIS — E119 Type 2 diabetes mellitus without complications: Secondary | ICD-10-CM

## 2017-07-29 MED ORDER — METFORMIN ER 750 MG TABLET,EXTENDED RELEASE 24 HR
750.0000 mg | ORAL_TABLET | Freq: Two times a day (BID) | ORAL | 5 refills | Status: DC
Start: 2017-07-29 — End: 2018-01-30

## 2017-07-29 NOTE — Telephone Encounter (Signed)
THIS MEDICATION NEEDS TO BE E-SCRIBED  Eulas Post, RN  07/29/2017, 14:13

## 2017-08-02 ENCOUNTER — Other Ambulatory Visit (HOSPITAL_BASED_OUTPATIENT_CLINIC_OR_DEPARTMENT_OTHER): Payer: Self-pay | Admitting: PHYSICIAN ASSISTANT

## 2017-08-02 MED ORDER — CLONAZEPAM 0.5 MG TABLET: 1 mg | Tab | Freq: Two times a day (BID) | ORAL | 1 refills | 0 days | Status: DC | PRN

## 2017-08-02 MED ORDER — AMLODIPINE 5 MG-OLMESARTAN 20 MG TABLET
1.0000 | ORAL_TABLET | Freq: Every day | ORAL | 3 refills | Status: DC
Start: 2017-08-02 — End: 2018-06-12

## 2017-08-02 NOTE — Telephone Encounter (Signed)
THIS MEDICATION NEEDS TO BE E-SCRIBED  Eulas Post, RN  08/02/2017, 09:31

## 2017-08-26 ENCOUNTER — Encounter (HOSPITAL_BASED_OUTPATIENT_CLINIC_OR_DEPARTMENT_OTHER): Payer: Self-pay | Admitting: PHYSICIAN ASSISTANT

## 2017-08-26 ENCOUNTER — Ambulatory Visit: Payer: Commercial Managed Care - PPO | Attending: Internal Medicine | Admitting: PHYSICIAN ASSISTANT

## 2017-08-26 VITALS — BP 124/78 | HR 101 | Temp 97.6°F | Ht 62.0 in | Wt 157.0 lb

## 2017-08-26 DIAGNOSIS — F411 Generalized anxiety disorder: Principal | ICD-10-CM | POA: Insufficient documentation

## 2017-08-26 DIAGNOSIS — E039 Hypothyroidism, unspecified: Secondary | ICD-10-CM | POA: Insufficient documentation

## 2017-08-26 DIAGNOSIS — I10 Essential (primary) hypertension: Secondary | ICD-10-CM

## 2017-08-26 DIAGNOSIS — K219 Gastro-esophageal reflux disease without esophagitis: Secondary | ICD-10-CM | POA: Insufficient documentation

## 2017-08-26 DIAGNOSIS — Z6828 Body mass index (BMI) 28.0-28.9, adult: Secondary | ICD-10-CM

## 2017-08-26 DIAGNOSIS — F331 Major depressive disorder, recurrent, moderate: Secondary | ICD-10-CM

## 2017-08-26 DIAGNOSIS — B370 Candidal stomatitis: Secondary | ICD-10-CM | POA: Insufficient documentation

## 2017-08-26 MED ORDER — NYSTATIN 100,000 UNIT/ML ORAL SUSPENSION
ORAL | 0 refills | Status: DC
Start: 2017-08-26 — End: 2017-10-07

## 2017-08-26 NOTE — Progress Notes (Signed)
[facility]  [address]  [place] [id number]  Phone: [phone]    Encounter Date: 08/26/2017    Patient ID:  Elizabeth BloodSusan Schnapp  UYQ:[id number]    DOB: [birth year]  Age: 71 y.o. female    Subjective:     Chief Complaint   Patient presents with   . Follow Up     1 month follow up.     The history is provided by the patient.   here for a follow up.  still having anxiety and depression.  feels maybe a little better.  has a sore mouth.  unable to wear dentures.  Current Outpatient Prescriptions   Medication Sig   . ACCU-CHEK AVIVA CONTROL SOLN Solution    . ACCU-CHEK SOFTCLIX LANCETS Misc    . amLODIPine (NORVASC) 5 mg Oral Tablet Take 5 mg by mouth Once a day   . Amlodipine-Olmesartan 5-20 mg Oral Tablet Take 1 Tab by mouth Once a day   . B COMPLEX-VITAMIN B12 Oral Tablet    . Calcium Carbonate-Vitamin D2 (OYSTER SHELL CALCIUM-VIT D2) 500 mg(1,250mg ) -200 unit Oral Tablet one a week   . clonazePAM (KLONOPIN) 0.5 mg Oral Tablet Take 1 Tab (0.5 mg total) by mouth Twice per day as needed   . cyanocobalamin (VITAMIN B12) 1,000 mcg/mL Injection Solution    . DAILY-VITE Oral Tablet    . DULoxetine (CYMBALTA DR) 60 mg Oral Capsule, Delayed Release(E.C.) Take 1 Cap (60 mg total) by mouth Once a day   . famotidine (PEPCID) 20 mg Oral Tablet    . Food Supplement, Lactose-Free (ENSURE ACTIVE HIGH PROTEIN) Oral Liquid Use daily to bid prn   . levothyroxine (SYNTHROID) 50 mcg Oral Tablet Take 1 Tab (50 mcg total) by mouth Every morning for 30 days   . metformin (GLUCOPHAGE-XR) 750 mg Oral Tablet Sustained Release 24 hr Take 1 Tab (750 mg total) by mouth Twice daily   . metoprolol succinate (TOPROL-XL) 25 mg Oral Tablet Sustained Release 24 hr Take 25 mg by mouth Once a day   . nystatin (MYCOSTATIN) 100,000 unit/mL Oral Suspension swish and swallow 5 ml tid   . OLANZapine (ZYPREXA) 2.5 mg Oral Tablet Take 1 Tab (2.5 mg total) by mouth Every night   . pantoprazole (PROTONIX) 40 mg Oral Tablet, Delayed Release (E.C.) Take 1 Tab  (40 mg total) by mouth Once a day   . VITAMIN D 50,000 unit Oral Capsule TAKE ONE CAPSULE BY MOUTH ONCE WEEKLY     Allergies   Allergen Reactions   . Allergy-Decongestant [Dexbrompheniramine-Pseudoephed]    . Codeine    . Cortisone    . Motrin [Ibuprofen]      Past Medical History:   Diagnosis Date   . Anxiety    . Depression    . Diabetes mellitus, type 2 (CMS HCC)    . GERD (gastroesophageal reflux disease)    . History of concussion    . HTN (hypertension)    . Hyperlipidemia    . Stress    . Ulcerative colitis (CMS HCC)          Past Surgical History:   Procedure Laterality Date   . HX CESAREAN SECTION     . HX HYSTERECTOMY     . ORIF ANKLE FRACTURE           Family Medical History:     Problem Relation (Age of Onset)    Other Mother, Father  Social History   Substance Use Topics   . Smoking status: Never Smoker   . Smokeless tobacco: Never Used   . Alcohol use No       Review of Systems   Constitutional: Negative.    HENT: Positive for mouth sores.    Eyes: Negative.    Respiratory: Negative.    Cardiovascular: Negative.    Gastrointestinal: Negative.    Endocrine: Negative.    Genitourinary: Negative.    Musculoskeletal: Negative.    Skin: Negative.    Allergic/Immunologic: Negative.    Neurological: Negative.    Hematological: Negative.    Psychiatric/Behavioral: Positive for sleep disturbance. Negative for self-injury and suicidal ideas. The patient is nervous/anxious.      Objective:   Vitals: BP 124/78  Pulse (!) 101  Temp 36.4 C (97.6 F) (Tympanic)   Ht 1.575 m (5\' 2" )  Wt 71.2 kg (157 lb)  SpO2 98%  BMI 28.72 kg/m2    Physical Exam   Constitutional: She is oriented to person, place, and time. She appears well-developed and well-nourished.   HENT:   Head: Normocephalic and atraumatic.   thrush noted to oral mucosa   Eyes: Pupils are equal, round, and reactive to light. Conjunctivae and EOM are normal.   Neck: Normal range of motion. Neck supple.   Cardiovascular: Normal rate, regular  rhythm, normal heart sounds and intact distal pulses.  Exam reveals no gallop and no friction rub.    No murmur heard.  Pulmonary/Chest: Effort normal and breath sounds normal.   Abdominal: Soft. Bowel sounds are normal.   Musculoskeletal: Normal range of motion.   Neurological: She is alert and oriented to person, place, and time. She has normal reflexes.   Skin: Skin is warm and dry.   Psychiatric: She has a normal mood and affect. Her behavior is normal. Judgment and thought content normal.   Nursing note and vitals reviewed.    Assessment & Plan:     ENCOUNTER DIAGNOSES     ICD-10-CM   1. Generalized anxiety disorder F41.1   2. 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 Gastroesophageal reflux disease without esophagitis K21.9   4. Acquired hypothyroidism E03.9   5. Essential hypertension I10   6. Thrush B37.0       Orders Placed This Encounter   . nystatin (MYCOSTATIN) 100,000 unit/mL Oral Suspension       Return in about 3 months (around 11/26/2017).    Farrel GordonKatie J Inclan, PA-C    Patient seen independently

## 2017-09-20 ENCOUNTER — Ambulatory Visit: Payer: Commercial Managed Care - PPO | Attending: PHYSICIAN ASSISTANT | Admitting: PHYSICIAN ASSISTANT

## 2017-09-20 ENCOUNTER — Encounter (HOSPITAL_BASED_OUTPATIENT_CLINIC_OR_DEPARTMENT_OTHER): Payer: Self-pay | Admitting: PHYSICIAN ASSISTANT

## 2017-09-20 VITALS — BP 118/62 | HR 103 | Temp 97.8°F | Ht 62.0 in | Wt 163.0 lb

## 2017-09-20 DIAGNOSIS — F39 Unspecified mood [affective] disorder: Secondary | ICD-10-CM

## 2017-09-20 DIAGNOSIS — E039 Hypothyroidism, unspecified: Secondary | ICD-10-CM

## 2017-09-20 DIAGNOSIS — F411 Generalized anxiety disorder: Secondary | ICD-10-CM

## 2017-09-20 DIAGNOSIS — Z7984 Long term (current) use of oral hypoglycemic drugs: Secondary | ICD-10-CM | POA: Insufficient documentation

## 2017-09-20 DIAGNOSIS — E119 Type 2 diabetes mellitus without complications: Secondary | ICD-10-CM

## 2017-09-20 DIAGNOSIS — K219 Gastro-esophageal reflux disease without esophagitis: Secondary | ICD-10-CM | POA: Insufficient documentation

## 2017-09-20 DIAGNOSIS — Z79899 Other long term (current) drug therapy: Secondary | ICD-10-CM | POA: Insufficient documentation

## 2017-09-20 DIAGNOSIS — Z6829 Body mass index (BMI) 29.0-29.9, adult: Secondary | ICD-10-CM

## 2017-09-24 ENCOUNTER — Encounter (HOSPITAL_BASED_OUTPATIENT_CLINIC_OR_DEPARTMENT_OTHER): Payer: Self-pay | Admitting: PHYSICIAN ASSISTANT

## 2017-09-24 NOTE — Progress Notes (Signed)
[facility]  [address]  [place] [id number]  Phone: [phone]    Encounter Date: 09/20/2017    Patient ID:  Elizabeth BloodSusan Friedhoff  [id number]    DOB: [birth year]  Age: 71 y.o. female    Subjective:     Chief Complaint   Patient presents with   . Follow Up     follow up from last visit.     here for follow up.  continues with anxiety.   she lives a lone and is very lonely.  depressed at times.      The history is provided by the patient.     Current Outpatient Medications   Medication Sig   . ACCU-CHEK AVIVA CONTROL SOLN Solution    . ACCU-CHEK SOFTCLIX LANCETS Misc    . amLODIPine (NORVASC) 5 mg Oral Tablet Take 5 mg by mouth Once a day   . Amlodipine-Olmesartan 5-20 mg Oral Tablet Take 1 Tab by mouth Once a day   . B COMPLEX-VITAMIN B12 Oral Tablet    . Calcium Carbonate-Vitamin D2 (OYSTER SHELL CALCIUM-VIT D2) 500 mg(1,250mg ) -200 unit Oral Tablet one a week   . clonazePAM (KLONOPIN) 0.5 mg Oral Tablet Take 1 Tab (0.5 mg total) by mouth Twice per day as needed   . cyanocobalamin (VITAMIN B12) 1,000 mcg/mL Injection Solution    . DAILY-VITE Oral Tablet    . DULoxetine (CYMBALTA DR) 60 mg Oral Capsule, Delayed Release(E.C.) Take 1 Cap (60 mg total) by mouth Once a day   . famotidine (PEPCID) 20 mg Oral Tablet    . Food Supplement, Lactose-Free (ENSURE ACTIVE HIGH PROTEIN) Oral Liquid Use daily to bid prn   . levothyroxine (SYNTHROID) 50 mcg Oral Tablet Take 1 Tab (50 mcg total) by mouth Every morning for 30 days   . metformin (GLUCOPHAGE-XR) 750 mg Oral Tablet Sustained Release 24 hr Take 1 Tab (750 mg total) by mouth Twice daily   . metoprolol succinate (TOPROL-XL) 25 mg Oral Tablet Sustained Release 24 hr Take 25 mg by mouth Once a day   . nystatin (MYCOSTATIN) 100,000 unit/mL Oral Suspension swish and swallow 5 ml tid   . OLANZapine (ZYPREXA) 2.5 mg Oral Tablet Take 1 Tab (2.5 mg total) by mouth Every night   . pantoprazole (PROTONIX) 40 mg Oral Tablet, Delayed Release (E.C.) Take 1 Tab (40 mg total) by  mouth Once a day   . VITAMIN D 50,000 unit Oral Capsule TAKE ONE CAPSULE BY MOUTH ONCE WEEKLY     Allergies   Allergen Reactions   . Allergy-Decongestant [Dexbrompheniramine-Pseudoephed]    . Codeine    . Cortisone    . Motrin [Ibuprofen]      Past Medical History:   Diagnosis Date   . Anxiety    . Depression    . Diabetes mellitus, type 2 (CMS HCC)    . GERD (gastroesophageal reflux disease)    . History of concussion    . HTN (hypertension)    . Hyperlipidemia    . Stress    . Ulcerative colitis (CMS HCC)          Past Surgical History:   Procedure Laterality Date   . HX CESAREAN SECTION     . HX HYSTERECTOMY     . ORIF ANKLE FRACTURE           Family Medical History:     Problem Relation (Age of Onset)    Other Mother, Father  Social History     Tobacco Use   . Smoking status: Never Smoker   . Smokeless tobacco: Never Used   Substance Use Topics   . Alcohol use: No   . Drug use: Not on file       Review of Systems   Constitutional: Negative.    HENT: Negative.    Eyes: Negative.    Respiratory: Negative.    Cardiovascular: Negative.    Gastrointestinal: Negative.    Endocrine: Negative.    Genitourinary: Negative.    Musculoskeletal: Positive for arthralgias.   Skin: Negative.    Allergic/Immunologic: Negative.    Neurological: Negative.    Hematological: Negative.    Psychiatric/Behavioral: Positive for sleep disturbance. Negative for self-injury and suicidal ideas. The patient is nervous/anxious.      Objective:   Vitals: BP 118/62   Pulse (!) 103   Temp 36.6 C (97.8 F) (Tympanic)   Ht 1.575 m (5\' 2" )   Wt 73.9 kg (163 lb)   SpO2 97%   BMI 29.81 kg/m         Physical Exam   Constitutional: She is oriented to person, place, and time. She appears well-developed and well-nourished.   HENT:   Head: Normocephalic and atraumatic.   Eyes: Pupils are equal, round, and reactive to light. Conjunctivae and EOM are normal.   Neck: Normal range of motion. Neck supple.   Cardiovascular: Normal rate,  regular rhythm, normal heart sounds and intact distal pulses. Exam reveals no gallop and no friction rub.   No murmur heard.  Pulmonary/Chest: Effort normal and breath sounds normal.   Abdominal: Soft. Bowel sounds are normal.   Musculoskeletal: Normal range of motion.   Neurological: She is alert and oriented to person, place, and time. She has normal reflexes.   Skin: Skin is warm and dry.   Psychiatric: She has a normal mood and affect. Her behavior is normal. Judgment and thought content normal.   Nursing note and vitals reviewed.    Assessment & Plan:     ENCOUNTER DIAGNOSES     ICD-10-CM   1. Type 2 diabetes mellitus without complication, without long-term current use of insulin (CMS HCC) E11.9   2. Acquired hypothyroidism E03.9   3. Generalized anxiety disorder F41.1   4. Mood disorder (CMS HCC) F39   5. Gastroesophageal reflux disease without esophagitis K21.9       Orders Placed This Encounter   . CBC/DIFF   . Comp Metabolic Panel-Fasting   . Hemoglobin A1C   . Lipid Panel   . TSH w/ Free T4 Reflex       rec she get an emotional support animal..    Farrel GordonKatie J Inclan, PA-C

## 2017-10-07 ENCOUNTER — Ambulatory Visit (HOSPITAL_BASED_OUTPATIENT_CLINIC_OR_DEPARTMENT_OTHER): Payer: Self-pay | Admitting: PHYSICIAN ASSISTANT

## 2017-10-07 ENCOUNTER — Other Ambulatory Visit (HOSPITAL_BASED_OUTPATIENT_CLINIC_OR_DEPARTMENT_OTHER): Payer: Self-pay | Admitting: PHYSICIAN ASSISTANT

## 2017-10-07 MED ORDER — NYSTATIN 100,000 UNIT/ML ORAL SUSPENSION
ORAL | 0 refills | Status: DC
Start: 2017-10-07 — End: 2017-10-21

## 2017-10-13 ENCOUNTER — Telehealth (HOSPITAL_BASED_OUTPATIENT_CLINIC_OR_DEPARTMENT_OTHER): Payer: Self-pay | Admitting: PHYSICIAN ASSISTANT

## 2017-10-13 NOTE — Telephone Encounter (Signed)
Elizabeth Page called in, saying she is still losing weight, hasn't had much to eat but has been drinking a lot of water. Patient stated she can't find anyone to take her to the store for food. Has been feeling weak and nauseous, patient states she thinks it is from her GERD. Would like to know what she could start taking. Has appointment next Friday    Elizabeth Page, Ambulatory Care Assistant

## 2017-10-14 ENCOUNTER — Other Ambulatory Visit (HOSPITAL_BASED_OUTPATIENT_CLINIC_OR_DEPARTMENT_OTHER): Payer: Self-pay | Admitting: PHYSICIAN ASSISTANT

## 2017-10-14 MED ORDER — SUCRALFATE 1 GRAM TABLET: 1 g | Tab | Freq: Three times a day (TID) | ORAL | 1 refills | 0 days | Status: DC

## 2017-10-14 NOTE — Telephone Encounter (Signed)
Called patient told her rx was at the pharmacy.    Ave FilterVictoria Deblasis, Ambulatory Care Assistant

## 2017-10-21 ENCOUNTER — Encounter (HOSPITAL_BASED_OUTPATIENT_CLINIC_OR_DEPARTMENT_OTHER): Payer: Self-pay | Admitting: PHYSICIAN ASSISTANT

## 2017-10-21 ENCOUNTER — Ambulatory Visit: Payer: Commercial Managed Care - PPO | Attending: PHYSICIAN ASSISTANT | Admitting: PHYSICIAN ASSISTANT

## 2017-10-21 VITALS — BP 126/62 | HR 111 | Temp 97.6°F | Ht 62.0 in | Wt 162.0 lb

## 2017-10-21 DIAGNOSIS — R5383 Other fatigue: Secondary | ICD-10-CM | POA: Insufficient documentation

## 2017-10-21 DIAGNOSIS — E785 Hyperlipidemia, unspecified: Secondary | ICD-10-CM | POA: Insufficient documentation

## 2017-10-21 DIAGNOSIS — B370 Candidal stomatitis: Secondary | ICD-10-CM | POA: Insufficient documentation

## 2017-10-21 DIAGNOSIS — Z79899 Other long term (current) drug therapy: Secondary | ICD-10-CM | POA: Insufficient documentation

## 2017-10-21 DIAGNOSIS — I10 Essential (primary) hypertension: Secondary | ICD-10-CM | POA: Insufficient documentation

## 2017-10-21 DIAGNOSIS — F39 Unspecified mood [affective] disorder: Secondary | ICD-10-CM

## 2017-10-21 DIAGNOSIS — F419 Anxiety disorder, unspecified: Secondary | ICD-10-CM | POA: Insufficient documentation

## 2017-10-21 DIAGNOSIS — E1165 Type 2 diabetes mellitus with hyperglycemia: Secondary | ICD-10-CM

## 2017-10-21 DIAGNOSIS — F411 Generalized anxiety disorder: Secondary | ICD-10-CM | POA: Insufficient documentation

## 2017-10-21 DIAGNOSIS — Z7984 Long term (current) use of oral hypoglycemic drugs: Secondary | ICD-10-CM | POA: Insufficient documentation

## 2017-10-21 DIAGNOSIS — E039 Hypothyroidism, unspecified: Principal | ICD-10-CM | POA: Insufficient documentation

## 2017-10-21 DIAGNOSIS — Z6829 Body mass index (BMI) 29.0-29.9, adult: Secondary | ICD-10-CM

## 2017-10-21 DIAGNOSIS — F329 Major depressive disorder, single episode, unspecified: Secondary | ICD-10-CM | POA: Insufficient documentation

## 2017-10-21 DIAGNOSIS — K219 Gastro-esophageal reflux disease without esophagitis: Secondary | ICD-10-CM | POA: Insufficient documentation

## 2017-10-21 MED ORDER — FLUCONAZOLE 150 MG TABLET
150.0000 mg | ORAL_TABLET | Freq: Once | ORAL | 0 refills | Status: AC
Start: 2017-10-21 — End: 2017-10-21

## 2017-10-21 MED ORDER — NYSTATIN 100,000 UNIT/ML ORAL SUSPENSION: mL | ORAL | 0 refills | 0 days | Status: DC

## 2017-10-21 MED ORDER — DULOXETINE 60 MG CAPSULE,DELAYED RELEASE
60.0000 mg | DELAYED_RELEASE_CAPSULE | Freq: Every day | ORAL | 5 refills | Status: DC
Start: 2017-10-21 — End: 2018-05-09

## 2017-10-24 ENCOUNTER — Other Ambulatory Visit (HOSPITAL_BASED_OUTPATIENT_CLINIC_OR_DEPARTMENT_OTHER): Payer: Self-pay | Admitting: PHYSICIAN ASSISTANT

## 2017-10-24 ENCOUNTER — Encounter (HOSPITAL_BASED_OUTPATIENT_CLINIC_OR_DEPARTMENT_OTHER): Payer: Self-pay | Admitting: PHYSICIAN ASSISTANT

## 2017-10-24 NOTE — Progress Notes (Signed)
[facility]  [address] [place] [address]  Phone: [phone]    Encounter Date: 10/21/2017    Patient ID:  Elizabeth Page  WGN:[id number]    DOB: April 08, 1946  Age: 72 y.o. female    Subjective:     Chief Complaint   Patient presents with   . Weight Loss     has been losing weight and muscle.     here for a follow up.  having a lot of anxiety.  wants to see psych.  she states she is very lonely.  worries about everything.  continues with a sore mouth.  nystatin is helping.          Current Outpatient Medications   Medication Sig   . ACCU-CHEK AVIVA CONTROL SOLN Solution    . ACCU-CHEK SOFTCLIX LANCETS Misc    . amLODIPine (NORVASC) 5 mg Oral Tablet Take 5 mg by mouth Once a day   . Amlodipine-Olmesartan 5-20 mg Oral Tablet Take 1 Tab by mouth Once a day   . B COMPLEX-VITAMIN B12 Oral Tablet    . Calcium Carbonate-Vitamin D2 (OYSTER SHELL CALCIUM-VIT D2) 500 mg(1,250mg ) -200 unit Oral Tablet one a week   . clonazePAM (KLONOPIN) 0.5 mg Oral Tablet Take 1 Tab (0.5 mg total) by mouth Twice per day as needed   . cyanocobalamin (VITAMIN B12) 1,000 mcg/mL Injection Solution    . DAILY-VITE Oral Tablet    . DULoxetine (CYMBALTA DR) 60 mg Oral Capsule, Delayed Release(E.C.) Take 1 Cap (60 mg total) by mouth Once a day   . famotidine (PEPCID) 20 mg Oral Tablet    . Food Supplement, Lactose-Free (ENSURE ACTIVE HIGH PROTEIN) Oral Liquid Use daily to bid prn   . levothyroxine (SYNTHROID) 50 mcg Oral Tablet Take 1 Tab (50 mcg total) by mouth Every morning for 30 days   . metformin (GLUCOPHAGE-XR) 750 mg Oral Tablet Sustained Release 24 hr Take 1 Tab (750 mg total) by mouth Twice daily   . metoprolol succinate (TOPROL-XL) 25 mg Oral Tablet Sustained Release 24 hr Take 25 mg by mouth Once a day   . nystatin (MYCOSTATIN) 100,000 unit/mL Oral Suspension swish and swallow 5 ml tid   . OLANZapine (ZYPREXA) 2.5 mg Oral Tablet Take 1 Tab (2.5 mg total) by mouth Every night   . pantoprazole (PROTONIX) 40 mg Oral Tablet, Delayed  Release (E.C.) Take 1 Tab (40 mg total) by mouth Once a day   . sucralfate (CARAFATE) 1 gram Oral Tablet Take 1 Tab (1 g total) by mouth Three times daily before meals   . VITAMIN D 50,000 unit Oral Capsule TAKE ONE CAPSULE BY MOUTH ONCE WEEKLY     Allergies   Allergen Reactions   . Allergy-Decongestant [Dexbrompheniramine-Pseudoephed]    . Codeine    . Cortisone    . Motrin [Ibuprofen]      Past Medical History:   Diagnosis Date   . Anxiety    . Depression    . Diabetes mellitus, type 2 (CMS HCC)    . GERD (gastroesophageal reflux disease)    . History of concussion    . HTN (hypertension)    . Hyperlipidemia    . Stress    . Ulcerative colitis (CMS HCC)          Past Surgical History:   Procedure Laterality Date   . HX CESAREAN SECTION     . HX HYSTERECTOMY     . ORIF ANKLE FRACTURE  Family Medical History:     Problem Relation (Age of Onset)    Other Mother, Father            Social History     Tobacco Use   . Smoking status: Never Smoker   . Smokeless tobacco: Never Used   Substance Use Topics   . Alcohol use: No   . Drug use: Not on file       Review of Systems   Constitutional: Positive for fatigue.   HENT: Positive for mouth sores.    Eyes: Negative.    Respiratory: Negative.    Cardiovascular: Negative.    Gastrointestinal: Negative.    Endocrine: Negative.    Genitourinary: Negative.    Musculoskeletal: Positive for arthralgias.   Skin: Negative.    Allergic/Immunologic: Negative.    Neurological: Negative.    Hematological: Negative.    Psychiatric/Behavioral: Positive for sleep disturbance. Negative for self-injury and suicidal ideas. The patient is nervous/anxious.      Objective:   Vitals: BP 126/62   Pulse (!) 111   Temp 36.4 C (97.6 F) (Tympanic)   Ht 1.575 m (5\' 2" )   Wt 73.5 kg (162 lb)   SpO2 98%   BMI 29.63 kg/m         Physical Exam   Constitutional: She is oriented to person, place, and time. She appears well-developed and well-nourished.   HENT:   Head: Normocephalic and  atraumatic.   Eyes: Pupils are equal, round, and reactive to light. Conjunctivae and EOM are normal.   Neck: Normal range of motion. Neck supple.   Cardiovascular: Normal rate, regular rhythm, normal heart sounds and intact distal pulses. Exam reveals no gallop and no friction rub.   No murmur heard.  Pulmonary/Chest: Effort normal and breath sounds normal.   Abdominal: Soft. Bowel sounds are normal.   Musculoskeletal: Normal range of motion.   Neurological: She is alert and oriented to person, place, and time. She has normal reflexes.   Skin: Skin is warm and dry.   Psychiatric: Her behavior is normal. Judgment and thought content normal. Her mood appears anxious.   Nursing note and vitals reviewed.    Assessment & Plan:     ENCOUNTER DIAGNOSES     ICD-10-CM   1. Acquired hypothyroidism E03.9   2. Type 2 diabetes mellitus with hyperglycemia, without long-term current use of insulin (CMS HCC) E11.65   3. Mood disorder (CMS HCC) F39   4. Generalized anxiety disorder F41.1   5. Thrush B37.0       Orders Placed This Encounter   . TSH w/ Free T4 Reflex   . Hemoglobin A1C   . CBC/DIFF   . Comp Metabolic Panel-Fasting   . Lipid Panel   . Refer to Provider-External   . DULoxetine (CYMBALTA DR) 60 mg Oral Capsule, Delayed Release(E.C.)   . fluconazole (DIFLUCAN) 150 mg Oral Tablet   . nystatin (MYCOSTATIN) 100,000 unit/mL Oral Suspension       Return in about 2 months (around 12/19/2017).    Farrel GordonKatie J Inclan, PA-C

## 2017-10-24 NOTE — Telephone Encounter (Signed)
[facility]

## 2017-10-25 MED ORDER — CLONAZEPAM 0.5 MG TABLET: 1 mg | Tab | Freq: Two times a day (BID) | ORAL | 1 refills | 0 days | Status: DC | PRN

## 2017-10-28 ENCOUNTER — Ambulatory Visit (HOSPITAL_BASED_OUTPATIENT_CLINIC_OR_DEPARTMENT_OTHER): Payer: Self-pay | Admitting: PHYSICIAN ASSISTANT

## 2017-11-08 ENCOUNTER — Encounter (HOSPITAL_BASED_OUTPATIENT_CLINIC_OR_DEPARTMENT_OTHER): Payer: Self-pay | Admitting: PHYSICIAN ASSISTANT

## 2017-11-08 ENCOUNTER — Ambulatory Visit: Payer: Commercial Managed Care - PPO | Attending: PHYSICIAN ASSISTANT | Admitting: PHYSICIAN ASSISTANT

## 2017-11-08 VITALS — BP 126/64 | HR 95 | Temp 97.6°F | Ht 62.0 in | Wt 161.0 lb

## 2017-11-08 DIAGNOSIS — K219 Gastro-esophageal reflux disease without esophagitis: Secondary | ICD-10-CM | POA: Insufficient documentation

## 2017-11-08 DIAGNOSIS — Z888 Allergy status to other drugs, medicaments and biological substances status: Secondary | ICD-10-CM

## 2017-11-08 DIAGNOSIS — M150 Primary generalized (osteo)arthritis: Secondary | ICD-10-CM | POA: Insufficient documentation

## 2017-11-08 DIAGNOSIS — Z885 Allergy status to narcotic agent status: Secondary | ICD-10-CM

## 2017-11-08 DIAGNOSIS — I10 Essential (primary) hypertension: Secondary | ICD-10-CM | POA: Insufficient documentation

## 2017-11-08 DIAGNOSIS — F419 Anxiety disorder, unspecified: Secondary | ICD-10-CM | POA: Insufficient documentation

## 2017-11-08 DIAGNOSIS — Z7984 Long term (current) use of oral hypoglycemic drugs: Secondary | ICD-10-CM

## 2017-11-08 DIAGNOSIS — M159 Polyosteoarthritis, unspecified: Secondary | ICD-10-CM

## 2017-11-08 DIAGNOSIS — F39 Unspecified mood [affective] disorder: Secondary | ICD-10-CM | POA: Insufficient documentation

## 2017-11-08 DIAGNOSIS — Z886 Allergy status to analgesic agent status: Secondary | ICD-10-CM

## 2017-11-08 DIAGNOSIS — Z79899 Other long term (current) drug therapy: Secondary | ICD-10-CM | POA: Insufficient documentation

## 2017-11-08 DIAGNOSIS — E119 Type 2 diabetes mellitus without complications: Secondary | ICD-10-CM

## 2017-11-08 MED ORDER — FLUCONAZOLE 100 MG TABLET
100.0000 mg | ORAL_TABLET | Freq: Every day | ORAL | 0 refills | Status: AC
Start: 2017-11-08 — End: 2017-11-15

## 2017-11-13 ENCOUNTER — Encounter (HOSPITAL_BASED_OUTPATIENT_CLINIC_OR_DEPARTMENT_OTHER): Payer: Self-pay | Admitting: PHYSICIAN ASSISTANT

## 2017-11-13 DIAGNOSIS — E119 Type 2 diabetes mellitus without complications: Secondary | ICD-10-CM | POA: Insufficient documentation

## 2017-11-13 DIAGNOSIS — F39 Unspecified mood [affective] disorder: Secondary | ICD-10-CM | POA: Insufficient documentation

## 2017-11-13 DIAGNOSIS — I10 Essential (primary) hypertension: Secondary | ICD-10-CM | POA: Insufficient documentation

## 2017-11-13 DIAGNOSIS — E1165 Type 2 diabetes mellitus with hyperglycemia: Secondary | ICD-10-CM | POA: Insufficient documentation

## 2017-11-13 DIAGNOSIS — K219 Gastro-esophageal reflux disease without esophagitis: Secondary | ICD-10-CM | POA: Insufficient documentation

## 2017-11-13 DIAGNOSIS — F419 Anxiety disorder, unspecified: Secondary | ICD-10-CM | POA: Insufficient documentation

## 2017-11-13 NOTE — Progress Notes (Signed)
[facility]  [address]  [place] [address]  Phone: [phone]    Encounter Date: 11/08/2017    Patient ID:  Elizabeth BloodSusan Newberry  WGN:[id number]    DOB: 05/10/1946  Age: 72 y.o. female    Subjective:     Chief Complaint   Patient presents with   . Decreased Appetite     not eating well, not sleeping and fatigued.     having a lot of anxiety and depression. was referred to psych at last visit.  she has not made her appt yet.  having anxiety and depression.          Current Outpatient Medications   Medication Sig   . ACCU-CHEK AVIVA CONTROL SOLN Solution    . ACCU-CHEK SOFTCLIX LANCETS Misc    . Amlodipine-Olmesartan 5-20 mg Oral Tablet Take 1 Tab by mouth Once a day   . B COMPLEX-VITAMIN B12 Oral Tablet    . Calcium Carbonate-Vitamin D2 (OYSTER SHELL CALCIUM-VIT D2) 500 mg(1,250mg ) -200 unit Oral Tablet one a week   . clonazePAM (KLONOPIN) 0.5 mg Oral Tablet Take 1 Tab (0.5 mg total) by mouth Twice per day as needed   . cyanocobalamin (VITAMIN B12) 1,000 mcg/mL Injection Solution    . DAILY-VITE Oral Tablet    . DULoxetine (CYMBALTA DR) 60 mg Oral Capsule, Delayed Release(E.C.) Take 1 Cap (60 mg total) by mouth Once a day   . famotidine (PEPCID) 20 mg Oral Tablet    . fluconazole (DIFLUCAN) 100 mg Oral Tablet Take 1 Tab (100 mg total) by mouth Once a day for 7 days   . Food Supplement, Lactose-Free (ENSURE ACTIVE HIGH PROTEIN) Oral Liquid Use daily to bid prn   . levothyroxine (SYNTHROID) 50 mcg Oral Tablet Take 1 Tab (50 mcg total) by mouth Every morning for 30 days   . metformin (GLUCOPHAGE-XR) 750 mg Oral Tablet Sustained Release 24 hr Take 1 Tab (750 mg total) by mouth Twice daily   . nystatin (MYCOSTATIN) 100,000 unit/mL Oral Suspension swish and swallow 5 ml tid   . OLANZapine (ZYPREXA) 2.5 mg Oral Tablet Take 1 Tab (2.5 mg total) by mouth Every night   . pantoprazole (PROTONIX) 40 mg Oral Tablet, Delayed Release (E.C.) Take 1 Tab (40 mg total) by mouth Once a day   . sucralfate (CARAFATE) 1 gram Oral  Tablet Take 1 Tab (1 g total) by mouth Three times daily before meals   . VITAMIN D 50,000 unit Oral Capsule TAKE ONE CAPSULE BY MOUTH ONCE WEEKLY     Allergies   Allergen Reactions   . Allergy-Decongestant [Dexbrompheniramine-Pseudoephed]    . Codeine    . Cortisone    . Motrin [Ibuprofen]      Past Medical History:   Diagnosis Date   . Anxiety    . Depression    . Diabetes mellitus, type 2 (CMS HCC)    . GERD (gastroesophageal reflux disease)    . History of concussion    . HTN (hypertension)    . Hyperlipidemia    . Stress    . Ulcerative colitis (CMS HCC)          Past Surgical History:   Procedure Laterality Date   . HX CESAREAN SECTION     . HX HYSTERECTOMY     . ORIF ANKLE FRACTURE           Family Medical History:     Problem Relation (Age of Onset)    Other Mother, Father  Social History     Tobacco Use   . Smoking status: Never Smoker   . Smokeless tobacco: Never Used   Substance Use Topics   . Alcohol use: No   . Drug use: Not on file       Review of Systems   Constitutional: Positive for fatigue.   HENT: Negative.    Eyes: Negative.    Respiratory: Negative.    Cardiovascular: Negative.    Gastrointestinal: Negative.    Endocrine: Negative.    Genitourinary: Negative.    Musculoskeletal: Positive for arthralgias.   Skin: Negative.    Allergic/Immunologic: Negative.    Neurological: Negative.    Hematological: Negative.    Psychiatric/Behavioral: Positive for sleep disturbance. Negative for self-injury and suicidal ideas. The patient is nervous/anxious.      Objective:   Vitals: BP 126/64   Pulse 95   Temp 36.4 C (97.6 F) (Tympanic)   Ht 1.575 m (5\' 2" )   Wt 73 kg (161 lb)   SpO2 98%   BMI 29.45 kg/m         Physical Exam   Constitutional: She is oriented to person, place, and time. She appears well-developed and well-nourished.   HENT:   Head: Normocephalic and atraumatic.   Eyes: Pupils are equal, round, and reactive to light. Conjunctivae and EOM are normal.   Neck: Normal range of  motion. Neck supple.   Cardiovascular: Normal rate, regular rhythm, normal heart sounds and intact distal pulses. Exam reveals no gallop and no friction rub.   No murmur heard.  Pulmonary/Chest: Effort normal and breath sounds normal.   Abdominal: Soft. Bowel sounds are normal.   Musculoskeletal: Normal range of motion.   Neurological: She is alert and oriented to person, place, and time. She has normal reflexes.   Skin: Skin is warm and dry.   Psychiatric: She has a normal mood and affect. Her behavior is normal. Judgment and thought content normal.   Nursing note and vitals reviewed.    Assessment & Plan:     ENCOUNTER DIAGNOSES     ICD-10-CM   1. Mood disorder (CMS HCC) F39   2. 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 Anxiety F41.9   4. Type 2 diabetes mellitus without complication, without long-term current use of insulin (CMS HCC) E11.9   5. Essential hypertension I10   6. GERD without esophagitis K21.9       Orders Placed This Encounter   . fluconazole (DIFLUCAN) 100 mg Oral Tablet       No follow-ups on file.    Elizabeth Gordon, PA-C

## 2017-12-06 ENCOUNTER — Other Ambulatory Visit (HOSPITAL_BASED_OUTPATIENT_CLINIC_OR_DEPARTMENT_OTHER): Payer: Self-pay | Admitting: PHYSICIAN ASSISTANT

## 2017-12-06 DIAGNOSIS — E039 Hypothyroidism, unspecified: Secondary | ICD-10-CM

## 2017-12-06 MED ORDER — LEVOTHYROXINE 50 MCG TABLET: 50 ug | Tab | Freq: Every morning | ORAL | 5 refills | 0 days | Status: DC

## 2017-12-20 ENCOUNTER — Ambulatory Visit (HOSPITAL_BASED_OUTPATIENT_CLINIC_OR_DEPARTMENT_OTHER): Payer: Self-pay | Admitting: PHYSICIAN ASSISTANT

## 2017-12-22 ENCOUNTER — Other Ambulatory Visit (HOSPITAL_BASED_OUTPATIENT_CLINIC_OR_DEPARTMENT_OTHER): Payer: Self-pay | Admitting: PHYSICIAN ASSISTANT

## 2017-12-23 MED ORDER — CLONAZEPAM 0.5 MG TABLET: 1 mg | Tab | Freq: Two times a day (BID) | ORAL | 1 refills | 0 days | Status: DC | PRN

## 2018-01-03 ENCOUNTER — Ambulatory Visit: Payer: Commercial Managed Care - PPO | Attending: PHYSICIAN ASSISTANT | Admitting: PHYSICIAN ASSISTANT

## 2018-01-03 ENCOUNTER — Encounter (HOSPITAL_BASED_OUTPATIENT_CLINIC_OR_DEPARTMENT_OTHER): Payer: Self-pay | Admitting: PHYSICIAN ASSISTANT

## 2018-01-03 VITALS — BP 130/64 | HR 109 | Temp 97.6°F | Ht 62.0 in | Wt 163.0 lb

## 2018-01-03 DIAGNOSIS — Z09 Encounter for follow-up examination after completed treatment for conditions other than malignant neoplasm: Secondary | ICD-10-CM | POA: Insufficient documentation

## 2018-01-03 DIAGNOSIS — E538 Deficiency of other specified B group vitamins: Secondary | ICD-10-CM | POA: Insufficient documentation

## 2018-01-03 DIAGNOSIS — K219 Gastro-esophageal reflux disease without esophagitis: Secondary | ICD-10-CM | POA: Insufficient documentation

## 2018-01-03 DIAGNOSIS — Z7984 Long term (current) use of oral hypoglycemic drugs: Secondary | ICD-10-CM | POA: Insufficient documentation

## 2018-01-03 DIAGNOSIS — F39 Unspecified mood [affective] disorder: Secondary | ICD-10-CM

## 2018-01-03 DIAGNOSIS — F419 Anxiety disorder, unspecified: Secondary | ICD-10-CM | POA: Insufficient documentation

## 2018-01-03 DIAGNOSIS — E785 Hyperlipidemia, unspecified: Secondary | ICD-10-CM | POA: Insufficient documentation

## 2018-01-03 DIAGNOSIS — Z79899 Other long term (current) drug therapy: Secondary | ICD-10-CM | POA: Insufficient documentation

## 2018-01-03 DIAGNOSIS — E119 Type 2 diabetes mellitus without complications: Secondary | ICD-10-CM | POA: Insufficient documentation

## 2018-01-03 DIAGNOSIS — Z888 Allergy status to other drugs, medicaments and biological substances status: Secondary | ICD-10-CM | POA: Insufficient documentation

## 2018-01-03 DIAGNOSIS — Z885 Allergy status to narcotic agent status: Secondary | ICD-10-CM | POA: Insufficient documentation

## 2018-01-03 DIAGNOSIS — F329 Major depressive disorder, single episode, unspecified: Secondary | ICD-10-CM | POA: Insufficient documentation

## 2018-01-03 DIAGNOSIS — Z886 Allergy status to analgesic agent status: Secondary | ICD-10-CM | POA: Insufficient documentation

## 2018-01-03 DIAGNOSIS — I10 Essential (primary) hypertension: Secondary | ICD-10-CM | POA: Insufficient documentation

## 2018-01-03 MED ORDER — CYANOCOBALAMIN (VIT B-12) 1,000 MCG/ML INJECTION SOLUTION
1000.0000 ug | Freq: Every day | INTRAMUSCULAR | 0 refills | Status: DC
Start: 2018-01-03 — End: 2018-06-09

## 2018-01-03 MED ORDER — FERROUS SULFATE 325 MG (65 MG IRON) TABLET
325.0000 mg | ORAL_TABLET | Freq: Every day | ORAL | 3 refills | Status: DC
Start: 2018-01-03 — End: 2018-06-09

## 2018-01-03 NOTE — Nursing Note (Signed)
01/03/18 1000   B12 Injection flowsheet   Medication Vitamin B12   Dose (mcg) 1,000 mcg   Site Right Gluteus   Route IM   Lot # [id number]   Expiration date 08/19/19   Manufacturer app pharmaceuticals   NDC # [phone]) [id number]   Initials vd   Who supplied B12 Clinic supplied

## 2018-01-03 NOTE — Progress Notes (Signed)
[facility]  [address] [place] [address]  Phone: [phone]    Encounter Date: 01/03/2018    Patient ID:  Elizabeth Page  [id number]    DOB: 1946/08/16  Age: 72 y.o. female    Subjective:     Chief Complaint   Patient presents with   . Follow Up     2 month follow up     here for a follow up.  was at [place] [facility] with complaints of generalized fatigue.  continues to be depressed.  did not go to her psych appt.      The history is provided by the patient.     Current Outpatient Medications   Medication Sig   . ACCU-CHEK AVIVA CONTROL SOLN Solution    . ACCU-CHEK SOFTCLIX LANCETS Misc    . Amlodipine-Olmesartan 5-20 mg Oral Tablet Take 1 Tab by mouth Once a day   . B COMPLEX-VITAMIN B12 Oral Tablet    . Calcium Carbonate-Vitamin D2 (OYSTER SHELL CALCIUM-VIT D2) 500 mg(1,250mg ) -200 unit Oral Tablet one a week   . clonazePAM (KLONOPIN) 0.5 mg Oral Tablet Take 1 Tab (0.5 mg total) by mouth Twice per day as needed   . cyanocobalamin (VITAMIN B12) 1,000 mcg/mL Injection Solution    . cyanocobalamin (VITAMIN B12) 1,000 mcg/mL Injection Solution 1 mL (1,000 mcg total) by Subcutaneous route Once a day   . DAILY-VITE Oral Tablet    . DULoxetine (CYMBALTA DR) 60 mg Oral Capsule, Delayed Release(E.C.) Take 1 Cap (60 mg total) by mouth Once a day   . famotidine (PEPCID) 20 mg Oral Tablet    . ferrous sulfate (IRON) 325 mg (65 mg iron) Oral Tablet Take 1 Tab (325 mg total) by mouth Once a day   . Food Supplement, Lactose-Free (ENSURE ACTIVE HIGH PROTEIN) Oral Liquid Use daily to bid prn   . levothyroxine (SYNTHROID) 50 mcg Oral Tablet Take 1 Tab (50 mcg total) by mouth Every morning for 30 days   . metformin (GLUCOPHAGE-XR) 750 mg Oral Tablet Sustained Release 24 hr Take 1 Tab (750 mg total) by mouth Twice daily   . nystatin (MYCOSTATIN) 100,000 unit/mL Oral Suspension swish and swallow 5 ml tid   . OLANZapine (ZYPREXA) 2.5 mg Oral Tablet Take 1 Tab (2.5 mg total) by mouth Every night   . pantoprazole (PROTONIX) 40  mg Oral Tablet, Delayed Release (E.C.) Take 1 Tab (40 mg total) by mouth Once a day   . sucralfate (CARAFATE) 1 gram Oral Tablet Take 1 Tab (1 g total) by mouth Three times daily before meals   . VITAMIN D 50,000 unit Oral Capsule TAKE ONE CAPSULE BY MOUTH ONCE WEEKLY     Allergies   Allergen Reactions   . Allergy-Decongestant [Dexbrompheniramine-Pseudoephed]    . Codeine    . Cortisone    . Motrin [Ibuprofen]      Past Medical History:   Diagnosis Date   . Anxiety    . Depression    . Diabetes mellitus, type 2 (CMS HCC)    . GERD (gastroesophageal reflux disease)    . History of concussion    . HTN (hypertension)    . Hyperlipidemia    . Stress    . Ulcerative colitis (CMS HCC)          Past Surgical History:   Procedure Laterality Date   . HX CESAREAN SECTION     . HX HYSTERECTOMY     . ORIF ANKLE FRACTURE  Family Medical History:     Problem Relation (Age of Onset)    Other Mother, Father            Social History     Tobacco Use   . Smoking status: Never Smoker   . Smokeless tobacco: Never Used   Substance Use Topics   . Alcohol use: No   . Drug use: Not on file       Review of Systems   Constitutional: Positive for fatigue.   HENT: Negative.    Eyes: Negative.    Respiratory: Negative.    Cardiovascular: Negative.    Gastrointestinal: Negative.    Endocrine: Negative.    Genitourinary: Negative.    Musculoskeletal: Negative.    Skin: Negative.    Allergic/Immunologic: Negative.    Neurological: Negative.    Hematological: Negative.    Psychiatric/Behavioral: Positive for sleep disturbance. Negative for self-injury and suicidal ideas. The patient is nervous/anxious.      Objective:   Vitals: BP 130/64   Pulse (!) 109   Temp 36.4 C (97.6 F) (Tympanic)   Ht 1.575 m (5\' 2" )   Wt 73.9 kg (163 lb)   SpO2 98%   BMI 29.81 kg/m         Physical Exam   Constitutional: She is oriented to person, place, and time. She appears well-developed and well-nourished.   HENT:   Head: Normocephalic and atraumatic.     Eyes: Pupils are equal, round, and reactive to light. Conjunctivae and EOM are normal.   Neck: Normal range of motion. Neck supple.   Cardiovascular: Normal rate, regular rhythm, normal heart sounds and intact distal pulses. Exam reveals no gallop and no friction rub.   No murmur heard.  Pulmonary/Chest: Effort normal and breath sounds normal.   Abdominal: Soft. Bowel sounds are normal.   Musculoskeletal: Normal range of motion.   Neurological: She is alert and oriented to person, place, and time. She has normal reflexes.   Skin: Skin is warm and dry.   Psychiatric: She has a normal mood and affect. Her behavior is normal. Judgment and thought content normal.   Nursing note and vitals reviewed.      Assessment & Plan:     ENCOUNTER DIAGNOSES     ICD-10-CM   1. B12 deficiency E53.8   2. Type 2 diabetes mellitus without complication, without long-term current use of insulin (CMS HCC) E11.9   3. GERD without esophagitis K21.9   4. Anxiety F41.9   5. Mood disorder (CMS HCC) F39   6. Essential hypertension I10       Orders Placed This Encounter   . CANCELED: Administer Injection (IM/SQ)   . Administer Injection (IM/SQ)   . ferrous sulfate (IRON) 325 mg (65 mg iron) Oral Tablet   . cyanocobalamin (VITAMIN B12) 1,000 mcg/mL Injection Solution   REC to see psych.  patient defers at this time.    Return in about 3 months (around 04/05/2018).    Farrel GordonKatie J Inclan, PA-C

## 2018-01-17 ENCOUNTER — Ambulatory Visit: Payer: Commercial Managed Care - PPO | Attending: PHYSICIAN ASSISTANT

## 2018-01-17 DIAGNOSIS — E538 Deficiency of other specified B group vitamins: Secondary | ICD-10-CM | POA: Insufficient documentation

## 2018-01-17 NOTE — Nursing Note (Signed)
01/17/18 1400   B12 Injection flowsheet   Medication Vitamin B12   Dose (mcg) 1,000 mcg   Site Right Gluteus   Route IM   Lot # [id number]   Expiration date 08/19/19   Manufacturer app [employer]   NDC # [id number]   Initials vd   Who supplied B12 Clinic supplied

## 2018-01-24 ENCOUNTER — Other Ambulatory Visit (HOSPITAL_BASED_OUTPATIENT_CLINIC_OR_DEPARTMENT_OTHER): Payer: Self-pay | Admitting: PHYSICIAN ASSISTANT

## 2018-01-24 DIAGNOSIS — E039 Hypothyroidism, unspecified: Secondary | ICD-10-CM

## 2018-01-24 MED ORDER — NYSTATIN 100,000 UNIT/ML ORAL SUSPENSION
ORAL | 0 refills | Status: DC
Start: 2018-01-24 — End: 2019-01-18

## 2018-01-24 MED ORDER — LEVOTHYROXINE 50 MCG TABLET
50.00 ug | ORAL_TABLET | Freq: Every morning | ORAL | 5 refills | Status: DC
Start: 2018-01-24 — End: 2018-10-16

## 2018-01-24 NOTE — Telephone Encounter (Signed)
Patient called with complaints of having a salt taste in her mouth, everything that she puts in mouth taste like is is pure salt, wanting to know what she should do.  She was unable to sleep last night.  She tries to drink water and it taste like salt water

## 2018-01-30 ENCOUNTER — Other Ambulatory Visit (HOSPITAL_BASED_OUTPATIENT_CLINIC_OR_DEPARTMENT_OTHER): Payer: Self-pay | Admitting: PHYSICIAN ASSISTANT

## 2018-01-30 DIAGNOSIS — E119 Type 2 diabetes mellitus without complications: Secondary | ICD-10-CM

## 2018-01-30 MED ORDER — METFORMIN ER 750 MG TABLET,EXTENDED RELEASE 24 HR
750.00 mg | ORAL_TABLET | Freq: Two times a day (BID) | ORAL | 5 refills | Status: DC
Start: 2018-01-30 — End: 2018-06-12

## 2018-02-14 ENCOUNTER — Ambulatory Visit: Payer: Commercial Managed Care - PPO | Attending: PHYSICIAN ASSISTANT

## 2018-02-14 DIAGNOSIS — E538 Deficiency of other specified B group vitamins: Secondary | ICD-10-CM | POA: Insufficient documentation

## 2018-02-14 NOTE — Nursing Note (Signed)
02/14/18 1200   B12 Injection flowsheet   Medication Vitamin B12   Dose (mcg) 1,000 mcg   Site Left Gluteus   Route IM   Lot # [id number]   Expiration date 07/19/19   Manufacturer [employer]   NDC # 09811-914-78   Initials vd   Who supplied B12 Clinic supplied

## 2018-02-15 ENCOUNTER — Other Ambulatory Visit (HOSPITAL_BASED_OUTPATIENT_CLINIC_OR_DEPARTMENT_OTHER): Payer: Self-pay | Admitting: PHYSICIAN ASSISTANT

## 2018-02-16 MED ORDER — NYSTATIN 100,000 UNIT/GRAM TOPICAL CREAM
TOPICAL_CREAM | Freq: Two times a day (BID) | CUTANEOUS | 0 refills | Status: DC
Start: 2018-02-16 — End: 2019-01-18

## 2018-02-21 ENCOUNTER — Other Ambulatory Visit (HOSPITAL_BASED_OUTPATIENT_CLINIC_OR_DEPARTMENT_OTHER): Payer: Self-pay | Admitting: PHYSICIAN ASSISTANT

## 2018-02-21 MED ORDER — CLONAZEPAM 0.5 MG TABLET
0.50 mg | ORAL_TABLET | Freq: Two times a day (BID) | ORAL | 1 refills | Status: DC | PRN
Start: 2018-02-21 — End: 2018-03-21

## 2018-03-21 ENCOUNTER — Other Ambulatory Visit (HOSPITAL_BASED_OUTPATIENT_CLINIC_OR_DEPARTMENT_OTHER): Payer: Self-pay | Admitting: PHYSICIAN ASSISTANT

## 2018-03-21 MED ORDER — CLONAZEPAM 0.5 MG TABLET
0.50 mg | ORAL_TABLET | Freq: Two times a day (BID) | ORAL | 1 refills | Status: DC | PRN
Start: 2018-03-21 — End: 2018-05-09

## 2018-04-07 ENCOUNTER — Encounter (HOSPITAL_BASED_OUTPATIENT_CLINIC_OR_DEPARTMENT_OTHER): Payer: Self-pay | Admitting: PHYSICIAN ASSISTANT

## 2018-04-07 ENCOUNTER — Ambulatory Visit: Payer: Commercial Managed Care - PPO | Attending: PHYSICIAN ASSISTANT | Admitting: PHYSICIAN ASSISTANT

## 2018-04-07 VITALS — BP 126/74 | HR 94 | Temp 97.6°F | Ht 62.0 in | Wt 168.0 lb

## 2018-04-07 DIAGNOSIS — Z7984 Long term (current) use of oral hypoglycemic drugs: Secondary | ICD-10-CM

## 2018-04-07 DIAGNOSIS — Z888 Allergy status to other drugs, medicaments and biological substances status: Secondary | ICD-10-CM | POA: Insufficient documentation

## 2018-04-07 DIAGNOSIS — Z79899 Other long term (current) drug therapy: Secondary | ICD-10-CM | POA: Insufficient documentation

## 2018-04-07 DIAGNOSIS — R5383 Other fatigue: Secondary | ICD-10-CM

## 2018-04-07 DIAGNOSIS — I10 Essential (primary) hypertension: Principal | ICD-10-CM | POA: Insufficient documentation

## 2018-04-07 DIAGNOSIS — Z885 Allergy status to narcotic agent status: Secondary | ICD-10-CM | POA: Insufficient documentation

## 2018-04-07 DIAGNOSIS — F39 Unspecified mood [affective] disorder: Secondary | ICD-10-CM | POA: Insufficient documentation

## 2018-04-07 DIAGNOSIS — R300 Dysuria: Secondary | ICD-10-CM | POA: Insufficient documentation

## 2018-04-07 DIAGNOSIS — E785 Hyperlipidemia, unspecified: Secondary | ICD-10-CM | POA: Insufficient documentation

## 2018-04-07 DIAGNOSIS — E119 Type 2 diabetes mellitus without complications: Secondary | ICD-10-CM | POA: Insufficient documentation

## 2018-04-07 DIAGNOSIS — F419 Anxiety disorder, unspecified: Secondary | ICD-10-CM | POA: Insufficient documentation

## 2018-04-07 DIAGNOSIS — K219 Gastro-esophageal reflux disease without esophagitis: Secondary | ICD-10-CM | POA: Insufficient documentation

## 2018-04-11 ENCOUNTER — Encounter (HOSPITAL_BASED_OUTPATIENT_CLINIC_OR_DEPARTMENT_OTHER): Payer: Self-pay | Admitting: PHYSICIAN ASSISTANT

## 2018-04-11 NOTE — Progress Notes (Signed)
[facility]  [address]  [place] [address]  Phone: [phone]    Encounter Date: 04/07/2018    Patient ID:  Moises BloodSusan Livers  QQV:[id number]    DOB: May 28, 1946  Age: 72 y.o. female    Subjective:     Chief Complaint   Patient presents with   . Follow Up     3 month follow up     here for 3 month follow up.  c/o continued anxiety and depression.  feels fatigued.    The history is provided by the patient.     Current Outpatient Medications   Medication Sig   . ACCU-CHEK AVIVA CONTROL SOLN Solution    . ACCU-CHEK SOFTCLIX LANCETS Misc    . Amlodipine-Olmesartan 5-20 mg Oral Tablet Take 1 Tab by mouth Once a day   . B COMPLEX-VITAMIN B12 Oral Tablet    . Calcium Carbonate-Vitamin D2 (OYSTER SHELL CALCIUM-VIT D2) 500 mg(1,250mg ) -200 unit Oral Tablet one a week   . clonazePAM (KLONOPIN) 0.5 mg Oral Tablet Take 1 Tab (0.5 mg total) by mouth Twice per day as needed   . cyanocobalamin (VITAMIN B12) 1,000 mcg/mL Injection Solution    . cyanocobalamin (VITAMIN B12) 1,000 mcg/mL Injection Solution 1 mL (1,000 mcg total) by Subcutaneous route Once a day   . DAILY-VITE Oral Tablet    . DULoxetine (CYMBALTA DR) 60 mg Oral Capsule, Delayed Release(E.C.) Take 1 Cap (60 mg total) by mouth Once a day   . famotidine (PEPCID) 20 mg Oral Tablet    . ferrous sulfate (IRON) 325 mg (65 mg iron) Oral Tablet Take 1 Tab (325 mg total) by mouth Once a day   . Food Supplement, Lactose-Free (ENSURE ACTIVE HIGH PROTEIN) Oral Liquid Use daily to bid prn   . levothyroxine (SYNTHROID) 50 mcg Oral Tablet Take 1 Tab (50 mcg total) by mouth Every morning for 30 days   . metformin (GLUCOPHAGE-XR) 750 mg Oral Tablet Sustained Release 24 hr Take 1 Tab (750 mg total) by mouth Twice daily   . nystatin (MYCOSTATIN) 100,000 unit/gram Cream by Apply Topically route Twice daily   . nystatin (MYCOSTATIN) 100,000 unit/mL Oral Suspension swish and swallow 5 ml tid   . OLANZapine (ZYPREXA) 2.5 mg Oral Tablet Take 1 Tab (2.5 mg total) by mouth Every night      . pantoprazole (PROTONIX) 40 mg Oral Tablet, Delayed Release (E.C.) Take 1 Tab (40 mg total) by mouth Once a day   . sucralfate (CARAFATE) 1 gram Oral Tablet Take 1 Tab (1 g total) by mouth Three times daily before meals   . VITAMIN D 50,000 unit Oral Capsule TAKE ONE CAPSULE BY MOUTH ONCE WEEKLY     Allergies   Allergen Reactions   . Allergy-Decongestant [Dexbrompheniramine-Pseudoephed]    . Codeine    . Cortisone    . Motrin [Ibuprofen]      Past Medical History:   Diagnosis Date   . Anxiety    . Depression    . Diabetes mellitus, type 2 (CMS HCC)    . GERD (gastroesophageal reflux disease)    . History of concussion    . HTN (hypertension)    . Hyperlipidemia    . Stress    . Ulcerative colitis (CMS HCC)          Past Surgical History:   Procedure Laterality Date   . HX CESAREAN SECTION     . HX HYSTERECTOMY     . ORIF ANKLE FRACTURE  Family Medical History:     Problem Relation (Age of Onset)    Other Mother, Father            Social History     Tobacco Use   . Smoking status: Never Smoker   . Smokeless tobacco: Never Used   Substance Use Topics   . Alcohol use: No   . Drug use: Not on file       Review of Systems   Constitutional: Positive for fatigue.   HENT: Negative.    Eyes: Negative.    Respiratory: Negative.    Cardiovascular: Negative.    Gastrointestinal: Negative.    Endocrine: Negative.    Genitourinary: Positive for dysuria.   Musculoskeletal: Positive for arthralgias.   Skin: Negative.    Allergic/Immunologic: Negative.    Neurological: Negative.    Hematological: Negative.    Psychiatric/Behavioral: Positive for sleep disturbance. Negative for self-injury and suicidal ideas. The patient is nervous/anxious.      Objective:   Vitals: BP 126/74   Pulse 94   Temp 36.4 C (97.6 F) (Tympanic)   Ht 1.575 m (5\' 2" )   Wt 76.2 kg (168 lb)   SpO2 98%   BMI 30.73 kg/m         Physical Exam   Constitutional: She is oriented to person, place, and time. She appears well-developed and  well-nourished.   HENT:   Head: Normocephalic and atraumatic.   Eyes: Pupils are equal, round, and reactive to light. Conjunctivae and EOM are normal.   Neck: Normal range of motion. Neck supple.   Cardiovascular: Normal rate, regular rhythm, normal heart sounds and intact distal pulses. Exam reveals no gallop and no friction rub.   No murmur heard.  Pulmonary/Chest: Effort normal and breath sounds normal.   Abdominal: Soft. Bowel sounds are normal.   Musculoskeletal: Normal range of motion.   Neurological: She is alert and oriented to person, place, and time. She has normal reflexes.   Skin: Skin is warm and dry.   Psychiatric: She has a normal mood and affect. Her behavior is normal. Judgment and thought content normal.   Nursing note and vitals reviewed.      Assessment & Plan:     ENCOUNTER DIAGNOSES     ICD-10-CM   1. Essential hypertension I10   2. Mood disorder (CMS HCC) F39   3. Type 2 diabetes mellitus without complication, without long-term current use of insulin (CMS HCC) E11.9   4. GERD without esophagitis K21.9   5. Anxiety F41.9   6. Dysuria R30.0       Orders Placed This Encounter   . CBC/DIFF   . Comp Metabolic Panel-Fasting   . TSH w/ Free T4 Reflex   . Hemoglobin A1C   . Urinalysis, Macroscopic and Microscopic w/Culture Reflex     rec psych.  she defers at this time.  Return in about 3 months (around 07/08/2018), or if symptoms worsen or fail to improve.    [name], PA-C

## 2018-05-09 ENCOUNTER — Other Ambulatory Visit (HOSPITAL_BASED_OUTPATIENT_CLINIC_OR_DEPARTMENT_OTHER): Payer: Self-pay | Admitting: PHYSICIAN ASSISTANT

## 2018-05-09 MED ORDER — CLONAZEPAM 0.5 MG TABLET
0.50 mg | ORAL_TABLET | Freq: Two times a day (BID) | ORAL | 1 refills | Status: DC | PRN
Start: 2018-05-09 — End: 2018-07-19

## 2018-05-09 MED ORDER — DULOXETINE 60 MG CAPSULE,DELAYED RELEASE
60.0000 mg | DELAYED_RELEASE_CAPSULE | Freq: Every day | ORAL | 5 refills | Status: DC
Start: 2018-05-09 — End: 2018-11-13

## 2018-05-17 ENCOUNTER — Other Ambulatory Visit (HOSPITAL_BASED_OUTPATIENT_CLINIC_OR_DEPARTMENT_OTHER): Payer: Self-pay | Admitting: PHYSICIAN ASSISTANT

## 2018-06-09 ENCOUNTER — Other Ambulatory Visit: Payer: Self-pay

## 2018-06-09 ENCOUNTER — Emergency Department
Admission: EM | Admit: 2018-06-09 | Discharge: 2018-06-09 | Disposition: A | Discharge status: Home / Self Care | Payer: Commercial Managed Care - PPO | Attending: GENERAL PRACTICE | Admitting: GENERAL PRACTICE

## 2018-06-09 ENCOUNTER — Emergency Department (HOSPITAL_COMMUNITY): Payer: Commercial Managed Care - PPO

## 2018-06-09 ENCOUNTER — Encounter (HOSPITAL_COMMUNITY): Payer: Self-pay

## 2018-06-09 DIAGNOSIS — Z885 Allergy status to narcotic agent status: Secondary | ICD-10-CM | POA: Insufficient documentation

## 2018-06-09 DIAGNOSIS — Z888 Allergy status to other drugs, medicaments and biological substances status: Secondary | ICD-10-CM | POA: Insufficient documentation

## 2018-06-09 DIAGNOSIS — G4700 Insomnia, unspecified: Secondary | ICD-10-CM | POA: Insufficient documentation

## 2018-06-09 DIAGNOSIS — F419 Anxiety disorder, unspecified: Secondary | ICD-10-CM | POA: Insufficient documentation

## 2018-06-09 DIAGNOSIS — E785 Hyperlipidemia, unspecified: Secondary | ICD-10-CM | POA: Insufficient documentation

## 2018-06-09 DIAGNOSIS — N390 Urinary tract infection, site not specified: Secondary | ICD-10-CM | POA: Insufficient documentation

## 2018-06-09 DIAGNOSIS — E119 Type 2 diabetes mellitus without complications: Secondary | ICD-10-CM | POA: Insufficient documentation

## 2018-06-09 DIAGNOSIS — R531 Weakness: Secondary | ICD-10-CM | POA: Insufficient documentation

## 2018-06-09 DIAGNOSIS — Z886 Allergy status to analgesic agent status: Secondary | ICD-10-CM | POA: Insufficient documentation

## 2018-06-09 DIAGNOSIS — I10 Essential (primary) hypertension: Secondary | ICD-10-CM | POA: Insufficient documentation

## 2018-06-09 LAB — CBC WITH DIFF
HCT: 33.9 % — ABNORMAL LOW (ref 37.0–47.0)
HGB: 11.1 g/dL — ABNORMAL LOW (ref 12.5–16.0)
MCH: 24.2 pg — ABNORMAL LOW (ref 28.0–33.0)
MCHC: 32.8 g/dL (ref 32.0–37.0)
MCV: 73.6 fL — ABNORMAL LOW (ref 78.0–100.0)
PLATELETS: 288 10*3/uL (ref 130–400)
RBC: 4.6 x10ˆ6/uL (ref 4.20–5.40)
RDW: 15.2 % (ref 9.9–16.5)
WBC: 5.9 x10ˆ3/uL (ref 4.5–11.0)

## 2018-06-09 LAB — URINALYSIS, MICROSCOPIC

## 2018-06-09 LAB — URINALYSIS, MACROSCOPIC
BILIRUBIN: NEGATIVE mg/dL
BLOOD: NEGATIVE mg/dL
GLUCOSE: NEGATIVE mg/dL
KETONES: NEGATIVE mg/dL
NITRITE: NEGATIVE
PH: 6 (ref 4.5–8.0)
PROTEIN: NEGATIVE mg/dL
SPECIFIC GRAVITY: <=1.005 (ref 1.001–1.035)
UROBILINOGEN: 0.2 mg/dL (ref 0.2–1.0)

## 2018-06-09 LAB — MANUAL DIFFERENTIAL
EOSINOPHIL %: 1 % (ref 0–4)
EOSINOPHIL ABSOLUTE: 0.06 x10ˆ3/uL (ref 0.00–0.50)
LYMPHOCYTE %: 38 % — ABNORMAL HIGH (ref 23–35)
LYMPHOCYTE ABSOLUTE: 2.24 x10ˆ3/uL (ref 0.90–2.90)
MONOCYTE %: 9 % (ref 0–12)
MONOCYTE ABSOLUTE: 0.53 10*3/uL (ref 0.30–0.90)
NEUTROPHIL %: 52 % (ref 50–70)
NEUTROPHIL ABSOLUTE: 3.07 x10ˆ3/uL (ref 1.70–7.00)
WBC MORPHOLOGY COMMENT: NORMAL
WBC: 5.9 x10ˆ3/uL

## 2018-06-09 LAB — BASIC METABOLIC PANEL
ANION GAP: 12 mmol/L (ref 4–13)
BUN: 27 mg/dL — ABNORMAL HIGH (ref 8–20)
CALCIUM: 9.5 mg/dL (ref 8.9–10.3)
CHLORIDE: 98 mmol/L — ABNORMAL LOW (ref 101–111)
CO2 TOTAL: 23 mmol/L (ref 22–32)
CREATININE: 0.93 mg/dL (ref 0.40–1.00)
ESTIMATED GFR: 59 mL/min/1.73mˆ2 — ABNORMAL LOW (ref >60)
GLUCOSE: 108 mg/dL — ABNORMAL HIGH (ref 70–99)
POTASSIUM: 4.6 mmol/L (ref 3.6–5.1)
SODIUM: 133 mmol/L — ABNORMAL LOW (ref 135–145)

## 2018-06-09 MED ORDER — SULFAMETHOXAZOLE 800 MG-TRIMETHOPRIM 160 MG TABLET
1.00 | ORAL_TABLET | Freq: Two times a day (BID) | ORAL | 0 refills | Status: DC
Start: 2018-06-09 — End: 2018-06-12

## 2018-06-09 NOTE — ED Nurses Note (Signed)
iv removed , dressing applied

## 2018-06-09 NOTE — ED Triage Notes (Signed)
Pt states she has not been eating right because she is allergic to everything so she has been losing muscle and is now weak for a week. Pt states she is supposed to get physical therapy but does not have transportation.

## 2018-06-09 NOTE — ED Nurses Note (Signed)
Discharge instructions and prescription for Bactrim DS reviewed with patient. Patient verbalized understanding.Offered opportunity for patient to ask questions; answered all of patient's questions. Patient ambulated out of department; gait strong and steady.

## 2018-06-10 ENCOUNTER — Telehealth (HOSPITAL_COMMUNITY): Payer: Self-pay | Admitting: PHYSICIAN ASSISTANT

## 2018-06-10 NOTE — Telephone Encounter (Signed)
Chart reviewed prior to patient contact.  Follow-up call placed to patient in regards to ED discharge on 06/09/18.  Discharge instructions reviewed with patient including diagnosis, medications, follow-up appointments, future appointments, and patient education. Patient stated she was unable to get Rx filled as she has no transportation and won't be able to get it until Monday.  Offered SW referral for assistance with information about resources available to her in her area.  She agreed.  Instructed pt to drink plenty of fluids, water specifically.  Patient provided [facility] direct line phone number and educated on services provided. Pt verbalized understanding of all. Following interventions were completed:  Referral to Social Services.  Marolyn HallerJessie Crecencio Kwiatek, RN  06/10/2018, 16:37

## 2018-06-12 ENCOUNTER — Other Ambulatory Visit (HOSPITAL_BASED_OUTPATIENT_CLINIC_OR_DEPARTMENT_OTHER): Payer: Self-pay | Admitting: PHYSICIAN ASSISTANT

## 2018-06-12 ENCOUNTER — Telehealth (HOSPITAL_BASED_OUTPATIENT_CLINIC_OR_DEPARTMENT_OTHER): Payer: Self-pay | Admitting: PHYSICIAN ASSISTANT

## 2018-06-12 DIAGNOSIS — E119 Type 2 diabetes mellitus without complications: Secondary | ICD-10-CM

## 2018-06-12 MED ORDER — METFORMIN ER 750 MG TABLET,EXTENDED RELEASE 24 HR
750.00 mg | ORAL_TABLET | Freq: Two times a day (BID) | ORAL | 5 refills | Status: DC
Start: 2018-06-12 — End: 2019-01-17

## 2018-06-12 MED ORDER — NITROFURANTOIN MONOHYDRATE/MACROCRYSTALS 100 MG CAPSULE: 100 mg | Cap | Freq: Two times a day (BID) | ORAL | 0 refills | 0 days | Status: AC

## 2018-06-12 MED ORDER — AMLODIPINE 5 MG-OLMESARTAN 20 MG TABLET
1.0000 | ORAL_TABLET | Freq: Every day | ORAL | 3 refills | Status: DC
Start: 2018-06-12 — End: 2019-06-20

## 2018-06-12 NOTE — Telephone Encounter (Signed)
I sent macrobid.  Farrel GordonKatie J Julisa Flippo, PA-C  06/12/2018, 11:02

## 2018-06-12 NOTE — Telephone Encounter (Signed)
pt was prescribed Bactrim DS at ER for UTI, does not want to take due to side effects that she read about.  wants to know if there is something else that you prescribe?

## 2018-06-14 NOTE — ED Provider Notes (Signed)
Patient is a 72 year old white female who presents to the emergency department with a ongoing issue of generalized weakness and decreased appetite for greater than 1 year.  Patient has been followed by her primary care physician for this with physical therapy ordered but is unable to obtain transportation to the physical therapy facility.  Today patient was out to get groceries and relates increased fatigue after grocery shopping.  No history of fever chills nausea vomiting diarrhea or other somatic complaints except for insomnia increased stressors due to recent move and "worry about my Page sugar".  Elizabeth BloodSusan Page  1946/02/02  72 y.o.  female    Chief Complaint:   Chief Complaint   Patient presents with   . Weakness       HPI: This is a 72 y.o. female who presents to the emergency department          Past Medical History:   Past Medical History:   Diagnosis Date   . Anxiety    . Depression    . Diabetes mellitus, type 2 (CMS HCC)    . GERD (gastroesophageal reflux disease)    . History of concussion    . HTN (hypertension)    . Hyperlipidemia    . Stress    . Ulcerative colitis (CMS HCC)        Past Surgical History:   Past Surgical History:   Procedure Laterality Date   . Hx cesarean section     . Hx hysterectomy     . Orif ankle fracture     . Total abdominal hysterectomy         Social History:   Social History     Tobacco Use   . Smoking status: Never Smoker   . Smokeless tobacco: Never Used   Substance Use Topics   . Alcohol use: No   . Drug use: Never      Social History     Substance and Sexual Activity   Drug Use Never       Allergies:   Allergies   Allergen Reactions   . Allergy-Decongestant [Dexbrompheniramine-Pseudoephed]    . Codeine    . Cortisone    . Motrin [Ibuprofen]          Review of Systems: 10 systems reviewed and negative except as noted in HPI.      Physical Exam    General:  Patient alert and oriented x4.  No acute distress.  BP (!) 144/99   Pulse 100   Temp 36.8 C (98.3 F)   Resp 18    Ht 1.575 m (5\' 2" )   Wt 73.5 kg (162 lb)   SpO2 98%   BMI 29.63 kg/m         HEENT:  Head:  Normocephalic, atraumatic.  Eyes: Normal conjunctiva. Oropharynx: Oral mucosa is moist.     Neck: Supple.  No lymphadenopathy.    Respiratory:  Chest clear to auscultation bilaterally. No rales, rhonchi, or wheezes.  No respiratory distress.     Cardiovascular:  Heart regular rate and rhythm without murmurs, rubs, or gallops.      Abdomen: Soft, non-tender, bowel sounds positive.      Extremities:  Normal pulses. No clubbing, cyanosis, or edema.    MS:  No ROM deficit.  Normal strength.     Neuro: Alert and oriented x4.  No focal motor or sensory deficits.      Skin:  Warm and dry. No rashes or lesions.  Medical Decision Making:   Patient was triaged, vital signs were obtained, patient was  placed in a room.  I did examine the patient.  After examining the patient and observation in the emergency department patient remained asymptomatic and was discharged in satisfactory condition with the plan to follow up with her primary care.    ergency department   This note was partially created using voice recognition software and is inherently subject to errors including those of syntax and "sound alike " substitutions which may escape proof reading.  In such instances, original meaning may be extrapolated by contextual derivation.         Clinical Impression:   Encounter Diagnoses   Name Primary?   . Generalized weakness Yes   . Urinary tract infection            Disposition: Discharged             Procedures

## 2018-07-19 ENCOUNTER — Other Ambulatory Visit (HOSPITAL_BASED_OUTPATIENT_CLINIC_OR_DEPARTMENT_OTHER): Payer: Self-pay | Admitting: PHYSICIAN ASSISTANT

## 2018-07-19 MED ORDER — CLONAZEPAM 0.5 MG TABLET
0.50 mg | ORAL_TABLET | Freq: Two times a day (BID) | ORAL | 1 refills | Status: DC | PRN
Start: 2018-07-19 — End: 2018-08-04

## 2018-08-04 ENCOUNTER — Ambulatory Visit: Payer: Commercial Managed Care - PPO | Attending: PHYSICIAN ASSISTANT | Admitting: PHYSICIAN ASSISTANT

## 2018-08-04 ENCOUNTER — Encounter (HOSPITAL_BASED_OUTPATIENT_CLINIC_OR_DEPARTMENT_OTHER): Payer: Self-pay | Admitting: PHYSICIAN ASSISTANT

## 2018-08-04 VITALS — BP 128/66 | HR 101 | Temp 97.6°F | Ht 62.0 in | Wt 172.0 lb

## 2018-08-04 DIAGNOSIS — K219 Gastro-esophageal reflux disease without esophagitis: Secondary | ICD-10-CM | POA: Insufficient documentation

## 2018-08-04 DIAGNOSIS — Z886 Allergy status to analgesic agent status: Secondary | ICD-10-CM | POA: Insufficient documentation

## 2018-08-04 DIAGNOSIS — M4802 Spinal stenosis, cervical region: Secondary | ICD-10-CM | POA: Insufficient documentation

## 2018-08-04 DIAGNOSIS — F419 Anxiety disorder, unspecified: Secondary | ICD-10-CM | POA: Insufficient documentation

## 2018-08-04 DIAGNOSIS — Z885 Allergy status to narcotic agent status: Secondary | ICD-10-CM | POA: Insufficient documentation

## 2018-08-04 DIAGNOSIS — Z7984 Long term (current) use of oral hypoglycemic drugs: Secondary | ICD-10-CM | POA: Insufficient documentation

## 2018-08-04 DIAGNOSIS — I10 Essential (primary) hypertension: Principal | ICD-10-CM | POA: Insufficient documentation

## 2018-08-04 DIAGNOSIS — E119 Type 2 diabetes mellitus without complications: Secondary | ICD-10-CM | POA: Insufficient documentation

## 2018-08-04 DIAGNOSIS — Z888 Allergy status to other drugs, medicaments and biological substances status: Secondary | ICD-10-CM | POA: Insufficient documentation

## 2018-08-04 DIAGNOSIS — E785 Hyperlipidemia, unspecified: Secondary | ICD-10-CM | POA: Insufficient documentation

## 2018-08-04 DIAGNOSIS — Z79899 Other long term (current) drug therapy: Secondary | ICD-10-CM | POA: Insufficient documentation

## 2018-08-04 DIAGNOSIS — F329 Major depressive disorder, single episode, unspecified: Secondary | ICD-10-CM | POA: Insufficient documentation

## 2018-08-04 DIAGNOSIS — L84 Corns and callosities: Secondary | ICD-10-CM

## 2018-08-04 DIAGNOSIS — F39 Unspecified mood [affective] disorder: Secondary | ICD-10-CM | POA: Insufficient documentation

## 2018-08-04 MED ORDER — CLONAZEPAM 0.5 MG TABLET
0.50 mg | ORAL_TABLET | Freq: Three times a day (TID) | ORAL | 2 refills | Status: DC | PRN
Start: 2018-08-04 — End: 2018-11-15

## 2018-08-05 ENCOUNTER — Encounter (HOSPITAL_BASED_OUTPATIENT_CLINIC_OR_DEPARTMENT_OTHER): Payer: Self-pay | Admitting: PHYSICIAN ASSISTANT

## 2018-08-05 NOTE — Progress Notes (Addendum)
[facility]  [address]  [place] [address]  Phone: [phone]    Encounter Date: 08/04/2018    Patient ID:  Elizabeth Page  ACZ:[id number]    DOB: [birth year]  Age: 72 y.o. female    Subjective:     Chief Complaint   Patient presents with   . Follow Up     3 mo f/u    . Diabetes     diabetic shoe eval      The history is provided by the patient.     Current Outpatient Medications   Medication Sig   . ACCU-CHEK AVIVA CONTROL SOLN Solution    . ACCU-CHEK SOFTCLIX LANCETS Misc    . Amlodipine-Olmesartan 5-20 mg Oral Tablet Take 1 Tab by mouth Once a day   . B COMPLEX-VITAMIN B12 Oral Tablet    . clonazePAM (KLONOPIN) 0.5 mg Oral Tablet Take 1 Tab (0.5 mg total) by mouth Three times a day as needed   . DULoxetine (CYMBALTA DR) 60 mg Oral Capsule, Delayed Release(E.C.) Take 1 Cap (60 mg total) by mouth Once a day   . levothyroxine (SYNTHROID) 50 mcg Oral Tablet Take 1 Tab (50 mcg total) by mouth Every morning for 30 days   . metformin (GLUCOPHAGE-XR) 750 mg Oral Tablet Sustained Release 24 hr Take 1 Tab (750 mg total) by mouth Twice daily   . nystatin (MYCOSTATIN) 100,000 unit/gram Cream by Apply Topically route Twice daily   . nystatin (MYCOSTATIN) 100,000 unit/mL Oral Suspension swish and swallow 5 ml tid   . pantoprazole (PROTONIX) 40 mg Oral Tablet, Delayed Release (E.C.) Take 1 Tab (40 mg total) by mouth Once a day   . VITAMIN D 50,000 unit Oral Capsule TAKE ONE CAPSULE BY MOUTH ONCE WEEKLY     Allergies   Allergen Reactions   . Allergy-Decongestant [Dexbrompheniramine-Pseudoephed]    . Codeine    . Cortisone    . Motrin [Ibuprofen]      Past Medical History:   Diagnosis Date   . Anxiety    . Depression    . Diabetes mellitus, type 2 (CMS HCC)    . GERD (gastroesophageal reflux disease)    . History of concussion    . HTN (hypertension)    . Hyperlipidemia    . Stress    . Ulcerative colitis (CMS HCC)          Past Surgical History:   Procedure Laterality Date   . HX CESAREAN SECTION     . HX HYSTERECTOMY      . ORIF ANKLE FRACTURE     . TOTAL ABDOMINAL HYSTERECTOMY           Family Medical History:     Problem Relation (Age of Onset)    Other Mother, Father            Social History     Tobacco Use   . Smoking status: Never Smoker   . Smokeless tobacco: Never Used   Substance Use Topics   . Alcohol use: No   . Drug use: Never       Review of Systems   Constitutional: Negative.    HENT: Negative.    Eyes: Negative.    Respiratory: Negative.    Cardiovascular: Negative.    Gastrointestinal: Negative.    Endocrine: Negative.    Genitourinary: Negative.    Musculoskeletal: Positive for arthralgias.   Skin: Negative.    Allergic/Immunologic: Negative.    Neurological: Negative.  Hematological: Negative.    Psychiatric/Behavioral: Positive for sleep disturbance. Negative for self-injury and suicidal ideas. The patient is nervous/anxious.      Objective:   Vitals: BP 128/66   Pulse (!) 101   Temp 36.4 C (97.6 F) (Tympanic)   Ht 1.575 m (5\' 2" )   Wt 78 kg (172 lb)   SpO2 96%   BMI 31.46 kg/m         Physical Exam   Constitutional: She is oriented to person, place, and time. She appears well-developed and well-nourished.   HENT:   Head: Normocephalic and atraumatic.   Eyes: Pupils are equal, round, and reactive to light. Conjunctivae and EOM are normal.   Neck: Normal range of motion. Neck supple.   Cardiovascular: Normal rate, regular rhythm, normal heart sounds and intact distal pulses. Exam reveals no gallop and no friction rub.   No murmur heard.  Pulmonary/Chest: Effort normal and breath sounds normal.   Abdominal: Soft. Bowel sounds are normal.   Musculoskeletal: Normal range of motion.   Neurological: She is alert and oriented to person, place, and time. She has normal reflexes.   Skin: Skin is warm and dry.   Psychiatric: She has a normal mood and affect. Her behavior is normal. Judgment and thought content normal.   Nursing note and vitals reviewed.      Assessment & Plan:     ENCOUNTER DIAGNOSES      ICD-10-CM   1. Essential hypertension I10   2. Type 2 diabetes mellitus without complication, without long-term current use of insulin (CMS HCC) E11.9   3. Anxiety F41.9   4. Mood disorder (CMS HCC) F39   5. Cervical spinal stenosis M48.02       Orders Placed This Encounter   . clonazePAM (KLONOPIN) 0.5 mg Oral Tablet   diabetic counseling done.  defers labs/mammogram/colonocopy.  Return in about 3 months (around 11/04/2018), or if symptoms worsen or fail to improve.    [name], PA-C

## 2018-08-15 DIAGNOSIS — L84 Corns and callosities: Secondary | ICD-10-CM | POA: Insufficient documentation

## 2018-10-13 ENCOUNTER — Emergency Department
Admission: EM | Admit: 2018-10-13 | Discharge: 2018-10-13 | Disposition: A | Discharge status: Home / Self Care | Payer: Commercial Managed Care - PPO | Attending: Specialist | Admitting: Specialist

## 2018-10-13 ENCOUNTER — Other Ambulatory Visit: Payer: Self-pay

## 2018-10-13 ENCOUNTER — Encounter (HOSPITAL_COMMUNITY): Payer: Self-pay

## 2018-10-13 DIAGNOSIS — I10 Essential (primary) hypertension: Secondary | ICD-10-CM | POA: Insufficient documentation

## 2018-10-13 DIAGNOSIS — L97229 Non-pressure chronic ulcer of left calf with unspecified severity: Secondary | ICD-10-CM | POA: Insufficient documentation

## 2018-10-13 DIAGNOSIS — E785 Hyperlipidemia, unspecified: Secondary | ICD-10-CM | POA: Insufficient documentation

## 2018-10-13 DIAGNOSIS — Z886 Allergy status to analgesic agent status: Secondary | ICD-10-CM | POA: Insufficient documentation

## 2018-10-13 DIAGNOSIS — K219 Gastro-esophageal reflux disease without esophagitis: Secondary | ICD-10-CM | POA: Insufficient documentation

## 2018-10-13 DIAGNOSIS — Z885 Allergy status to narcotic agent status: Secondary | ICD-10-CM | POA: Insufficient documentation

## 2018-10-13 DIAGNOSIS — Z888 Allergy status to other drugs, medicaments and biological substances status: Secondary | ICD-10-CM | POA: Insufficient documentation

## 2018-10-13 DIAGNOSIS — R21 Rash and other nonspecific skin eruption: Principal | ICD-10-CM | POA: Insufficient documentation

## 2018-10-13 DIAGNOSIS — E11622 Type 2 diabetes mellitus with other skin ulcer: Secondary | ICD-10-CM | POA: Insufficient documentation

## 2018-10-13 MED ORDER — AMOXICILLIN 875 MG-POTASSIUM CLAVULANATE 125 MG TABLET
1.00 | ORAL_TABLET | Freq: Two times a day (BID) | ORAL | 0 refills | Status: AC
Start: 2018-10-13 — End: 2018-10-23

## 2018-10-13 MED ORDER — AMOXICILLIN 875 MG-POTASSIUM CLAVULANATE 125 MG TABLET
ORAL_TABLET | ORAL | Status: AC
Start: 2018-10-13 — End: 2018-10-13
  Filled 2018-10-13: qty 1

## 2018-10-13 MED ORDER — AMOXICILLIN 875 MG-POTASSIUM CLAVULANATE 125 MG TABLET
1.00 | ORAL_TABLET | ORAL | Status: AC
Start: 2018-10-13 — End: 2018-10-13
  Administered 2018-10-13: 1 via ORAL

## 2018-10-13 NOTE — ED Triage Notes (Signed)
c/o rash on right lower leg that started 4 days ago.

## 2018-10-14 ENCOUNTER — Telehealth (HOSPITAL_COMMUNITY): Payer: Self-pay | Admitting: PHYSICIAN ASSISTANT

## 2018-10-14 NOTE — Telephone Encounter (Signed)
Chart reviewed prior to patient contact.  Follow-up call placed to patient in regards to ED discharge on 10/13/2018.  Discharge instructions reviewed with patient including diagnosis, medications, follow-up appointments, future appointments, and patient education.  Patient provided [facility] direct line phone number and educated on services provided.  Following interventions were completed:  No Needs Identified. Patient states she is improved. Patient states she got very good care from the ED yesterday. Brent Bullaarolyn Clarity Ciszek, RN  10/14/2018, 12:51

## 2018-10-16 ENCOUNTER — Other Ambulatory Visit (HOSPITAL_BASED_OUTPATIENT_CLINIC_OR_DEPARTMENT_OTHER): Payer: Self-pay | Admitting: PHYSICIAN ASSISTANT

## 2018-10-16 DIAGNOSIS — E039 Hypothyroidism, unspecified: Secondary | ICD-10-CM

## 2018-10-16 MED ORDER — LEVOTHYROXINE 50 MCG TABLET
50.00 ug | ORAL_TABLET | Freq: Every morning | ORAL | 3 refills | Status: DC
Start: 2018-10-16 — End: 2019-01-17

## 2018-10-17 DIAGNOSIS — R21 Rash and other nonspecific skin eruption: Secondary | ICD-10-CM

## 2018-10-17 DIAGNOSIS — Z888 Allergy status to other drugs, medicaments and biological substances status: Secondary | ICD-10-CM

## 2018-10-17 DIAGNOSIS — Z886 Allergy status to analgesic agent status: Secondary | ICD-10-CM

## 2018-10-17 DIAGNOSIS — Z885 Allergy status to narcotic agent status: Secondary | ICD-10-CM

## 2018-10-17 NOTE — ED Provider Notes (Signed)
Elizabeth Page   1946/07/31   72 y.o.   female     Chief Complaint:   Chief Complaint   Patient presents with   . Rash        HPI: This is a 72 y.o. female who presents to the emergency department with complaints of having a rash or an infected area on the posterior aspect of her left calf.  The patient is a diabetic.  She has had an injury to that leg which has left a an ischemic ulcer.  She has been caring for that ulcer with topical creams.  She has kept it bandaged to keep it from becoming secondarily infected.  Now she has a rectangular pattern and rash in the shape of the Band-Aid that she has been using.  The underlying ulcer does seem to have a purulence appearance, and the skin surrounding it appears irritated and erythematous.  ?   ?   ?   Past Medical History:   Past Medical History:   Diagnosis Date   . Anxiety    . Depression    . Diabetes mellitus, type 2 (CMS HCC)    . GERD (gastroesophageal reflux disease)    . History of concussion    . HTN (hypertension)    . Hyperlipidemia    . Stress    . Ulcerative colitis (CMS HCC)         Past Surgical History:   Past Surgical History:   Procedure Laterality Date   . Hx cesarean section     . Hx hysterectomy     . Orif ankle fracture     . Total abdominal hysterectomy          Social History:   Social History     Tobacco Use   . Smoking status: Never Smoker   . Smokeless tobacco: Never Used   Substance Use Topics   . Alcohol use: No   . Drug use: Never      Social History     Substance and Sexual Activity   Drug Use Never         Allergies:   Allergies   Allergen Reactions   . Allergy-Decongestant [Dexbrompheniramine-Pseudoephed]    . Codeine    . Cortisone    . Motrin [Ibuprofen]       ?     Review of Systems: 8 systems reviewed and negative except as noted in HPI.   ?     Physical Exam:     General: Patient alert and oriented x4. No acute distress. BP 98/65   Pulse 85   Temp 36.9 C (98.5 F)   Resp 16   Ht 1.575 m (5\' 2" )   Wt 74.4 kg (164 lb)   SpO2 96%    BMI 30.00 kg/m          HEENT: Head: Normocephalic, atraumatic. Eyes: Normal conjunctiva. Oral mucosa moist.    Respiratory: No respiratory distress noted.     MS: Appropriate ROM. Normal Strength.     Neuro: Alert and oriented x4. No focal motor or sensory deficits. GCS 15.    Skin: Warm and dry.  Focused exam of the skin on the posterior aspect of her left leg shows a to have cm by 1 cm ischemic ulcer with a purulence Center.  Surrounding that ulcer is an a rectangular pattern of eruption on the leg consisting of erythematous macular papules      ?   ?   ?  Medical Decision Making: Patient was triaged, vital signs were obtained, patient was placed in a room. I did examine the patient. After examining the patient explained that I had wanted to get the patient on antibiotic in order to address any underlying infection in the ulcer itself.  It does appear that the pattern of the eruption on her skin is probably due to contact with the adhesive of the Band-Aid.     The patient was advised to change the meth a dressing the wound, and to follow up with her PCP for recheck of wound.    ?   ?  This note was partially created using voice recognition software and is inherently subject to errors including those of syntax and "sound alike " substitutions which may escape proof reading.  In such instances, original meaning may be extrapolated by contextual derivation.     ?   Clinical Impression:   Encounter Diagnosis   Name Primary?   . Rash and nonspecific skin eruption Yes      ?   ?   Disposition: Discharged          Procedures

## 2018-11-07 ENCOUNTER — Ambulatory Visit: Payer: Commercial Managed Care - PPO | Attending: PHYSICIAN ASSISTANT | Admitting: PHYSICIAN ASSISTANT

## 2018-11-07 ENCOUNTER — Encounter (HOSPITAL_BASED_OUTPATIENT_CLINIC_OR_DEPARTMENT_OTHER): Payer: Self-pay | Admitting: PHYSICIAN ASSISTANT

## 2018-11-07 VITALS — BP 120/62 | HR 105 | Temp 97.6°F | Ht 62.0 in | Wt 181.0 lb

## 2018-11-07 DIAGNOSIS — Z79899 Other long term (current) drug therapy: Secondary | ICD-10-CM | POA: Insufficient documentation

## 2018-11-07 DIAGNOSIS — F39 Unspecified mood [affective] disorder: Secondary | ICD-10-CM

## 2018-11-07 DIAGNOSIS — I10 Essential (primary) hypertension: Secondary | ICD-10-CM | POA: Insufficient documentation

## 2018-11-07 DIAGNOSIS — K219 Gastro-esophageal reflux disease without esophagitis: Secondary | ICD-10-CM | POA: Insufficient documentation

## 2018-11-07 DIAGNOSIS — Z79890 Hormone replacement therapy (postmenopausal): Secondary | ICD-10-CM | POA: Insufficient documentation

## 2018-11-07 DIAGNOSIS — F419 Anxiety disorder, unspecified: Principal | ICD-10-CM | POA: Insufficient documentation

## 2018-11-07 DIAGNOSIS — F329 Major depressive disorder, single episode, unspecified: Secondary | ICD-10-CM | POA: Insufficient documentation

## 2018-11-07 DIAGNOSIS — Z888 Allergy status to other drugs, medicaments and biological substances status: Secondary | ICD-10-CM | POA: Insufficient documentation

## 2018-11-07 DIAGNOSIS — E785 Hyperlipidemia, unspecified: Secondary | ICD-10-CM | POA: Insufficient documentation

## 2018-11-07 DIAGNOSIS — E119 Type 2 diabetes mellitus without complications: Secondary | ICD-10-CM | POA: Insufficient documentation

## 2018-11-07 DIAGNOSIS — Z885 Allergy status to narcotic agent status: Secondary | ICD-10-CM | POA: Insufficient documentation

## 2018-11-07 DIAGNOSIS — Z7984 Long term (current) use of oral hypoglycemic drugs: Secondary | ICD-10-CM | POA: Insufficient documentation

## 2018-11-07 DIAGNOSIS — G479 Sleep disorder, unspecified: Secondary | ICD-10-CM | POA: Insufficient documentation

## 2018-11-07 DIAGNOSIS — Z886 Allergy status to analgesic agent status: Secondary | ICD-10-CM | POA: Insufficient documentation

## 2018-11-13 ENCOUNTER — Other Ambulatory Visit (HOSPITAL_BASED_OUTPATIENT_CLINIC_OR_DEPARTMENT_OTHER): Payer: Self-pay | Admitting: PHYSICIAN ASSISTANT

## 2018-11-13 ENCOUNTER — Other Ambulatory Visit (HOSPITAL_BASED_OUTPATIENT_CLINIC_OR_DEPARTMENT_OTHER): Payer: Self-pay | Admitting: Internal Medicine

## 2018-11-13 MED ORDER — DULOXETINE 60 MG CAPSULE,DELAYED RELEASE
DELAYED_RELEASE_CAPSULE | ORAL | 3 refills | Status: DC
Start: 2018-11-13 — End: 2019-01-18

## 2018-11-15 ENCOUNTER — Other Ambulatory Visit (HOSPITAL_BASED_OUTPATIENT_CLINIC_OR_DEPARTMENT_OTHER): Payer: Self-pay | Admitting: PHYSICIAN ASSISTANT

## 2018-11-15 MED ORDER — CLONAZEPAM 0.5 MG TABLET: 1 mg | Tab | Freq: Three times a day (TID) | ORAL | 2 refills | 0 days | Status: AC | PRN

## 2018-11-23 ENCOUNTER — Encounter (HOSPITAL_BASED_OUTPATIENT_CLINIC_OR_DEPARTMENT_OTHER): Payer: Self-pay | Admitting: PHYSICIAN ASSISTANT

## 2018-11-23 NOTE — Progress Notes (Addendum)
[facility]  [address]  [place] [address]  Phone: [phone]    Encounter Date: 11/07/2018    Patient ID:  Elizabeth Page  DTH:[id number]    DOB: 17-Jun-1946  Age: 73 y.o. female    Subjective:     Chief Complaint   Patient presents with   . Follow Up     3 mo f/u      here for a follow up.  worsening anxiety.  would like to see psych.  glucose has been good.  feels fatigued a lot.  doing ok with meds.  does not think psych meds are helping much.    The history is provided by the patient.     Current Outpatient Medications   Medication Sig   . ACCU-CHEK AVIVA CONTROL SOLN Solution    . ACCU-CHEK SOFTCLIX LANCETS Misc    . Amlodipine-Olmesartan 5-20 mg Oral Tablet Take 1 Tab by mouth Once a day   . B COMPLEX-VITAMIN B12 Oral Tablet    . clonazePAM (KLONOPIN) 0.5 mg Oral Tablet Take 1 Tab (0.5 mg total) by mouth Three times a day as needed   . DULoxetine (CYMBALTA DR) 60 mg Oral Capsule, Delayed Release(E.C.) TAKE 1 CAPSULE BY MOUTH ONCE A DAY   . levothyroxine (SYNTHROID) 50 mcg Oral Tablet Take 1 Tab (50 mcg total) by mouth Every morning   . metformin (GLUCOPHAGE-XR) 750 mg Oral Tablet Sustained Release 24 hr Take 1 Tab (750 mg total) by mouth Twice daily   . nystatin (MYCOSTATIN) 100,000 unit/gram Cream by Apply Topically route Twice daily   . nystatin (MYCOSTATIN) 100,000 unit/mL Oral Suspension swish and swallow 5 ml tid   . pantoprazole (PROTONIX) 40 mg Oral Tablet, Delayed Release (E.C.) Take 1 Tab (40 mg total) by mouth Once a day   . VITAMIN D 50,000 unit Oral Capsule TAKE ONE CAPSULE BY MOUTH ONCE WEEKLY     Allergies   Allergen Reactions   . Allergy-Decongestant [Dexbrompheniramine-Pseudoephed]    . Codeine    . Cortisone    . Motrin [Ibuprofen]      Past Medical History:   Diagnosis Date   . Anxiety    . Depression    . Diabetes mellitus, type 2 (CMS HCC)    . GERD (gastroesophageal reflux disease)    . History of concussion    . HTN (hypertension)    . Hyperlipidemia    . Stress    . Ulcerative  colitis (CMS HCC)          Past Surgical History:   Procedure Laterality Date   . HX CESAREAN SECTION     . HX HYSTERECTOMY     . ORIF ANKLE FRACTURE     . TOTAL ABDOMINAL HYSTERECTOMY           Family Medical History:     Problem Relation (Age of Onset)    Other Mother, Father            Social History     Tobacco Use   . Smoking status: Never Smoker   . Smokeless tobacco: Never Used   Substance Use Topics   . Alcohol use: No   . Drug use: Never       Review of Systems   Constitutional: Positive for fatigue.   HENT: Negative.    Eyes: Negative.    Respiratory: Negative.    Cardiovascular: Negative.    Gastrointestinal: Negative.    Endocrine: Negative.    Genitourinary:  Negative.    Musculoskeletal: Positive for arthralgias and back pain.   Skin: Negative.    Allergic/Immunologic: Negative.    Neurological: Negative.    Hematological: Negative.    Psychiatric/Behavioral: Positive for sleep disturbance. Negative for self-injury and suicidal ideas. The patient is nervous/anxious.      Objective:   Vitals: BP 120/62   Pulse (!) 105   Temp 36.4 C (97.6 F) (Tympanic)   Ht 1.575 m (5\' 2" )   Wt 82.1 kg (181 lb)   SpO2 97%   BMI 33.11 kg/m         Physical Exam  Vitals signs and nursing note reviewed.   Constitutional:       Appearance: She is well-developed.   HENT:      Head: Normocephalic and atraumatic.   Eyes:      Conjunctiva/sclera: Conjunctivae normal.      Pupils: Pupils are equal, round, and reactive to light.   Neck:      Musculoskeletal: Normal range of motion and neck supple.   Cardiovascular:      Rate and Rhythm: Normal rate and regular rhythm.      Heart sounds: Normal heart sounds. No murmur. No friction rub. No gallop.    Pulmonary:      Effort: Pulmonary effort is normal.      Breath sounds: Normal breath sounds.   Abdominal:      General: Bowel sounds are normal.      Palpations: Abdomen is soft.   Musculoskeletal:      Right knee: She exhibits decreased range of motion. Tenderness found.       Left knee: She exhibits decreased range of motion. Tenderness found.      Lumbar back: She exhibits decreased range of motion and tenderness.   Skin:     General: Skin is warm and dry.   Neurological:      Mental Status: She is alert and oriented to person, place, and time.      Deep Tendon Reflexes: Reflexes are normal and symmetric.   Psychiatric:         Behavior: Behavior normal.         Thought Content: Thought content normal.         Judgment: Judgment normal.         Assessment & Plan:     ENCOUNTER DIAGNOSES     ICD-10-CM   1. Mood disorder (CMS HCC) F39   2. Essential hypertension I10   3. GERD without esophagitis K21.9   4. Type 2 diabetes mellitus without complication, without long-term current use of insulin (CMS HCC) E11.9   5. Anxiety F41.9       Orders Placed This Encounter   . CBC/DIFF   . Comp Metabolic Panel-Fasting   . TSH w/ Free T4 Reflex   . Hemoglobin A1C   . Lipid Panel   . Refer to [facility]       Return in about 3 months (around 02/06/2019), or if symptoms worsen or fail to improve.    [name], PA-C

## 2018-12-11 ENCOUNTER — Telehealth (HOSPITAL_BASED_OUTPATIENT_CLINIC_OR_DEPARTMENT_OTHER): Payer: Self-pay | Admitting: PHYSICIAN ASSISTANT

## 2018-12-11 ENCOUNTER — Other Ambulatory Visit (HOSPITAL_BASED_OUTPATIENT_CLINIC_OR_DEPARTMENT_OTHER): Payer: Self-pay | Admitting: PHYSICIAN ASSISTANT

## 2018-12-11 ENCOUNTER — Ambulatory Visit (HOSPITAL_BASED_OUTPATIENT_CLINIC_OR_DEPARTMENT_OTHER): Payer: Self-pay | Admitting: PSYCHIATRY

## 2018-12-11 MED ORDER — FAMOTIDINE 40 MG TABLET: 40 mg | Tab | Freq: Two times a day (BID) | ORAL | 3 refills | 0 days | Status: AC

## 2018-12-11 NOTE — Telephone Encounter (Signed)
C/o terrible GERD and acid reflux even though she is still taking Protonix 40 mg daily.  Uses [facility].

## 2018-12-11 NOTE — Telephone Encounter (Signed)
i sent in Pepcid.  [name], PA-C  12/11/2018, 18:19

## 2018-12-15 ENCOUNTER — Encounter (HOSPITAL_BASED_OUTPATIENT_CLINIC_OR_DEPARTMENT_OTHER): Payer: Self-pay | Admitting: PHYSICIAN ASSISTANT

## 2018-12-29 ENCOUNTER — Encounter (HOSPITAL_BASED_OUTPATIENT_CLINIC_OR_DEPARTMENT_OTHER): Payer: Self-pay | Admitting: PHYSICIAN ASSISTANT

## 2018-12-29 ENCOUNTER — Ambulatory Visit: Payer: Commercial Managed Care - PPO | Attending: PHYSICIAN ASSISTANT | Admitting: PHYSICIAN ASSISTANT

## 2018-12-29 ENCOUNTER — Other Ambulatory Visit: Payer: Self-pay

## 2018-12-29 VITALS — BP 126/74 | HR 111 | Ht 62.0 in | Wt 179.0 lb

## 2018-12-29 DIAGNOSIS — M542 Cervicalgia: Secondary | ICD-10-CM | POA: Insufficient documentation

## 2018-12-29 DIAGNOSIS — Z888 Allergy status to other drugs, medicaments and biological substances status: Secondary | ICD-10-CM | POA: Insufficient documentation

## 2018-12-29 DIAGNOSIS — F39 Unspecified mood [affective] disorder: Secondary | ICD-10-CM | POA: Insufficient documentation

## 2018-12-29 DIAGNOSIS — E039 Hypothyroidism, unspecified: Secondary | ICD-10-CM | POA: Insufficient documentation

## 2018-12-29 DIAGNOSIS — F329 Major depressive disorder, single episode, unspecified: Secondary | ICD-10-CM | POA: Insufficient documentation

## 2018-12-29 DIAGNOSIS — M4802 Spinal stenosis, cervical region: Principal | ICD-10-CM | POA: Insufficient documentation

## 2018-12-29 DIAGNOSIS — E119 Type 2 diabetes mellitus without complications: Secondary | ICD-10-CM | POA: Insufficient documentation

## 2018-12-29 DIAGNOSIS — Z885 Allergy status to narcotic agent status: Secondary | ICD-10-CM | POA: Insufficient documentation

## 2018-12-29 DIAGNOSIS — K219 Gastro-esophageal reflux disease without esophagitis: Secondary | ICD-10-CM | POA: Insufficient documentation

## 2018-12-29 DIAGNOSIS — Z7984 Long term (current) use of oral hypoglycemic drugs: Secondary | ICD-10-CM | POA: Insufficient documentation

## 2018-12-29 DIAGNOSIS — I10 Essential (primary) hypertension: Secondary | ICD-10-CM

## 2018-12-29 DIAGNOSIS — R110 Nausea: Secondary | ICD-10-CM | POA: Insufficient documentation

## 2018-12-29 DIAGNOSIS — Z886 Allergy status to analgesic agent status: Secondary | ICD-10-CM | POA: Insufficient documentation

## 2018-12-29 DIAGNOSIS — E785 Hyperlipidemia, unspecified: Secondary | ICD-10-CM | POA: Insufficient documentation

## 2018-12-29 DIAGNOSIS — Z794 Long term (current) use of insulin: Secondary | ICD-10-CM

## 2018-12-29 DIAGNOSIS — Z79899 Other long term (current) drug therapy: Secondary | ICD-10-CM | POA: Insufficient documentation

## 2018-12-29 DIAGNOSIS — F419 Anxiety disorder, unspecified: Secondary | ICD-10-CM | POA: Insufficient documentation

## 2018-12-29 DIAGNOSIS — Z79890 Hormone replacement therapy (postmenopausal): Secondary | ICD-10-CM | POA: Insufficient documentation

## 2018-12-29 MED ORDER — SUCRALFATE 1 GRAM TABLET: 1 g | Tab | Freq: Two times a day (BID) | ORAL | 2 refills | 0 days | Status: DC

## 2018-12-29 NOTE — Nursing Note (Signed)
12/29/18 1400   B12 Injection flowsheet   Medication Vitamin B12   Dose (mcg) 1,000 mcg   Site Left Gluteus   Route IM   Lot # [id number]   Expiration date 10/19/19   Manufacturer [employer]   NDC # 79024-097-35   Initials vd   Who supplied B12 Clinic supplied

## 2018-12-29 NOTE — Progress Notes (Signed)
[facility]  [address]  [place] [address]  Phone: [phone]    Encounter Date: 12/29/2018    Patient ID:  Elizabeth Page  DIY:[id number]    DOB: 02-10-1946  Age: 73 y.o. female    Subjective:     Chief Complaint   Patient presents with   . Difficulty Sleeping     difficulty sleeping      c/o worsening anxiety over the past several months and not sleeping well.  has appt to see psych next month.  worsening cervical pain with known cervical spinal stenosis.  pain radiates into arms at times.  burning in stomach at times and nausea.    The history is provided by the patient.     Current Outpatient Medications   Medication Sig   . ACCU-CHEK AVIVA CONTROL SOLN Solution    . ACCU-CHEK SOFTCLIX LANCETS Misc    . Amlodipine-Olmesartan 5-20 mg Oral Tablet Take 1 Tab by mouth Once a day   . B COMPLEX-VITAMIN B12 Oral Tablet    . clonazePAM (KLONOPIN) 0.5 mg Oral Tablet Take 1 Tab (0.5 mg total) by mouth Three times a day as needed   . DULoxetine (CYMBALTA DR) 60 mg Oral Capsule, Delayed Release(E.C.) TAKE 1 CAPSULE BY MOUTH ONCE A DAY   . famotidine (PEPCID) 40 mg Oral Tablet Take 1 Tab (40 mg total) by mouth Twice daily   . levothyroxine (SYNTHROID) 50 mcg Oral Tablet Take 1 Tab (50 mcg total) by mouth Every morning   . metformin (GLUCOPHAGE-XR) 750 mg Oral Tablet Sustained Release 24 hr Take 1 Tab (750 mg total) by mouth Twice daily   . nystatin (MYCOSTATIN) 100,000 unit/gram Cream by Apply Topically route Twice daily   . nystatin (MYCOSTATIN) 100,000 unit/mL Oral Suspension swish and swallow 5 ml tid   . pantoprazole (PROTONIX) 40 mg Oral Tablet, Delayed Release (E.C.) Take 1 Tab (40 mg total) by mouth Once a day   . sucralfate (CARAFATE) 1 gram Oral Tablet Take 1 Tab (1 g total) by mouth Twice a day before meals   . VITAMIN D 50,000 unit Oral Capsule TAKE ONE CAPSULE BY MOUTH ONCE WEEKLY     Allergies   Allergen Reactions   . Allergy-Decongestant [Dexbrompheniramine-Pseudoephed]    . Codeine    . Cortisone       . Motrin [Ibuprofen]      Past Medical History:   Diagnosis Date   . Anxiety    . Depression    . Diabetes mellitus, type 2 (CMS HCC)    . GERD (gastroesophageal reflux disease)    . History of concussion    . HTN (hypertension)    . Hyperlipidemia    . Stress    . Ulcerative colitis (CMS HCC)          Past Surgical History:   Procedure Laterality Date   . HX CESAREAN SECTION     . HX HYSTERECTOMY     . ORIF ANKLE FRACTURE     . TOTAL ABDOMINAL HYSTERECTOMY           Family Medical History:     Problem Relation (Age of Onset)    Other Mother, Father            Social History     Tobacco Use   . Smoking status: Never Smoker   . Smokeless tobacco: Never Used   Substance Use Topics   . Alcohol use: No   . Drug use: Never  Review of Systems   Constitutional: Positive for fatigue.   HENT: Negative.    Eyes: Negative.    Respiratory: Negative.    Cardiovascular: Negative.    Gastrointestinal: Negative.    Endocrine: Negative.    Genitourinary: Negative.    Musculoskeletal: Positive for arthralgias, back pain, myalgias and neck pain.   Skin: Negative.    Allergic/Immunologic: Negative.    Neurological: Positive for weakness.   Hematological: Negative.    Psychiatric/Behavioral: Positive for sleep disturbance. Negative for self-injury and suicidal ideas. The patient is nervous/anxious.      Objective:   Vitals: BP 126/74   Pulse (!) 111   Ht 1.575 m (5\' 2" )   Wt 81.2 kg (179 lb)   SpO2 98%   BMI 32.74 kg/m         Physical Exam  Vitals signs and nursing note reviewed.   Constitutional:       Appearance: She is well-developed.   HENT:      Head: Normocephalic and atraumatic.   Eyes:      Conjunctiva/sclera: Conjunctivae normal.      Pupils: Pupils are equal, round, and reactive to light.   Neck:      Musculoskeletal: Normal range of motion and neck supple.   Cardiovascular:      Rate and Rhythm: Normal rate and regular rhythm.      Heart sounds: Normal heart sounds. No murmur. No friction rub. No gallop.     Pulmonary:      Effort: Pulmonary effort is normal.      Breath sounds: Normal breath sounds.   Abdominal:      General: Bowel sounds are normal.      Palpations: Abdomen is soft.   Musculoskeletal:      Cervical back: She exhibits decreased range of motion, tenderness and spasm.   Skin:     General: Skin is warm and dry.   Neurological:      Mental Status: She is alert and oriented to person, place, and time.      Deep Tendon Reflexes: Reflexes are normal and symmetric.   Psychiatric:         Behavior: Behavior normal.         Thought Content: Thought content normal.         Judgment: Judgment normal.         Assessment & Plan:     ENCOUNTER DIAGNOSES     ICD-10-CM   1. Cervical spinal stenosis M48.02   2. Mood disorder (CMS HCC) F39   3. Anxiety F41.9   4. Essential hypertension I10   5. Type 2 diabetes mellitus without complication, without long-term current use of insulin (CMS HCC) E11.9   6. GERD without esophagitis K21.9   7. Acquired hypothyroidism E03.9       Orders Placed This Encounter   . MRI Cervical Spine WO   . CBC/DIFF   . Comp Metabolic Panel-Fasting   . TSH w/ Free T4 Reflex   . Hemoglobin A1C   . Lipid Panel   . sucralfate (CARAFATE) 1 gram Oral Tablet   defers referral to GI.    Return in about 3 months (around 03/31/2019), or if symptoms worsen or fail to improve.    [name], PA-C

## 2019-01-10 ENCOUNTER — Other Ambulatory Visit: Payer: Self-pay

## 2019-01-10 ENCOUNTER — Ambulatory Visit: Payer: Commercial Managed Care - PPO | Attending: Otolaryngology | Admitting: Otolaryngology

## 2019-01-10 VITALS — Ht 62.5 in | Wt 174.0 lb

## 2019-01-10 DIAGNOSIS — H60543 Acute eczematoid otitis externa, bilateral: Principal | ICD-10-CM | POA: Insufficient documentation

## 2019-01-10 DIAGNOSIS — Z7984 Long term (current) use of oral hypoglycemic drugs: Secondary | ICD-10-CM | POA: Insufficient documentation

## 2019-01-10 DIAGNOSIS — H903 Sensorineural hearing loss, bilateral: Secondary | ICD-10-CM | POA: Insufficient documentation

## 2019-01-10 DIAGNOSIS — E119 Type 2 diabetes mellitus without complications: Secondary | ICD-10-CM | POA: Insufficient documentation

## 2019-01-10 DIAGNOSIS — Z885 Allergy status to narcotic agent status: Secondary | ICD-10-CM | POA: Insufficient documentation

## 2019-01-10 DIAGNOSIS — Z886 Allergy status to analgesic agent status: Secondary | ICD-10-CM | POA: Insufficient documentation

## 2019-01-10 DIAGNOSIS — I10 Essential (primary) hypertension: Secondary | ICD-10-CM | POA: Insufficient documentation

## 2019-01-10 DIAGNOSIS — F419 Anxiety disorder, unspecified: Secondary | ICD-10-CM | POA: Insufficient documentation

## 2019-01-10 DIAGNOSIS — K219 Gastro-esophageal reflux disease without esophagitis: Secondary | ICD-10-CM | POA: Insufficient documentation

## 2019-01-10 DIAGNOSIS — E785 Hyperlipidemia, unspecified: Secondary | ICD-10-CM | POA: Insufficient documentation

## 2019-01-10 DIAGNOSIS — F329 Major depressive disorder, single episode, unspecified: Secondary | ICD-10-CM | POA: Insufficient documentation

## 2019-01-10 DIAGNOSIS — Z888 Allergy status to other drugs, medicaments and biological substances status: Secondary | ICD-10-CM | POA: Insufficient documentation

## 2019-01-10 DIAGNOSIS — Z79890 Hormone replacement therapy (postmenopausal): Secondary | ICD-10-CM | POA: Insufficient documentation

## 2019-01-10 DIAGNOSIS — Z79899 Other long term (current) drug therapy: Secondary | ICD-10-CM | POA: Insufficient documentation

## 2019-01-10 MED ORDER — MOMETASONE 0.1 % TOPICAL CREAM
TOPICAL_CREAM | Freq: Every day | CUTANEOUS | 0 refills | Status: DC
Start: 2019-01-10 — End: 2019-01-18

## 2019-01-10 NOTE — Progress Notes (Signed)
[facility] [place]  [address]  [place] [address]  Operated by [facility]  History and Physical     Name: Elizabeth Page MRN:  [id number]   Date: 01/10/2019 Age: 73 y.o.         Reason for Visit:    Chief Complaint   Patient presents with   . Wax in Ear       History of Present Illness:  Elizabeth Page is a 73 y.o. female who presents to the office today for bilateral ear itching.  The patient wears hearing aids but has significant amount of ear itching when she wears them.  They do help her when she wears them.  She does not complain of fluctuating hearing loss, vertigo or otorrhea.                                                                                                                                                                                                    Patient Active Problem List    Diagnosis   . Pre-ulcerative corn or callous   . Anxiety   . Type 2 diabetes mellitus without complication, without long-term current use of insulin (CMS HCC)   . Mood disorder (CMS HCC)   . Essential hypertension   . GERD without esophagitis   . Annular tear of cervical disc   . HNP (herniated nucleus pulposus), cervical   . Cervical spinal stenosis     Past Medical History:   Diagnosis Date   . Anxiety    . Depression    . Diabetes mellitus, type 2 (CMS HCC)    . GERD (gastroesophageal reflux disease)    . History of concussion    . HTN (hypertension)    . Hyperlipidemia    . Stress    . Ulcerative colitis (CMS HCC)      Past Surgical History:   Procedure Laterality Date   . Hx cesarean section     . Hx hysterectomy     . Orif ankle fracture     . Total abdominal hysterectomy       Current Outpatient Medications   Medication Sig   . ACCU-CHEK AVIVA CONTROL SOLN Solution    . ACCU-CHEK SOFTCLIX LANCETS Misc    . Amlodipine-Olmesartan 5-20 mg Oral Tablet Take 1 Tab by mouth Once a day   . B COMPLEX-VITAMIN B12 Oral Tablet    . clonazePAM (KLONOPIN) 0.5 mg Oral Tablet Take 1 Tab (0.5 mg  total) by mouth Three times a day as needed   . DULoxetine (CYMBALTA DR)  60 mg Oral Capsule, Delayed Release(E.C.) TAKE 1 CAPSULE BY MOUTH ONCE A DAY   . famotidine (PEPCID) 40 mg Oral Tablet Take 1 Tab (40 mg total) by mouth Twice daily   . levothyroxine (SYNTHROID) 50 mcg Oral Tablet Take 1 Tab (50 mcg total) by mouth Every morning   . metformin (GLUCOPHAGE-XR) 750 mg Oral Tablet Sustained Release 24 hr Take 1 Tab (750 mg total) by mouth Twice daily   . mometasone (ELOCON) 0.1 % Cream Apply topically Once a day   . nystatin (MYCOSTATIN) 100,000 unit/gram Cream by Apply Topically route Twice daily   . nystatin (MYCOSTATIN) 100,000 unit/mL Oral Suspension swish and swallow 5 ml tid   . pantoprazole (PROTONIX) 40 mg Oral Tablet, Delayed Release (E.C.) Take 1 Tab (40 mg total) by mouth Once a day   . sucralfate (CARAFATE) 1 gram Oral Tablet Take 1 Tab (1 g total) by mouth Twice a day before meals   . VITAMIN D 50,000 unit Oral Capsule TAKE ONE CAPSULE BY MOUTH ONCE WEEKLY     Allergies   Allergen Reactions   . Allergy-Decongestant [Dexbrompheniramine-Pseudoephed]    . Codeine    . Cortisone    . Motrin [Ibuprofen]      Family Medical History:     Problem Relation (Age of Onset)    Other Mother, Father             Social History     Tobacco Use   . Smoking status: Never Smoker   . Smokeless tobacco: Never Used   Substance Use Topics   . Alcohol use: No       Review of Systems:  Constitutional: Negative for chills, fever, night sweats, tiredness and unintentional weight loss  Eyes: Negative for blurred vision, dry eyes, excessive tearing, itchy eyes, or any eye or vision problems  ENT: Difficulty with hearing  Cardiovascular: Negative for chest pain, heart attack, hypertension, irregular heartbeat, murmur, or pacemaker  Respiratory: Negative for asthma, cough, exposure to tuberculosis, shortness of breath, sleep apnea, or snoring  Gastrointestinal: Negative for blood in stool, constipation, diarrhea, heartburn, or  pain/difficulty with swallowing  Genitourinary: Negative for blood in urine, stress incontinence, or urinary incontinence  Musculoskeletal: Negative for arthritis, gout, leg swelling, muscle cramps, or muscle pain  Skin: Negative for eczema, rash, or skin cancer  Neurological: Negative for numbness, seizures, or weakness  Psychiatric: Negative for anxiety, depression, mental illness, or panic attacks  Endocrine: Negative for cold intolerance, excessive hair growth, heat intolerance, or thyroid disease  Hematologic/Lymphatic: Negative for bleeding tendency, bruise easily, or enlarged lymph nodes  Allergic/Immunologic: Negative for seafood allergy, Xray dye allergy, environmental allergies, or allergy shots  Initials: nrc      Physical Exam:  Ht 1.588 m (5' 2.5")   Wt 78.9 kg (174 lb)   BMI 31.32 kg/m       General Appearance: Pleasant, cooperative, and in no acute distress.  Eyes: Conjunctivae/corneas clear, PERRLA, EOM's intact.  Head and Face: Normocephalic, atraumatic.  Face symmetric, no obvious lesions.   Right Ear - Pinna: Normal shape and position.    External auditory canal:  Patent without inflammation.  Mild eczema and dryness of the canal   Tympanic membrane:  Intact, translucent, good middle ear aeration.  Left Ear - Pinna: Normal shape and position.    External auditory canal:  Patent without inflammation.  Mild eczema and dryness of the canal   Tympanic membrane:  Intact, translucent, good middle ear aeration.  Nose:  External pyramid midline. Septum midline. Mucosa normal. No purulence,  polyps, or crusts.   Oral Cavity/Oropharynx: No mucosal lesions, masses, or pharyngeal asymmetry.  Neck:  No palpable thyroid, salivary gland, or neck masses.  Lymph:  No cervical adenopathy.  Cardiovascular:  Good perfusion of upper extremities.  No cyanosis of the hands or fingers.    Lungs: No apparent stridorous breathing. No acute distress.  Skin: Skin warm and dry.  Neurological: Cranial nerves are grossly  intact.  Psychiatric:  Alert and oriented x 3.    Procedure:  No notes on file    Data Reviewed:      Assessment and Plan:    ICD-10-CM    1. 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 mometasone (ELOCON) 0.1 % Cream       Follow up:  Return if symptoms worsen or fail to improve.      Tedra Coupe, MD  01/10/2019, 14:33

## 2019-01-10 NOTE — Nursing Note (Signed)
Patient here for wax.  Sintia Mckissic, MA

## 2019-01-10 NOTE — Nursing Note (Signed)
01/10/19 1400   Review of Systems   Constitutional Negative for chills, fever, night sweats, tiredness and unintentional weight loss   Eyes Negative for blurred vision, dry eyes, excessive tearing, itchy eyes, or any eye or vision problems   ENT Difficulty with hearing   Cardiovascular Negative for chest pain, heart attack, hypertension, irregular heartbeat, murmur, or pacemaker   Respiratory Negative for asthma, cough, exposure to tuberculosis, shortness of breath, sleep apnea, or snoring   Gastrointestinal Negative for blood in stool, constipation, diarrhea, heartburn, or pain/difficulty with swallowing   Genitourinary Negative for blood in urine, stress incontinence, or urinary incontinence   Musculoskeletal Negative for arthritis, gout, leg swelling, muscle cramps, or muscle pain   Skin Negative for eczema, rash, or skin cancer   Neurological Negative for numbness, seizures, or weakness   Psychiatric Negative for anxiety, depression, mental illness, or panic attacks   Endocrine Negative for cold intolerance, excessive hair growth, heat intolerance, or thyroid disease   Hematologic/Lymphatic Negative for bleeding tendency, bruise easily, or enlarged lymph nodes   Allergic/Immunologic Negative for seafood allergy, Xray dye allergy, environmental allergies, or allergy shots   Initials nrc

## 2019-01-12 ENCOUNTER — Other Ambulatory Visit (HOSPITAL_COMMUNITY): Payer: Self-pay

## 2019-01-17 ENCOUNTER — Other Ambulatory Visit (HOSPITAL_BASED_OUTPATIENT_CLINIC_OR_DEPARTMENT_OTHER): Payer: Self-pay | Admitting: PHYSICIAN ASSISTANT

## 2019-01-17 DIAGNOSIS — E119 Type 2 diabetes mellitus without complications: Secondary | ICD-10-CM

## 2019-01-17 DIAGNOSIS — E039 Hypothyroidism, unspecified: Secondary | ICD-10-CM

## 2019-01-17 MED ORDER — LEVOTHYROXINE 50 MCG TABLET
50.00 ug | ORAL_TABLET | Freq: Every morning | ORAL | 3 refills | Status: DC
Start: 2019-01-17 — End: 2019-05-16

## 2019-01-17 MED ORDER — CLONAZEPAM 0.5 MG TABLET
0.50 mg | ORAL_TABLET | Freq: Three times a day (TID) | ORAL | 2 refills | Status: DC | PRN
Start: 2019-01-17 — End: 2019-01-18

## 2019-01-17 MED ORDER — METFORMIN ER 750 MG TABLET,EXTENDED RELEASE 24 HR
750.00 mg | ORAL_TABLET | Freq: Two times a day (BID) | ORAL | 3 refills | Status: DC
Start: 2019-01-17 — End: 2019-05-16

## 2019-01-18 ENCOUNTER — Other Ambulatory Visit: Payer: Self-pay

## 2019-01-18 ENCOUNTER — Encounter (HOSPITAL_BASED_OUTPATIENT_CLINIC_OR_DEPARTMENT_OTHER): Payer: Self-pay | Admitting: PSYCHIATRY

## 2019-01-18 ENCOUNTER — Ambulatory Visit (HOSPITAL_BASED_OUTPATIENT_CLINIC_OR_DEPARTMENT_OTHER): Payer: Commercial Managed Care - PPO | Admitting: PSYCHIATRY

## 2019-01-18 VITALS — Resp 16 | Ht 63.0 in | Wt 177.0 lb

## 2019-01-18 DIAGNOSIS — F410 Panic disorder [episodic paroxysmal anxiety] without agoraphobia: Secondary | ICD-10-CM

## 2019-01-18 DIAGNOSIS — F331 Major depressive disorder, recurrent, moderate: Principal | ICD-10-CM

## 2019-01-18 MED ORDER — MIRTAZAPINE 15 MG TABLET
15.00 mg | ORAL_TABLET | Freq: Every evening | ORAL | 2 refills | Status: DC
Start: 2019-01-18 — End: 2019-03-16

## 2019-01-18 MED ORDER — CLONAZEPAM 0.5 MG TABLET
0.50 mg | ORAL_TABLET | Freq: Three times a day (TID) | ORAL | 2 refills | Status: DC | PRN
Start: 2019-01-18 — End: 2019-03-16

## 2019-01-18 MED ORDER — DULOXETINE 60 MG CAPSULE,DELAYED RELEASE
60.0000 mg | DELAYED_RELEASE_CAPSULE | Freq: Every day | ORAL | 5 refills | Status: DC
Start: 2019-01-18 — End: 2019-03-16

## 2019-01-18 NOTE — Progress Notes (Signed)
BEHAVIORAL MEDICINE, PROFESSIONAL BLDG  [address]  [place] [address]    Psychiatric Evaluation     Name: Elizabeth Page MRN:  [id number]   Date: 01/18/2019 Age: 73 y.o.       History:          IDENTIFICATION INFORMATION:  This is a 73 year old white female from [place] who is self-referred due to depressive and anxious symptoms.    CHIEF COMPLAINT:  My mind just won't shut down".    HISTORY OF PRESENTING ILLNESS:  This patient had a difficult life and that she felt the obligation to drop out of high school in the 10th grade to go to work to help support her family.  Her father was chronically ill and did not work regularly.  She took it upon herself to essentially sacrifice her future to help raise her younger brother and sister.  She got married and her husband went to [place] and came back "totally different".  She moved in her younger sister and her husband had a 52 year affair with that sister.    He committed suicide by overdose in 2002-02-08 and while the patient cared for both parents they both died in 02/08/02 as did a number of her pets.  Some depressive symptoms had started at that time and she went into treatment on outpatient basis and tried a number of antidepressants.  Her depressive symptoms worsened however around Feb 08, 2009 and she was admitted to [facility] in 09-Feb-2011 for about 2 weeks.  I discharged her on Cymbalta 60 mg daily and Klonopin 0.5 mg p.o. T.i.d..    In general she improved notably however she decided to moved to [place] in 02-09-11 to be closer to her son.  The patient was there for half to 5 years and did not get along with the son's much younger Asian wife.  The patient moved back to the [place] area in November of 2016 but realized "everything had changed".  In addition her health deteriorated and the number of friends she had before she left basically ignored her when she returned.  She was [place] around people that she did not relate  to.    It seems to her that over the last 18 months or so her medications are "not working".  She complains of depressive symptoms and anxious symptoms.  She is dwelling on her past and terribly frustrated that basically her son seems to be enamored with his wife and "does everything she says and she controls everything".  Despite the son being 35 years of age he and his wife have a 30-month-old daughter and the patient has not seen the child yet and that bothers her.  Patient is also calling her sister but the sister will not return her calls.  She does have a supportive brother but the patient dislikes his wife.    Her stressors include all the above and she is in chronic pain due to her back and neck spinal issues and arthritis.  She feels isolated and has low confidence and she still grieves for her parents.  Depressive symptoms involve the following:  Low mood, low energy, low self-worth, disrupted sleep schedule, irritability, isolation, problems concentrating, tearfulness, hopelessness, anhedonia and occasional death wishes but she denies suicidal ideations.  Her anxious symptoms involve an inability to relax, tension, worry and occasional panic attacks.  Globally, her symptoms are causing significant functional impairment.  SEVERITY:  Rates his current depression symptoms at 5-7/10 where 10 is the worst.  Rates his current anxiety symptoms at 5-7/10 where 10 is the worst.      ASSOCIATED SYMPTOMS:   Depression: Reports of symptoms of sadness, irritability, anhedonia, decreased energy, difficulty concentrating, poor attention, hopelessness, and cognitive distortions.   Anxiety: Patient reports symptoms of worry, fearful thoughts, and avoidance behaviors.  Insomnia: Reports sleeping 4-5 hours a night but naps during the day.  Sucidal ideations:  Denies    Has symptoms suggestive of generalized anxiety disorder.   Denies any obsessive thoughts or compulsive rituals.   Denies any intrusive thoughts,  traumatic memories, flashbacks or hyper-arousal symptoms.    Reports occasional low-level panic attacks. Denies any somatic symptoms.  Denies symptoms suggestive of emotional lability or emotional dysregulation.  Denies symptoms of mania or hypomania.  Denies any auditory or visual hallucinations.  Denies any concerns with inattentiveness, hyperactivity, impulsivity.  Denies history of oppositional/defiant behaviors, conduct/antisocial behaviors.        PAST PSYCHIATRY HISTORY:   Hospitalization:  1 admission in 2012.   Psychiatrist:  Numerous including some in [place].  Recently prescribed medications are by her family practitioner.  Psychotropic medication trial:  She could not recall.  Suicide attempt/SIB:  No history.  Seizures:  Never  Head injury:  Never        LEGAL HISTORY:  Negative    MEDICAL HISTORY:  See below.    Past Medical History:   Diagnosis Date   . Anxiety    . Depression    . Diabetes mellitus, type 2 (CMS HCC)    . GERD (gastroesophageal reflux disease)    . History of concussion    . HTN (hypertension)    . Hyperlipidemia    . Stress    . Ulcerative colitis (CMS HCC)            SURGICAL HISTORY:  See below p.o.    Past Surgical History:   Procedure Laterality Date   . HX CESAREAN SECTION     . HX HYSTERECTOMY     . ORIF ANKLE FRACTURE     . TOTAL ABDOMINAL HYSTERECTOMY               Current Outpatient Medications   Medication Sig Dispense Refill   . ACCU-CHEK AVIVA CONTROL SOLN Solution      . ACCU-CHEK SOFTCLIX LANCETS Misc      . Amlodipine-Olmesartan 5-20 mg Oral Tablet Take 1 Tab by mouth Once a day 90 Tab 3   . B COMPLEX-VITAMIN B12 Oral Tablet      . clonazePAM (KLONOPIN) 0.5 mg Oral Tablet Take 1 Tab (0.5 mg total) by mouth Three times a day as needed 90 Tab 2   . DULoxetine (CYMBALTA DR) 60 mg Oral Capsule, Delayed Release(E.C.) Take 1 Cap (60 mg total) by mouth Once a day for 180 days TAKE 1 CAPSULE BY MOUTH ONCE A DAY 30 Cap 5   . famotidine (PEPCID) 40 mg Oral Tablet Take 1  Tab (40 mg total) by mouth Twice daily 60 Tab 3   . levothyroxine (SYNTHROID) 50 mcg Oral Tablet Take 1 Tab (50 mcg total) by mouth Every morning 30 Tab 3   . metformin (GLUCOPHAGE-XR) 750 mg Oral Tablet Sustained Release 24 hr Take 1 Tab (750 mg total) by mouth Twice daily 60 Tab 3   . mirtazapine (REMERON) 15 mg Oral Tablet Take 1 Tab (15 mg total) by mouth Every  night for 90 days 30 Tab 2   . pantoprazole (PROTONIX) 40 mg Oral Tablet, Delayed Release (E.C.) Take 1 Tab (40 mg total) by mouth Once a day 90 Tab 3   . VITAMIN D 50,000 unit Oral Capsule TAKE ONE CAPSULE BY MOUTH ONCE WEEKLY 12 Cap 0     No current facility-administered medications for this visit.           Allergies:   Allergies   Allergen Reactions   . Allergy-Decongestant [Dexbrompheniramine-Pseudoephed]    . Codeine    . Cortisone    . Motrin [Ibuprofen]              FAMILY HISTORY/GENETIC PREDISPOSITION: Denies any family history of bipolar or schizophrenia.    SUBSTANCE USE HISTORY:  Negative.    ABUSE HISTORY:  Negative for any physical or sexual abuse.    SOCIAL HISTORY:  Oldest of 3 children born to her parents.  Her father was ill and the family was quite poor but she said she had a "wonderful" childhood.  She dropped out of school in the 10th grade and does not drive.  She has 1 son age 45 lives in [place] with his age in wife who is age 45 and the patient and she do not get along.  She basically stated she wants him to leave the woman and we discussed how that is likely not possible given that they have a child together.    She basically has no activities or hobbies.  And isolates to her apartment.  She does have faith but stopped going to church some years ago due to frustration at the hypocrisy there.    REVIEW OF SYSTEM:  12 point review of system was performed and was very positive including multiple symptoms related to her chronic medical issues such as chronic pain in her neck back and joints, fatigue, problems concentrating,  palpitations, shortness of breath, gastrointestinal issues.      Examination       Resp. rate 16, height 1.6 m ( ), weight 80.3 kg (177 lb), not currently breastfeeding.  General appearance: Appears stated age, casually dressed.  Good eye contact.  Rather feisty and animated but appropriate.  Musculoskeletal: Normal gait and station. Normal muscle strength and tone.  Speech: Articulate, appropriate with spontaneous elaborations  Mood and Affect: Congruent with mood which is described at "bad".  Mildly tense affect.  Mood is mildly depressed.  Thought process: Logical, linear and goal directed.  Association: Intact  Abnormal thought/perceptional disturbances: None  Judgement and Insight: Partial  Orientation: Alert and Oriented X 3  Recent and remote memory: Both Intact  Attention and concentration: Adequate  Language: Fluent use of English.  Fund of Knowledge: Average    ASSETS: Willingness to seek help.  WEAKNESS:  Conflict with family, isolation, living situation issues, chronic pain/medical issues, grief issues.        Assessment and plan:       ASSESSMENT:  A depressed and anxious 73 year old white female with functional impairments due to worsening symptoms.  There are also many situational factors and medical complications.    She would benefit from psychotherapy and I will refer her to Elizabeth Page to address her grief issues and to sister in essentially tolerating the intolerable regarding issues with her son and sister.    I will add Remeron 15 mg p.o. Q.h.s. To her Cymbalta and Klonopin.    No legal history.  No substance use issues.  Has  significant medical issues.  No evidence of any bipolar or schizophrenia like symptoms. Discussed the risk, benefit, side effects and the rationale for the current treatment regimen including alternates and guardian indicates understanding and agreement.      ICD-10-CM    1. 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 Panic disorder F41.0 clonazePAM (KLONOPIN) 0.5 mg Oral Tablet         TREATMENT PLAN:   Medications:  Add Remeron 15 mg p.o. Q.h.s. To Klonopin 0.5 mg p.o. T.i.d. P.r.n. And Cymbalta 60 mg p.o. Q.a.m.Marland Kitchen  Therapy:  Elizabeth Page.  Return to clinic:  6 weeks  Labs required:  She says she is scheduled for thyroid recheck and that is needed in my opinion.  Others:  None    MDM:  High complexity secondary to 2 or more chronic illness with exacerbation/progression which are new to the examining physician.  History obtained from the patient along with guardian.  I spent approximately 60 minutes on this case with the patient being present about 70% of that time and we discussed her treatment plan, issues, stressors, medications, potential medication adverse effects, medication options, I counseled her and supported her today and I obtained informed consent.  Latina [name], MD    This note was partially created using voice recognition software and is inherently subject to errors including those of syntax and "sound-alike " substitutions which may escape proofreading.  In such instances, original meaning may be extrapolated by contextual derivation.

## 2019-01-23 ENCOUNTER — Ambulatory Visit (HOSPITAL_BASED_OUTPATIENT_CLINIC_OR_DEPARTMENT_OTHER): Payer: Self-pay | Admitting: LICENSED CLINICAL SOCIAL WORKER

## 2019-02-06 ENCOUNTER — Encounter (HOSPITAL_BASED_OUTPATIENT_CLINIC_OR_DEPARTMENT_OTHER): Payer: Self-pay | Admitting: PHYSICIAN ASSISTANT

## 2019-02-06 ENCOUNTER — Ambulatory Visit: Payer: Commercial Managed Care - PPO | Attending: PHYSICIAN ASSISTANT | Admitting: PHYSICIAN ASSISTANT

## 2019-02-06 ENCOUNTER — Other Ambulatory Visit: Payer: Self-pay

## 2019-02-06 VITALS — BP 130/68 | HR 107 | Temp 97.6°F | Ht 62.0 in | Wt 174.0 lb

## 2019-02-06 DIAGNOSIS — Z79890 Hormone replacement therapy (postmenopausal): Secondary | ICD-10-CM | POA: Insufficient documentation

## 2019-02-06 DIAGNOSIS — Z888 Allergy status to other drugs, medicaments and biological substances status: Secondary | ICD-10-CM | POA: Insufficient documentation

## 2019-02-06 DIAGNOSIS — Z886 Allergy status to analgesic agent status: Secondary | ICD-10-CM | POA: Insufficient documentation

## 2019-02-06 DIAGNOSIS — Z79899 Other long term (current) drug therapy: Secondary | ICD-10-CM | POA: Insufficient documentation

## 2019-02-06 DIAGNOSIS — E1165 Type 2 diabetes mellitus with hyperglycemia: Secondary | ICD-10-CM | POA: Insufficient documentation

## 2019-02-06 DIAGNOSIS — K219 Gastro-esophageal reflux disease without esophagitis: Secondary | ICD-10-CM | POA: Insufficient documentation

## 2019-02-06 DIAGNOSIS — M5412 Radiculopathy, cervical region: Secondary | ICD-10-CM | POA: Insufficient documentation

## 2019-02-06 DIAGNOSIS — E785 Hyperlipidemia, unspecified: Secondary | ICD-10-CM | POA: Insufficient documentation

## 2019-02-06 DIAGNOSIS — F39 Unspecified mood [affective] disorder: Secondary | ICD-10-CM

## 2019-02-06 DIAGNOSIS — Z7984 Long term (current) use of oral hypoglycemic drugs: Secondary | ICD-10-CM | POA: Insufficient documentation

## 2019-02-06 DIAGNOSIS — F13930 Sedative, hypnotic or anxiolytic use, unspecified with withdrawal, uncomplicated: Secondary | ICD-10-CM | POA: Insufficient documentation

## 2019-02-06 DIAGNOSIS — G479 Sleep disorder, unspecified: Secondary | ICD-10-CM | POA: Insufficient documentation

## 2019-02-06 DIAGNOSIS — Z885 Allergy status to narcotic agent status: Secondary | ICD-10-CM | POA: Insufficient documentation

## 2019-02-06 DIAGNOSIS — F13230 Sedative, hypnotic or anxiolytic dependence with withdrawal, uncomplicated: Secondary | ICD-10-CM | POA: Insufficient documentation

## 2019-02-06 DIAGNOSIS — F419 Anxiety disorder, unspecified: Secondary | ICD-10-CM

## 2019-02-06 DIAGNOSIS — Z6831 Body mass index (BMI) 31.0-31.9, adult: Secondary | ICD-10-CM

## 2019-02-06 DIAGNOSIS — I10 Essential (primary) hypertension: Secondary | ICD-10-CM

## 2019-02-06 DIAGNOSIS — M5416 Radiculopathy, lumbar region: Secondary | ICD-10-CM | POA: Insufficient documentation

## 2019-02-06 NOTE — Progress Notes (Signed)
[name]  [address]  [place] [id number]  Phone: [phone]    Encounter Date: 02/06/2019    Patient ID:  Elizabeth Page  UKG:[id number]    DOB: February 10, 1946  Age: 73 y.o. female    Subjective:     Chief Complaint   Patient presents with   . Follow Up     1 mo f/u      C/O NECK PAIN AND LOW BACK PAIN.  WANTS TO SEE DR HARGRAVES BUT NEEDS MRI FIRST.  BOTH OF THESE HAS BEEN CHRONIC.    The history is provided by the patient.     Current Outpatient Medications   Medication Sig   . ACCU-CHEK AVIVA CONTROL SOLN Solution    . ACCU-CHEK SOFTCLIX LANCETS Misc    . Amlodipine-Olmesartan 5-20 mg Oral Tablet Take 1 Tab by mouth Once a day   . B COMPLEX-VITAMIN B12 Oral Tablet    . clonazePAM (KLONOPIN) 0.5 mg Oral Tablet Take 1 Tab (0.5 mg total) by mouth Three times a day as needed   . DULoxetine (CYMBALTA DR) 60 mg Oral Capsule, Delayed Release(E.C.) Take 1 Cap (60 mg total) by mouth Once a day for 180 days TAKE 1 CAPSULE BY MOUTH ONCE A DAY   . famotidine (PEPCID) 40 mg Oral Tablet Take 1 Tab (40 mg total) by mouth Twice daily   . levothyroxine (SYNTHROID) 50 mcg Oral Tablet Take 1 Tab (50 mcg total) by mouth Every morning   . metformin (GLUCOPHAGE-XR) 750 mg Oral Tablet Sustained Release 24 hr Take 1 Tab (750 mg total) by mouth Twice daily   . mirtazapine (REMERON) 15 mg Oral Tablet Take 1 Tab (15 mg total) by mouth Every night for 90 days   . pantoprazole (PROTONIX) 40 mg Oral Tablet, Delayed Release (E.C.) Take 1 Tab (40 mg total) by mouth Once a day   . VITAMIN D 50,000 unit Oral Capsule TAKE ONE CAPSULE BY MOUTH ONCE WEEKLY     Allergies   Allergen Reactions   . Allergy-Decongestant [Dexbrompheniramine-Pseudoephed]    . Codeine    . Cortisone    . Motrin [Ibuprofen]      Past Medical History:   Diagnosis Date   . Anxiety    . Depression    . Diabetes mellitus, type 2 (CMS HCC)    . GERD (gastroesophageal reflux disease)    . History of concussion    . HTN (hypertension)    . Hyperlipidemia    . Stress    .  Ulcerative colitis (CMS HCC)          Past Surgical History:   Procedure Laterality Date   . HX CESAREAN SECTION     . HX HYSTERECTOMY     . ORIF ANKLE FRACTURE     . TOTAL ABDOMINAL HYSTERECTOMY           Family Medical History:     Problem Relation (Age of Onset)    Other Mother, Father            Social History     Tobacco Use   . Smoking status: Never Smoker   . Smokeless tobacco: Never Used   Substance Use Topics   . Alcohol use: No   . Drug use: Never       Review of Systems   Constitutional: Positive for fatigue.   HENT: Negative.    Eyes: Negative.    Respiratory: Negative.    Cardiovascular: Negative.  Gastrointestinal: Negative.    Endocrine: Negative.    Genitourinary: Negative.    Musculoskeletal: Positive for arthralgias, back pain and neck pain.   Skin: Negative.    Allergic/Immunologic: Negative.    Neurological: Negative.    Hematological: Negative.    Psychiatric/Behavioral: Positive for sleep disturbance. Negative for self-injury and suicidal ideas. The patient is nervous/anxious.      Objective:   Vitals: BP 130/68   Pulse (!) 107   Temp 36.4 C (97.6 F) (Oral)   Ht 1.575 m (5\' 2" )   Wt 78.9 kg (174 lb)   SpO2 98%   BMI 31.83 kg/m         Physical Exam  Vitals signs and nursing note reviewed.   Constitutional:       Appearance: She is well-developed.   HENT:      Head: Normocephalic and atraumatic.   Eyes:      Conjunctiva/sclera: Conjunctivae normal.      Pupils: Pupils are equal, round, and reactive to light.   Neck:      Musculoskeletal: Normal range of motion and neck supple.   Cardiovascular:      Rate and Rhythm: Normal rate and regular rhythm.      Heart sounds: Normal heart sounds. No murmur. No friction rub. No gallop.    Pulmonary:      Effort: Pulmonary effort is normal.      Breath sounds: Normal breath sounds.   Abdominal:      General: Bowel sounds are normal.      Palpations: Abdomen is soft.   Musculoskeletal:      Cervical back: She exhibits decreased range of motion and  tenderness.      Lumbar back: She exhibits decreased range of motion and tenderness.   Skin:     General: Skin is warm and dry.   Neurological:      Mental Status: She is alert and oriented to person, place, and time.      Deep Tendon Reflexes: Reflexes are normal and symmetric.   Psychiatric:         Behavior: Behavior normal.         Thought Content: Thought content normal.         Judgment: Judgment normal.         Assessment & Plan:     ENCOUNTER DIAGNOSES     ICD-10-CM   1. Type 2 diabetes mellitus with hyperglycemia, without long-term current use of insulin (CMS HCC) E11.65   2. Lumbar radiculopathy M54.16   3. Cervical radiculopathy M54.12   4. Mood disorder (CMS HCC) F39   5. Anxiety F41.9   6. Essential hypertension I10   7. GERD without esophagitis K21.9       Orders Placed This Encounter   . MRI Lumbosacral Spine WO   . MRI Cervical Spine WO       Return if symptoms worsen or fail to improve.    [name], PA-C

## 2019-03-02 ENCOUNTER — Encounter (HOSPITAL_BASED_OUTPATIENT_CLINIC_OR_DEPARTMENT_OTHER): Payer: Self-pay | Admitting: PSYCHIATRY

## 2019-03-09 ENCOUNTER — Ambulatory Visit: Payer: Commercial Managed Care - PPO | Attending: PHYSICIAN ASSISTANT | Admitting: PHYSICIAN ASSISTANT

## 2019-03-09 ENCOUNTER — Other Ambulatory Visit: Payer: Self-pay

## 2019-03-09 ENCOUNTER — Encounter (HOSPITAL_BASED_OUTPATIENT_CLINIC_OR_DEPARTMENT_OTHER): Payer: Self-pay | Admitting: PHYSICIAN ASSISTANT

## 2019-03-09 VITALS — BP 140/72 | HR 109 | Ht 62.0 in | Wt 174.0 lb

## 2019-03-09 DIAGNOSIS — F419 Anxiety disorder, unspecified: Secondary | ICD-10-CM | POA: Insufficient documentation

## 2019-03-09 DIAGNOSIS — I10 Essential (primary) hypertension: Secondary | ICD-10-CM | POA: Insufficient documentation

## 2019-03-09 DIAGNOSIS — Z79890 Hormone replacement therapy (postmenopausal): Secondary | ICD-10-CM | POA: Insufficient documentation

## 2019-03-09 DIAGNOSIS — Z885 Allergy status to narcotic agent status: Secondary | ICD-10-CM | POA: Insufficient documentation

## 2019-03-09 DIAGNOSIS — Z888 Allergy status to other drugs, medicaments and biological substances status: Secondary | ICD-10-CM | POA: Insufficient documentation

## 2019-03-09 DIAGNOSIS — Z886 Allergy status to analgesic agent status: Secondary | ICD-10-CM | POA: Insufficient documentation

## 2019-03-09 DIAGNOSIS — Z79899 Other long term (current) drug therapy: Secondary | ICD-10-CM | POA: Insufficient documentation

## 2019-03-09 DIAGNOSIS — E785 Hyperlipidemia, unspecified: Secondary | ICD-10-CM | POA: Insufficient documentation

## 2019-03-09 DIAGNOSIS — E119 Type 2 diabetes mellitus without complications: Secondary | ICD-10-CM | POA: Insufficient documentation

## 2019-03-09 DIAGNOSIS — L03115 Cellulitis of right lower limb: Secondary | ICD-10-CM | POA: Insufficient documentation

## 2019-03-09 DIAGNOSIS — G479 Sleep disorder, unspecified: Secondary | ICD-10-CM | POA: Insufficient documentation

## 2019-03-09 DIAGNOSIS — K219 Gastro-esophageal reflux disease without esophagitis: Secondary | ICD-10-CM | POA: Insufficient documentation

## 2019-03-09 DIAGNOSIS — F39 Unspecified mood [affective] disorder: Secondary | ICD-10-CM

## 2019-03-09 DIAGNOSIS — Z7984 Long term (current) use of oral hypoglycemic drugs: Secondary | ICD-10-CM | POA: Insufficient documentation

## 2019-03-09 MED ORDER — CEPHALEXIN 500 MG CAPSULE
500.00 mg | ORAL_CAPSULE | Freq: Three times a day (TID) | ORAL | 0 refills | Status: AC
Start: 2019-03-09 — End: 2019-03-19

## 2019-03-16 ENCOUNTER — Encounter (HOSPITAL_BASED_OUTPATIENT_CLINIC_OR_DEPARTMENT_OTHER): Payer: Self-pay | Admitting: PSYCHIATRY

## 2019-03-16 ENCOUNTER — Other Ambulatory Visit: Payer: Self-pay

## 2019-03-16 ENCOUNTER — Ambulatory Visit (HOSPITAL_BASED_OUTPATIENT_CLINIC_OR_DEPARTMENT_OTHER): Payer: Commercial Managed Care - PPO | Admitting: PSYCHIATRY

## 2019-03-16 VITALS — BP 128/72 | Resp 16 | Ht 62.0 in | Wt 180.0 lb

## 2019-03-16 DIAGNOSIS — F410 Panic disorder [episodic paroxysmal anxiety] without agoraphobia: Secondary | ICD-10-CM

## 2019-03-16 DIAGNOSIS — F3341 Major depressive disorder, recurrent, in partial remission: Secondary | ICD-10-CM

## 2019-03-16 DIAGNOSIS — F331 Major depressive disorder, recurrent, moderate: Secondary | ICD-10-CM

## 2019-03-16 MED ORDER — MIRTAZAPINE 15 MG TABLET
15.00 mg | ORAL_TABLET | Freq: Every evening | ORAL | 5 refills | Status: DC
Start: 2019-03-16 — End: 2019-09-12

## 2019-03-16 MED ORDER — CLONAZEPAM 0.5 MG TABLET
0.50 mg | ORAL_TABLET | Freq: Three times a day (TID) | ORAL | 5 refills | Status: DC | PRN
Start: 2019-03-16 — End: 2019-09-12

## 2019-03-16 MED ORDER — DULOXETINE 60 MG CAPSULE,DELAYED RELEASE
60.0000 mg | DELAYED_RELEASE_CAPSULE | Freq: Every day | ORAL | 5 refills | Status: DC
Start: 2019-03-16 — End: 2019-09-12

## 2019-03-16 NOTE — Progress Notes (Signed)
ADULT AND ADOLESCENT Elizabeth Page, Elizabeth Page  [address]  [place] [address]       Name: Elizabeth Page MRN:  [id number]   Date: 03/16/2019 Age: 73 y.o.       History:        Last visit:  01/18/2019    Interval History:  Overall she is doing well with better sleep and mood/outlook.  That is despite living in [place]" with loud, rough people.  Overall she is coping well.      Has an attorney to represent her against the apartment [employer]ud[employer].    Does cry at times and gets frustrated. Friend helps her with transportation.      Substance use: Denies any substance use.    Review of systems:   Constitutional: Denies any medication side effect.  GI: Denies any diarrhea, constipation.  Dermatological:  Denies any skin rash      Medical and Surgical history in the interim: Non-contributory.  Past Medical History:   Diagnosis Date   . Anxiety    . Depression    . Diabetes mellitus, type 2 (CMS HCC)    . GERD (gastroesophageal reflux disease)    . History of concussion    . HTN (hypertension)    . Hyperlipidemia    . Stress    . Ulcerative colitis (CMS HCC)            Past Surgical History:   Procedure Laterality Date   . HX CESAREAN SECTION     . HX HYSTERECTOMY     . ORIF ANKLE FRACTURE     . TOTAL ABDOMINAL HYSTERECTOMY             Current Outpatient Medications   Medication Sig Dispense Refill   . ACCU-CHEK AVIVA CONTROL SOLN Solution      . ACCU-CHEK SOFTCLIX LANCETS Misc      . Amlodipine-Olmesartan 5-20 mg Oral Tablet Take 1 Tab by mouth Once a day 90 Tab 3   . B COMPLEX-VITAMIN B12 Oral Tablet      . cephalexin (KEFLEX) 500 mg Oral Capsule Take 1 Cap (500 mg total) by mouth Three times a day for 10 days 30 Cap 0   . clonazePAM (KLONOPIN) 0.5 mg Oral Tablet Take 1 Tab (0.5 mg total) by mouth Three times a day as needed 90 Tab 2   . DULoxetine (CYMBALTA DR) 60 mg Oral Capsule, Delayed Release(E.C.) Take 1 Cap (60 mg total) by mouth Once a day for 180 days TAKE 1 CAPSULE BY MOUTH ONCE A DAY 30 Cap 5      . famotidine (PEPCID) 40 mg Oral Tablet Take 1 Tab (40 mg total) by mouth Twice daily 60 Tab 3   . levothyroxine (SYNTHROID) 50 mcg Oral Tablet Take 1 Tab (50 mcg total) by mouth Every morning 30 Tab 3   . metformin (GLUCOPHAGE-XR) 750 mg Oral Tablet Sustained Release 24 hr Take 1 Tab (750 mg total) by mouth Twice daily 60 Tab 3   . mirtazapine (REMERON) 15 mg Oral Tablet Take 1 Tab (15 mg total) by mouth Every night for 90 days 30 Tab 2   . pantoprazole (PROTONIX) 40 mg Oral Tablet, Delayed Release (E.C.) Take 1 Tab (40 mg total) by mouth Once a day 90 Tab 3   . VITAMIN D 50,000 unit Oral Capsule TAKE ONE CAPSULE BY MOUTH ONCE WEEKLY 12 Cap 0     No current facility-administered medications for this visit.  Allergies:   Allergies   Allergen Reactions   . Allergy-Decongestant [Dexbrompheniramine-Pseudoephed]    . Codeine    . Cortisone    . Motrin [Ibuprofen]            Exam:      There were no vitals taken for this visit.      General appearance: Well dressed, well groomed. Calm and pleasant, good eye contact.  Musculoskeletal: Normal gait and station. Normal muscle strength and tone.  Speech: Articulate, appropriate with spontaneous elaborations  Affect: Congruent with mood which is described as "OK".  Thought process: Logical, linear and goal directed.  Association: Intact  Abnormal thought: None and denies SI/HI.  Judgement and Insight: Good.  Attention and concentration: Good.  Language: Fluent use of English.        Assessment: Diagnosis: unchanged.   Overall she is stable.    Substance use is not a concern. No legal issues. No acute suicide risk. Discussed status and reviewed options for therapy. Discussed medications, psychotherapy and structures/schedule of program. Reviewed plan, goals, rationale, risks, benefits, and alternatives. Patient indicates understanding and agreement. Has decision making capacity.        ICD-10-CM    1. Recurrent major depressive disorder, in partial remission (CMS HCC)  F33.41    2. Panic disorder F41.0         Plan:   Medications: Continue current meds/Plan.  Therapy:  Continue current supportive work.  RTC: 3 months.  Labs/test/imagining: None  I spent over 30 minutes on this case today and the patient was present over 66% of that time and we discussed her issues, stressors, treatment plan, medications, potential medication side effects, I supported her and counseled her and obtained informed consent.        Latina CraverPaul Lyndy Russman, MD        This note was partially created using voice recognition software and is inherently subject to errors including those of syntax and "sound-alike" substitutions which may escape proofreading.  In such instances, original meaning may be extrapolated by contextual derivation.

## 2019-03-26 ENCOUNTER — Other Ambulatory Visit (HOSPITAL_COMMUNITY): Payer: Self-pay

## 2019-03-27 ENCOUNTER — Other Ambulatory Visit: Payer: Self-pay

## 2019-03-27 ENCOUNTER — Encounter (HOSPITAL_BASED_OUTPATIENT_CLINIC_OR_DEPARTMENT_OTHER): Payer: Self-pay | Admitting: PHYSICIAN ASSISTANT

## 2019-03-27 ENCOUNTER — Ambulatory Visit: Payer: Commercial Managed Care - PPO | Attending: PHYSICIAN ASSISTANT | Admitting: PHYSICIAN ASSISTANT

## 2019-03-27 VITALS — BP 128/68 | HR 114 | Temp 97.6°F | Ht 62.0 in | Wt 174.0 lb

## 2019-03-27 DIAGNOSIS — Z886 Allergy status to analgesic agent status: Secondary | ICD-10-CM | POA: Insufficient documentation

## 2019-03-27 DIAGNOSIS — Z79899 Other long term (current) drug therapy: Secondary | ICD-10-CM | POA: Insufficient documentation

## 2019-03-27 DIAGNOSIS — R0982 Postnasal drip: Secondary | ICD-10-CM | POA: Insufficient documentation

## 2019-03-27 DIAGNOSIS — R05 Cough: Secondary | ICD-10-CM | POA: Insufficient documentation

## 2019-03-27 DIAGNOSIS — E039 Hypothyroidism, unspecified: Secondary | ICD-10-CM | POA: Insufficient documentation

## 2019-03-27 DIAGNOSIS — I10 Essential (primary) hypertension: Secondary | ICD-10-CM

## 2019-03-27 DIAGNOSIS — F39 Unspecified mood [affective] disorder: Secondary | ICD-10-CM

## 2019-03-27 DIAGNOSIS — Z7984 Long term (current) use of oral hypoglycemic drugs: Secondary | ICD-10-CM | POA: Insufficient documentation

## 2019-03-27 DIAGNOSIS — E119 Type 2 diabetes mellitus without complications: Secondary | ICD-10-CM

## 2019-03-27 DIAGNOSIS — E785 Hyperlipidemia, unspecified: Secondary | ICD-10-CM | POA: Insufficient documentation

## 2019-03-27 DIAGNOSIS — K219 Gastro-esophageal reflux disease without esophagitis: Secondary | ICD-10-CM | POA: Insufficient documentation

## 2019-03-27 DIAGNOSIS — F419 Anxiety disorder, unspecified: Secondary | ICD-10-CM | POA: Insufficient documentation

## 2019-03-27 DIAGNOSIS — Z885 Allergy status to narcotic agent status: Secondary | ICD-10-CM | POA: Insufficient documentation

## 2019-03-27 DIAGNOSIS — Z79890 Hormone replacement therapy (postmenopausal): Secondary | ICD-10-CM | POA: Insufficient documentation

## 2019-03-27 MED ORDER — PANTOPRAZOLE 40 MG TABLET,DELAYED RELEASE
40.00 mg | DELAYED_RELEASE_TABLET | Freq: Two times a day (BID) | ORAL | 3 refills | Status: DC
Start: 2019-03-27 — End: 2020-04-04

## 2019-03-27 NOTE — Progress Notes (Signed)
[facility]  [address]  [place] [id number]  Phone: [phone]    Encounter Date: 03/27/2019    Patient ID:  Elizabeth DagoSusan A Detore  EXB:[id number]    DOB: 04/12/1946  Age: 73 y.o. female    Subjective:     Chief Complaint   Patient presents with   . Cough     cough,      C/O DRYNESS IN MOUTH AND COUGH AT TIMES.  DENIES FEVER OR SOB.  C/O HEARTBURN THAT HAS BEEN WORSE ESPECIALLY WHEN HUNGRY.    The history is provided by the patient.     Current Outpatient Medications   Medication Sig   . ACCU-CHEK AVIVA CONTROL SOLN Solution    . ACCU-CHEK SOFTCLIX LANCETS Misc    . Amlodipine-Olmesartan 5-20 mg Oral Tablet Take 1 Tab by mouth Once a day   . B COMPLEX-VITAMIN B12 Oral Tablet    . clonazePAM (KLONOPIN) 0.5 mg Oral Tablet Take 1 Tab (0.5 mg total) by mouth Three times a day as needed for up to 180 days   . DULoxetine (CYMBALTA DR) 60 mg Oral Capsule, Delayed Release(E.C.) Take 1 Cap (60 mg total) by mouth Once a day for 180 days TAKE 1 CAPSULE BY MOUTH ONCE A DAY   . famotidine (PEPCID) 40 mg Oral Tablet Take 1 Tab (40 mg total) by mouth Twice daily   . levothyroxine (SYNTHROID) 50 mcg Oral Tablet Take 1 Tab (50 mcg total) by mouth Every morning   . metformin (GLUCOPHAGE-XR) 750 mg Oral Tablet Sustained Release 24 hr Take 1 Tab (750 mg total) by mouth Twice daily   . mirtazapine (REMERON) 15 mg Oral Tablet Take 1 Tab (15 mg total) by mouth Every night for 180 days   . pantoprazole (PROTONIX) 40 mg Oral Tablet, Delayed Release (E.C.) Take 1 Tab (40 mg total) by mouth Twice daily   . VITAMIN D 50,000 unit Oral Capsule TAKE ONE CAPSULE BY MOUTH ONCE WEEKLY     Allergies   Allergen Reactions   . Allergy-Decongestant [Dexbrompheniramine-Pseudoephed]    . Codeine    . Cortisone    . Motrin [Ibuprofen]      Past Medical History:   Diagnosis Date   . Anxiety    . Depression    . Diabetes mellitus, type 2 (CMS HCC)    . GERD (gastroesophageal reflux disease)    . History of concussion    . HTN (hypertension)    .  Hyperlipidemia    . Stress    . Ulcerative colitis (CMS HCC)          Past Surgical History:   Procedure Laterality Date   . HX CESAREAN SECTION     . HX HYSTERECTOMY     . ORIF ANKLE FRACTURE     . TOTAL ABDOMINAL HYSTERECTOMY           Family Medical History:     Problem Relation (Age of Onset)    Other Mother, Father            Social History     Tobacco Use   . Smoking status: Never Smoker   . Smokeless tobacco: Never Used   Substance Use Topics   . Alcohol use: No   . Drug use: Never       Review of Systems   Constitutional: Positive for fatigue.   HENT: Positive for postnasal drip.    Eyes: Negative.    Respiratory: Positive for cough. Negative  for shortness of breath, wheezing and stridor.    Cardiovascular: Negative.    Gastrointestinal: Negative.    Endocrine: Negative.    Genitourinary: Negative.    Musculoskeletal: Positive for arthralgias.   Skin: Negative.    Allergic/Immunologic: Negative.    Neurological: Negative.    Hematological: Negative.    Psychiatric/Behavioral: Negative for self-injury, sleep disturbance and suicidal ideas. The patient is nervous/anxious.      Objective:   Vitals: BP 128/68   Pulse (!) 114   Temp 36.4 C (97.6 F) (Oral)   Ht 1.575 m (5\' 2" )   Wt 78.9 kg (174 lb)   SpO2 98%   BMI 31.83 kg/m         Physical Exam  Vitals signs and nursing note reviewed.   Constitutional:       Appearance: She is well-developed.   HENT:      Head: Normocephalic and atraumatic.   Eyes:      Conjunctiva/sclera: Conjunctivae normal.      Pupils: Pupils are equal, round, and reactive to light.   Neck:      Musculoskeletal: Normal range of motion and neck supple.   Cardiovascular:      Rate and Rhythm: Normal rate and regular rhythm.      Heart sounds: Normal heart sounds. No murmur. No friction rub. No gallop.    Pulmonary:      Effort: Pulmonary effort is normal.      Breath sounds: Normal breath sounds.   Abdominal:      General: Bowel sounds are normal.      Palpations: Abdomen is soft.      Musculoskeletal: Normal range of motion.   Skin:     General: Skin is warm and dry.   Neurological:      Mental Status: She is alert and oriented to person, place, and time.      Deep Tendon Reflexes: Reflexes are normal and symmetric.   Psychiatric:         Behavior: Behavior normal.         Thought Content: Thought content normal.         Judgment: Judgment normal.         Assessment & Plan:     ENCOUNTER DIAGNOSES     ICD-10-CM   1. GERD without esophagitis K21.9   2. Essential hypertension I10   3. Type 2 diabetes mellitus without complication, without long-term current use of insulin (CMS HCC) E11.9   4. Anxiety F41.9   5. Mood disorder (CMS HCC) F39   6. Acquired hypothyroidism E03.9       Orders Placed This Encounter   . TSH w/ Free T4 Reflex   . pantoprazole (PROTONIX) 40 mg Oral Tablet, Delayed Release (E.C.)   DEFERS REFERRAL TO ENT OR GI.    Return if symptoms worsen or fail to improve.    [name], PA-C

## 2019-04-03 ENCOUNTER — Encounter (HOSPITAL_BASED_OUTPATIENT_CLINIC_OR_DEPARTMENT_OTHER): Payer: Self-pay | Admitting: PHYSICIAN ASSISTANT

## 2019-04-03 DIAGNOSIS — L03115 Cellulitis of right lower limb: Secondary | ICD-10-CM | POA: Insufficient documentation

## 2019-04-03 NOTE — Progress Notes (Signed)
[name]  [address]  [place] [id number]  Phone: [phone]    Encounter Date: 03/09/2019    Patient ID:  Denyse DagoSusan A Friar  GEX:[id number]    DOB: [birth year]  Age: 73 y.o. female    Subjective:     Chief Complaint   Patient presents with   . Leg Pain     leg pain. red hit it off a chair recliner, on Tuesday      hit leg about 4 days ago and now area is red and sore.    The history is provided by the patient.     Current Outpatient Medications   Medication Sig   . ACCU-CHEK AVIVA CONTROL SOLN Solution    . ACCU-CHEK SOFTCLIX LANCETS Misc    . Amlodipine-Olmesartan 5-20 mg Oral Tablet Take 1 Tab by mouth Once a day   . B COMPLEX-VITAMIN B12 Oral Tablet    . clonazePAM (KLONOPIN) 0.5 mg Oral Tablet Take 1 Tab (0.5 mg total) by mouth Three times a day as needed for up to 180 days   . DULoxetine (CYMBALTA DR) 60 mg Oral Capsule, Delayed Release(E.C.) Take 1 Cap (60 mg total) by mouth Once a day for 180 days TAKE 1 CAPSULE BY MOUTH ONCE A DAY   . famotidine (PEPCID) 40 mg Oral Tablet Take 1 Tab (40 mg total) by mouth Twice daily   . levothyroxine (SYNTHROID) 50 mcg Oral Tablet Take 1 Tab (50 mcg total) by mouth Every morning   . metformin (GLUCOPHAGE-XR) 750 mg Oral Tablet Sustained Release 24 hr Take 1 Tab (750 mg total) by mouth Twice daily   . mirtazapine (REMERON) 15 mg Oral Tablet Take 1 Tab (15 mg total) by mouth Every night for 180 days   . pantoprazole (PROTONIX) 40 mg Oral Tablet, Delayed Release (E.C.) Take 1 Tab (40 mg total) by mouth Twice daily   . VITAMIN D 50,000 unit Oral Capsule TAKE ONE CAPSULE BY MOUTH ONCE WEEKLY     Allergies   Allergen Reactions   . Allergy-Decongestant [Dexbrompheniramine-Pseudoephed]    . Codeine    . Cortisone    . Motrin [Ibuprofen]      Past Medical History:   Diagnosis Date   . Anxiety    . Depression    . Diabetes mellitus, type 2 (CMS HCC)    . GERD (gastroesophageal reflux disease)    . History of concussion    . HTN (hypertension)    . Hyperlipidemia    . Stress     . Ulcerative colitis (CMS HCC)          Past Surgical History:   Procedure Laterality Date   . HX CESAREAN SECTION     . HX HYSTERECTOMY     . ORIF ANKLE FRACTURE     . TOTAL ABDOMINAL HYSTERECTOMY           Family Medical History:     Problem Relation (Age of Onset)    Other Mother, Father            Social History     Tobacco Use   . Smoking status: Never Smoker   . Smokeless tobacco: Never Used   Substance Use Topics   . Alcohol use: No   . Drug use: Never       Review of Systems   Constitutional: Negative.    HENT: Negative.    Eyes: Negative.    Respiratory: Negative.    Cardiovascular: Negative.  Gastrointestinal: Negative.    Endocrine: Negative.    Genitourinary: Negative.    Musculoskeletal: Positive for arthralgias.   Skin: Positive for wound.   Allergic/Immunologic: Negative.    Neurological: Negative.    Hematological: Negative.    Psychiatric/Behavioral: Positive for sleep disturbance. Negative for self-injury and suicidal ideas. The patient is nervous/anxious.      Objective:   Vitals: BP 140/72   Pulse (!) 109   Ht 1.575 m (5\' 2" )   Wt 78.9 kg (174 lb)   SpO2 97%   BMI 31.83 kg/m         Physical Exam  Vitals signs and nursing note reviewed.   Constitutional:       Appearance: She is well-developed.   HENT:      Head: Normocephalic and atraumatic.   Eyes:      Conjunctiva/sclera: Conjunctivae normal.      Pupils: Pupils are equal, round, and reactive to light.   Neck:      Musculoskeletal: Normal range of motion and neck supple.   Cardiovascular:      Rate and Rhythm: Normal rate and regular rhythm.      Heart sounds: Normal heart sounds. No murmur. No friction rub. No gallop.    Pulmonary:      Effort: Pulmonary effort is normal.      Breath sounds: Normal breath sounds.   Abdominal:      General: Bowel sounds are normal.      Palpations: Abdomen is soft.   Musculoskeletal: Normal range of motion.   Skin:     General: Skin is warm and dry.          Neurological:      Mental Status: She is  alert and oriented to person, place, and time.      Deep Tendon Reflexes: Reflexes are normal and symmetric.   Psychiatric:         Behavior: Behavior normal.         Thought Content: Thought content normal.         Judgment: Judgment normal.         Assessment & Plan:     ENCOUNTER DIAGNOSES     ICD-10-CM   1. Cellulitis of right lower extremity L03.115   2. Essential hypertension I10   3. GERD without esophagitis K21.9   4. Type 2 diabetes mellitus without complication, without long-term current use of insulin (CMS HCC) E11.9   5. Mood disorder (CMS HCC) F39   6. Anxiety F41.9       Orders Placed This Encounter   . cephalexin (KEFLEX) 500 mg Oral Capsule       Return if symptoms worsen or fail to improve.    [name], PA-C

## 2019-05-16 ENCOUNTER — Other Ambulatory Visit (HOSPITAL_BASED_OUTPATIENT_CLINIC_OR_DEPARTMENT_OTHER): Payer: Self-pay | Admitting: PHYSICIAN ASSISTANT

## 2019-05-16 DIAGNOSIS — E119 Type 2 diabetes mellitus without complications: Secondary | ICD-10-CM

## 2019-05-16 DIAGNOSIS — E039 Hypothyroidism, unspecified: Secondary | ICD-10-CM

## 2019-05-16 MED ORDER — METFORMIN ER 750 MG TABLET,EXTENDED RELEASE 24 HR
750.00 mg | ORAL_TABLET | Freq: Two times a day (BID) | ORAL | 3 refills | Status: DC
Start: 2019-05-16 — End: 2019-09-07

## 2019-05-16 MED ORDER — LEVOTHYROXINE 50 MCG TABLET
50.00 ug | ORAL_TABLET | Freq: Every morning | ORAL | 3 refills | Status: DC
Start: 2019-05-16 — End: 2019-09-06

## 2019-06-19 ENCOUNTER — Other Ambulatory Visit (HOSPITAL_BASED_OUTPATIENT_CLINIC_OR_DEPARTMENT_OTHER): Payer: Self-pay | Admitting: PHYSICIAN ASSISTANT

## 2019-06-19 MED ORDER — AZITHROMYCIN 250 MG TABLET
ORAL_TABLET | ORAL | 0 refills | Status: DC
Start: 2019-06-19 — End: 2019-09-12

## 2019-06-20 ENCOUNTER — Other Ambulatory Visit (HOSPITAL_BASED_OUTPATIENT_CLINIC_OR_DEPARTMENT_OTHER): Payer: Self-pay | Admitting: PHYSICIAN ASSISTANT

## 2019-06-20 MED ORDER — AMLODIPINE 5 MG-OLMESARTAN 20 MG TABLET
1.0000 | ORAL_TABLET | Freq: Every day | ORAL | 3 refills | Status: DC
Start: 2019-06-20 — End: 2020-03-04

## 2019-07-13 ENCOUNTER — Ambulatory Visit: Payer: Commercial Managed Care - PPO | Attending: PHYSICIAN ASSISTANT | Admitting: PHYSICIAN ASSISTANT

## 2019-07-13 ENCOUNTER — Encounter (HOSPITAL_BASED_OUTPATIENT_CLINIC_OR_DEPARTMENT_OTHER): Payer: Self-pay | Admitting: PHYSICIAN ASSISTANT

## 2019-07-13 ENCOUNTER — Other Ambulatory Visit: Payer: Self-pay

## 2019-07-13 VITALS — BP 128/74 | HR 107 | Ht 62.0 in | Wt 178.0 lb

## 2019-07-13 DIAGNOSIS — I10 Essential (primary) hypertension: Secondary | ICD-10-CM | POA: Insufficient documentation

## 2019-07-13 DIAGNOSIS — F419 Anxiety disorder, unspecified: Secondary | ICD-10-CM | POA: Insufficient documentation

## 2019-07-13 DIAGNOSIS — F39 Unspecified mood [affective] disorder: Secondary | ICD-10-CM | POA: Insufficient documentation

## 2019-07-13 DIAGNOSIS — Z886 Allergy status to analgesic agent status: Secondary | ICD-10-CM | POA: Insufficient documentation

## 2019-07-13 DIAGNOSIS — Z7984 Long term (current) use of oral hypoglycemic drugs: Secondary | ICD-10-CM | POA: Insufficient documentation

## 2019-07-13 DIAGNOSIS — R61 Generalized hyperhidrosis: Secondary | ICD-10-CM | POA: Insufficient documentation

## 2019-07-13 DIAGNOSIS — Z79890 Hormone replacement therapy (postmenopausal): Secondary | ICD-10-CM | POA: Insufficient documentation

## 2019-07-13 DIAGNOSIS — K219 Gastro-esophageal reflux disease without esophagitis: Secondary | ICD-10-CM

## 2019-07-13 DIAGNOSIS — M5412 Radiculopathy, cervical region: Secondary | ICD-10-CM | POA: Insufficient documentation

## 2019-07-13 DIAGNOSIS — E119 Type 2 diabetes mellitus without complications: Secondary | ICD-10-CM | POA: Insufficient documentation

## 2019-07-13 DIAGNOSIS — M5416 Radiculopathy, lumbar region: Secondary | ICD-10-CM | POA: Insufficient documentation

## 2019-07-13 DIAGNOSIS — E785 Hyperlipidemia, unspecified: Secondary | ICD-10-CM | POA: Insufficient documentation

## 2019-07-13 DIAGNOSIS — Z885 Allergy status to narcotic agent status: Secondary | ICD-10-CM | POA: Insufficient documentation

## 2019-07-13 DIAGNOSIS — Z79899 Other long term (current) drug therapy: Secondary | ICD-10-CM | POA: Insufficient documentation

## 2019-07-13 MED ORDER — NYSTATIN 100,000 UNIT/GRAM TOPICAL POWDER
CUTANEOUS | 2 refills | Status: DC
Start: 2019-07-13 — End: 2019-09-12

## 2019-07-13 NOTE — Nursing Note (Signed)
07/13/19 1500   B12 Injection flowsheet   Medication Vitamin B12   Dose (mcg) 1,000 mcg   Site Left Gluteus   Route IM   Lot # [id number]   Expiration date 09/17/20   Manufacturer [employer]   [employer] # [id number]   Initials vd   Who supplied B12 Clinic supplied

## 2019-07-17 ENCOUNTER — Encounter (HOSPITAL_BASED_OUTPATIENT_CLINIC_OR_DEPARTMENT_OTHER): Payer: Self-pay | Admitting: PSYCHIATRY

## 2019-07-19 ENCOUNTER — Other Ambulatory Visit (HOSPITAL_COMMUNITY): Payer: Self-pay

## 2019-07-23 ENCOUNTER — Other Ambulatory Visit (HOSPITAL_COMMUNITY): Payer: Self-pay

## 2019-07-25 ENCOUNTER — Other Ambulatory Visit (HOSPITAL_BASED_OUTPATIENT_CLINIC_OR_DEPARTMENT_OTHER): Payer: Self-pay | Admitting: PSYCHIATRY

## 2019-07-25 DIAGNOSIS — F331 Major depressive disorder, recurrent, moderate: Secondary | ICD-10-CM

## 2019-07-25 NOTE — Telephone Encounter (Signed)
Script was sent in on 03/16/2019 with 5 refills.  Patient should still have one left

## 2019-08-03 ENCOUNTER — Other Ambulatory Visit (HOSPITAL_BASED_OUTPATIENT_CLINIC_OR_DEPARTMENT_OTHER): Payer: Self-pay | Admitting: PHYSICIAN ASSISTANT

## 2019-08-03 DIAGNOSIS — J45909 Unspecified asthma, uncomplicated: Secondary | ICD-10-CM

## 2019-08-03 MED ORDER — METHYLPREDNISOLONE 4 MG TABLETS IN A DOSE PACK
ORAL_TABLET | ORAL | 0 refills | Status: DC
Start: 2019-08-03 — End: 2019-09-12

## 2019-08-03 MED ORDER — ALBUTEROL SULFATE 2.5 MG/3 ML (0.083 %) SOLUTION FOR NEBULIZATION
2.50 mg | INHALATION_SOLUTION | Freq: Four times a day (QID) | RESPIRATORY_TRACT | 1 refills | Status: DC | PRN
Start: 2019-08-03 — End: 2019-08-03

## 2019-08-03 MED ORDER — ALBUTEROL SULFATE HFA 90 MCG/ACTUATION AEROSOL INHALER
1.0000 | INHALATION_SPRAY | Freq: Four times a day (QID) | RESPIRATORY_TRACT | 1 refills | Status: DC | PRN
Start: 2019-08-03 — End: 2019-09-12

## 2019-08-15 ENCOUNTER — Other Ambulatory Visit (HOSPITAL_BASED_OUTPATIENT_CLINIC_OR_DEPARTMENT_OTHER): Payer: Self-pay | Admitting: PHYSICIAN ASSISTANT

## 2019-08-15 ENCOUNTER — Telehealth (HOSPITAL_BASED_OUTPATIENT_CLINIC_OR_DEPARTMENT_OTHER): Payer: Self-pay | Admitting: PHYSICIAN ASSISTANT

## 2019-08-15 NOTE — Nursing Note (Signed)
[facility] called, wanted to know what was the best way to talk to [facility] about maybe starting Shantanique on a low dose statin, due to her DM and medicare is requiring them to find out if it is a  medication that patient could start to reduce her risk of a stroke   recommending Crestor 5 or Lipitor 10   or can be discussed at next appointment

## 2019-08-15 NOTE — Telephone Encounter (Signed)
She HAS BEEN UNABLE TO TOLERATE STATINS IN THE PAST.  WILL TALK WITH HER AT NEXT VISIT.  [name], PA-C  08/15/2019, 13:41

## 2019-09-06 ENCOUNTER — Other Ambulatory Visit (HOSPITAL_BASED_OUTPATIENT_CLINIC_OR_DEPARTMENT_OTHER): Payer: Self-pay | Admitting: PHYSICIAN ASSISTANT

## 2019-09-06 DIAGNOSIS — E039 Hypothyroidism, unspecified: Secondary | ICD-10-CM

## 2019-09-07 ENCOUNTER — Other Ambulatory Visit (HOSPITAL_BASED_OUTPATIENT_CLINIC_OR_DEPARTMENT_OTHER): Payer: Self-pay | Admitting: PHYSICIAN ASSISTANT

## 2019-09-07 DIAGNOSIS — E039 Hypothyroidism, unspecified: Secondary | ICD-10-CM

## 2019-09-07 DIAGNOSIS — E119 Type 2 diabetes mellitus without complications: Secondary | ICD-10-CM

## 2019-09-08 ENCOUNTER — Encounter (HOSPITAL_BASED_OUTPATIENT_CLINIC_OR_DEPARTMENT_OTHER): Payer: Self-pay | Admitting: PHYSICIAN ASSISTANT

## 2019-09-08 NOTE — Progress Notes (Signed)
[facility]  [address]  [place] [id number]  Phone: [phone]    Encounter Date: 07/13/2019    Patient ID:  Elizabeth DagoSusan A Page  [id number]    DOB: [birth year]  Age: 73 y.o. female    Subjective:     Chief Complaint   Patient presents with   . Needs Order     mri for back and neck      C/O NECK PAIN THAT HAS BEEN CHRONIC BUT WORSENING.  C/O SWEATING.  C/O LOW BACK PAIN THAT HAS ALSO BEEN CHRONIC.    The history is provided by the patient.     Current Outpatient Medications   Medication Sig   . ACCU-CHEK AVIVA CONTROL SOLN Solution    . ACCU-CHEK SOFTCLIX LANCETS Misc    . albuterol sulfate (PROAIR HFA) 90 mcg/actuation Inhalation HFA Aerosol Inhaler Take 1-2 Puffs by inhalation Every 6 hours as needed   . Amlodipine-Olmesartan 5-20 mg Oral Tablet Take 1 Tab by mouth Once a day   . azithromycin (ZITHROMAX) 250 mg Oral Tablet Take 500 mg (2 tab) on day 1; take 250 mg (1 tab) on days 2-5.   . B COMPLEX-VITAMIN B12 Oral Tablet    . clonazePAM (KLONOPIN) 0.5 mg Oral Tablet Take 1 Tab (0.5 mg total) by mouth Three times a day as needed for up to 180 days   . DULoxetine (CYMBALTA DR) 60 mg Oral Capsule, Delayed Release(E.C.) Take 1 Cap (60 mg total) by mouth Once a day for 180 days TAKE 1 CAPSULE BY MOUTH ONCE A DAY   . famotidine (PEPCID) 40 mg Oral Tablet Take 1 Tab (40 mg total) by mouth Twice daily   . levothyroxine (SYNTHROID) 50 mcg Oral Tablet TAKE 1 TABLET BY MOUTH EVERY MORNING   . metformin (GLUCOPHAGE-XR) 750 mg Oral Tablet Sustained Release 24 hr TAKE 1 TABLET BY MOUTH TWICE A DAY   . Methylprednisolone (MEDROL DOSEPACK) 4 mg Oral Tablets, Dose Pack Take as instructed.   . mirtazapine (REMERON) 15 mg Oral Tablet Take 1 Tab (15 mg total) by mouth Every night for 180 days   . nystatin (NYSTOP) 100,000 unit/gram Powder use tid as dir   . pantoprazole (PROTONIX) 40 mg Oral Tablet, Delayed Release (E.C.) Take 1 Tab (40 mg total) by mouth Twice daily   . VITAMIN D 50,000 unit Oral  Capsule TAKE ONE CAPSULE BY MOUTH ONCE WEEKLY     Allergies   Allergen Reactions   . Allergy-Decongestant [Dexbrompheniramine-Pseudoephed]    . Codeine    . Cortisone    . Motrin [Ibuprofen]      Past Medical History:   Diagnosis Date   . Anxiety    . Depression    . Diabetes mellitus, type 2 (CMS HCC)    . GERD (gastroesophageal reflux disease)    . History of concussion    . HTN (hypertension)    . Hyperlipidemia    . Stress    . Ulcerative colitis (CMS HCC)          Past Surgical History:   Procedure Laterality Date   . HX CESAREAN SECTION     . HX HYSTERECTOMY     . ORIF ANKLE FRACTURE     . TOTAL ABDOMINAL HYSTERECTOMY           Family Medical History:     Problem Relation (Age of Onset)    Other Mother, Father  Social History     Tobacco Use   . Smoking status: Never Smoker   . Smokeless tobacco: Never Used   Substance Use Topics   . Alcohol use: No   . Drug use: Never       Review of Systems   Constitutional: Positive for fatigue.   HENT: Negative.    Eyes: Negative.    Respiratory: Negative.    Cardiovascular: Negative.    Gastrointestinal: Negative.    Endocrine: Negative.    Genitourinary: Negative.    Musculoskeletal: Positive for arthralgias, back pain and neck pain.   Skin: Negative.    Allergic/Immunologic: Negative.    Neurological: Negative.    Hematological: Negative.    Psychiatric/Behavioral: Positive for sleep disturbance. Negative for self-injury and suicidal ideas. The patient is nervous/anxious.      Objective:   Vitals: BP 128/74   Pulse (!) 107   Ht 1.575 m (5\' 2" )   Wt 80.7 kg (178 lb)   SpO2 95%   BMI 32.56 kg/m         Physical Exam  Vitals signs and nursing note reviewed.   Constitutional:       Appearance: She is well-developed.   HENT:      Head: Normocephalic and atraumatic.   Eyes:      Conjunctiva/sclera: Conjunctivae normal.      Pupils: Pupils are equal, round, and reactive to light.   Neck:      Musculoskeletal: Normal range of motion and neck supple.      Cardiovascular:      Rate and Rhythm: Normal rate and regular rhythm.      Heart sounds: Normal heart sounds. No murmur. No friction rub. No gallop.    Pulmonary:      Effort: Pulmonary effort is normal.      Breath sounds: Normal breath sounds.   Abdominal:      General: Bowel sounds are normal.      Palpations: Abdomen is soft.   Musculoskeletal:      Cervical back: She exhibits decreased range of motion and tenderness.      Lumbar back: She exhibits decreased range of motion and tenderness.   Skin:     General: Skin is warm and dry.   Neurological:      Mental Status: She is alert and oriented to person, place, and time.      Deep Tendon Reflexes: Reflexes are normal and symmetric.   Psychiatric:         Behavior: Behavior normal.         Thought Content: Thought content normal.         Judgment: Judgment normal.         Assessment & Plan:     ENCOUNTER DIAGNOSES     ICD-10-CM   1. Essential hypertension  I10   2. GERD without esophagitis  K21.9   3. Type 2 diabetes mellitus without complication, without long-term current use of insulin (CMS HCC)  E11.9   4. Anxiety  F41.9   5. Mood disorder (CMS HCC)  F39   6. Sweat, sweating, excessive  R61   7. Cervical radiculopathy  M54.12   8. Lumbar radiculopathy  M54.16       Orders Placed This Encounter   . MRI Lumbosacral Spine WO   . MRI Cervical Spine WO   . CBC/DIFF   . Comp Metabolic Panel-Non Fasting   . TSH w/ Free T4 Reflex   . Hemoglobin A1C   .  nystatin (NYSTOP) 100,000 unit/gram Powder   SWEATING IS LIKELY  FROM CYMBALTA.  She FOLLOWS WITH PSYCH.    Return if symptoms worsen or fail to improve.    [name], PA-C

## 2019-09-11 ENCOUNTER — Other Ambulatory Visit: Payer: Self-pay

## 2019-09-11 ENCOUNTER — Encounter (HOSPITAL_BASED_OUTPATIENT_CLINIC_OR_DEPARTMENT_OTHER): Payer: Self-pay | Admitting: PHYSICIAN ASSISTANT

## 2019-09-11 ENCOUNTER — Ambulatory Visit: Payer: Commercial Managed Care - PPO | Attending: PHYSICIAN ASSISTANT | Admitting: PHYSICIAN ASSISTANT

## 2019-09-11 VITALS — BP 130/74 | HR 116 | Ht 62.0 in | Wt 173.0 lb

## 2019-09-11 DIAGNOSIS — K219 Gastro-esophageal reflux disease without esophagitis: Secondary | ICD-10-CM | POA: Insufficient documentation

## 2019-09-11 DIAGNOSIS — E119 Type 2 diabetes mellitus without complications: Secondary | ICD-10-CM | POA: Insufficient documentation

## 2019-09-11 DIAGNOSIS — F39 Unspecified mood [affective] disorder: Secondary | ICD-10-CM | POA: Insufficient documentation

## 2019-09-11 DIAGNOSIS — E785 Hyperlipidemia, unspecified: Secondary | ICD-10-CM | POA: Insufficient documentation

## 2019-09-11 DIAGNOSIS — M6250 Muscle wasting and atrophy, not elsewhere classified, unspecified site: Secondary | ICD-10-CM

## 2019-09-11 DIAGNOSIS — Z7984 Long term (current) use of oral hypoglycemic drugs: Secondary | ICD-10-CM | POA: Insufficient documentation

## 2019-09-11 DIAGNOSIS — Z79899 Other long term (current) drug therapy: Secondary | ICD-10-CM | POA: Insufficient documentation

## 2019-09-11 DIAGNOSIS — I10 Essential (primary) hypertension: Secondary | ICD-10-CM | POA: Insufficient documentation

## 2019-09-11 DIAGNOSIS — F419 Anxiety disorder, unspecified: Secondary | ICD-10-CM | POA: Insufficient documentation

## 2019-09-11 DIAGNOSIS — Z7689 Persons encountering health services in other specified circumstances: Secondary | ICD-10-CM | POA: Insufficient documentation

## 2019-09-11 DIAGNOSIS — Z886 Allergy status to analgesic agent status: Secondary | ICD-10-CM | POA: Insufficient documentation

## 2019-09-11 DIAGNOSIS — Z885 Allergy status to narcotic agent status: Secondary | ICD-10-CM | POA: Insufficient documentation

## 2019-09-11 NOTE — Nursing Note (Signed)
09/11/19 1200   B12 Injection flowsheet   Medication Vitamin B12   Dose (mcg) 1,000 mcg   Site Right Gluteus   Route IM   Lot # [id number]   Expiration date 01/16/21   Manufacturer [employer]   [place] # [id number]   [id number] vd   Who supplied B12 Clinic supplied

## 2019-09-11 NOTE — Progress Notes (Signed)
[facility]  [address]  [place] [address]  Phone: [phone]    Encounter Date: 09/11/2019    Patient ID:  [facility]  LKT:[id number]    DOB: Sep 09, 1946  Age: 73 y.o. female    Subjective:     Chief Complaint   Patient presents with   . Follow Up     f/u check and b12     here for a follow up.  can not afford mri at this time.  wants to start PT.    The history is provided by the patient.     Current Outpatient Medications   Medication Sig   . ACCU-CHEK AVIVA CONTROL SOLN Solution    . ACCU-CHEK SOFTCLIX LANCETS Misc    . albuterol sulfate (PROAIR HFA) 90 mcg/actuation Inhalation HFA Aerosol Inhaler Take 1-2 Puffs by inhalation Every 6 hours as needed   . Amlodipine-Olmesartan 5-20 mg Oral Tablet Take 1 Tab by mouth Once a day   . azithromycin (ZITHROMAX) 250 mg Oral Tablet Take 500 mg (2 tab) on day 1; take 250 mg (1 tab) on days 2-5.   . B COMPLEX-VITAMIN B12 Oral Tablet    . clonazePAM (KLONOPIN) 0.5 mg Oral Tablet Take 1 Tab (0.5 mg total) by mouth Three times a day as needed for up to 180 days   . DULoxetine (CYMBALTA DR) 60 mg Oral Capsule, Delayed Release(E.C.) Take 1 Cap (60 mg total) by mouth Once a day for 180 days TAKE 1 CAPSULE BY MOUTH ONCE A DAY   . famotidine (PEPCID) 40 mg Oral Tablet Take 1 Tab (40 mg total) by mouth Twice daily   . levothyroxine (SYNTHROID) 50 mcg Oral Tablet TAKE 1 TABLET BY MOUTH EVERY MORNING   . metformin (GLUCOPHAGE-XR) 750 mg Oral Tablet Sustained Release 24 hr TAKE 1 TABLET BY MOUTH TWICE A DAY   . Methylprednisolone (MEDROL DOSEPACK) 4 mg Oral Tablets, Dose Pack Take as instructed.   . mirtazapine (REMERON) 15 mg Oral Tablet Take 1 Tab (15 mg total) by mouth Every night for 180 days   . nystatin (NYSTOP) 100,000 unit/gram Powder use tid as dir   . pantoprazole (PROTONIX) 40 mg Oral Tablet, Delayed Release (E.C.) Take 1 Tab (40 mg total) by mouth Twice daily   . VITAMIN D 50,000 unit Oral Capsule TAKE ONE CAPSULE BY MOUTH ONCE WEEKLY        Allergies   Allergen Reactions   . Allergy-Decongestant [Dexbrompheniramine-Pseudoephed]    . Codeine    . Cortisone    . Motrin [Ibuprofen]      Past Medical History:   Diagnosis Date   . Anxiety    . Depression    . Diabetes mellitus, type 2 (CMS HCC)    . GERD (gastroesophageal reflux disease)    . History of concussion    . HTN (hypertension)    . Hyperlipidemia    . Stress    . Ulcerative colitis (CMS HCC)          Past Surgical History:   Procedure Laterality Date   . HX CESAREAN SECTION     . HX HYSTERECTOMY     . ORIF ANKLE FRACTURE     . TOTAL ABDOMINAL HYSTERECTOMY           Family Medical History:     Problem Relation (Age of Onset)    Other Mother, Father            Social  History     Tobacco Use   . Smoking status: Never Smoker   . Smokeless tobacco: Never Used   Substance Use Topics   . Alcohol use: No   . Drug use: Never       Review of Systems   Constitutional: Negative.    HENT: Negative.    Eyes: Negative.    Respiratory: Negative.    Cardiovascular: Negative.    Gastrointestinal: Negative.    Endocrine: Negative.    Genitourinary: Negative.    Musculoskeletal: Positive for arthralgias, back pain and neck pain.   Skin: Negative.    Allergic/Immunologic: Negative.    Neurological: Positive for weakness.   Hematological: Negative.    Psychiatric/Behavioral: Positive for sleep disturbance. Negative for self-injury and suicidal ideas. The patient is nervous/anxious.      Objective:   Vitals: BP 130/74   Pulse (!) 116   Ht 1.575 m (5\' 2" )   Wt 78.5 kg (173 lb)   SpO2 94%   BMI 31.64 kg/m         Physical Exam  Vitals signs and nursing note reviewed.   Constitutional:       Appearance: She is well-developed.   HENT:      Head: Normocephalic and atraumatic.   Eyes:      Conjunctiva/sclera: Conjunctivae normal.      Pupils: Pupils are equal, round, and reactive to light.   Neck:      Musculoskeletal: Normal range of motion and neck supple.   Cardiovascular:      Rate and Rhythm: Normal rate and  regular rhythm.      Heart sounds: Normal heart sounds. No murmur. No friction rub. No gallop.    Pulmonary:      Effort: Pulmonary effort is normal.      Breath sounds: Normal breath sounds.   Abdominal:      General: Bowel sounds are normal.      Palpations: Abdomen is soft.   Musculoskeletal:      Cervical back: She exhibits decreased range of motion and tenderness.      Lumbar back: She exhibits decreased range of motion and tenderness.   Skin:     General: Skin is warm and dry.   Neurological:      Mental Status: She is alert and oriented to person, place, and time.      Deep Tendon Reflexes: Reflexes are normal and symmetric.   Psychiatric:         Behavior: Behavior normal.         Thought Content: Thought content normal.         Judgment: Judgment normal.         Assessment & Plan:     ENCOUNTER DIAGNOSES     ICD-10-CM   1. Muscular atrophy, unspecified site  M62.50   2. Essential hypertension  I10   3. GERD without esophagitis  K21.9   4. Type 2 diabetes mellitus without complication, without long-term current use of insulin (CMS HCC)  E11.9   5. Anxiety  F41.9   6. Mood disorder (CMS HCC)  F39       No orders of the defined types were placed in this encounter.      Return in about 2 months (around 11/11/2019), or if symptoms worsen or fail to improve.    [name], PA-C

## 2019-09-11 NOTE — Addendum Note (Signed)
Addended by: [name] on: 09/11/2019 01:10 PM     Modules accepted: Orders

## 2019-09-12 ENCOUNTER — Ambulatory Visit (INDEPENDENT_AMBULATORY_CARE_PROVIDER_SITE_OTHER): Payer: Commercial Managed Care - PPO | Admitting: PSYCHIATRY

## 2019-09-12 ENCOUNTER — Other Ambulatory Visit: Payer: Self-pay

## 2019-09-12 ENCOUNTER — Encounter (HOSPITAL_BASED_OUTPATIENT_CLINIC_OR_DEPARTMENT_OTHER): Payer: Self-pay | Admitting: PSYCHIATRY

## 2019-09-12 VITALS — BP 132/70 | HR 95 | Resp 16 | Ht 62.0 in | Wt 173.0 lb

## 2019-09-12 DIAGNOSIS — F331 Major depressive disorder, recurrent, moderate: Secondary | ICD-10-CM

## 2019-09-12 DIAGNOSIS — F3341 Major depressive disorder, recurrent, in partial remission: Secondary | ICD-10-CM

## 2019-09-12 DIAGNOSIS — F410 Panic disorder [episodic paroxysmal anxiety] without agoraphobia: Secondary | ICD-10-CM

## 2019-09-12 MED ORDER — CLONAZEPAM 0.5 MG TABLET
0.50 mg | ORAL_TABLET | Freq: Three times a day (TID) | ORAL | 5 refills | Status: DC | PRN
Start: 2019-09-12 — End: 2020-01-08

## 2019-09-12 MED ORDER — DULOXETINE 60 MG CAPSULE,DELAYED RELEASE
60.0000 mg | DELAYED_RELEASE_CAPSULE | Freq: Every day | ORAL | 5 refills | Status: DC
Start: 2019-09-12 — End: 2020-01-08

## 2019-09-12 MED ORDER — MELATONIN 5 MG CAPSULE
1.0000 | ORAL_CAPSULE | Freq: Every evening | ORAL | 5 refills | Status: DC
Start: 2019-09-12 — End: 2020-01-08

## 2019-09-12 NOTE — Nursing Note (Signed)
09/12/19 1318   Ask Suicide-Screening Questions   1. In the past few weeks, have you wished you were dead? N   2. In the past few weeks, have you felt that you or your family would be better off if you were dead? N   3. In the past week, have you been having thoughts about killing yourself? N   4. Have you ever tried to kill yourself? N   Calculated Risk Score No Risk

## 2019-09-12 NOTE — Progress Notes (Signed)
ADULT AND ADOLESCENT Elizabeth Page, [name]  [address]  [place] [address]       Name: Elizabeth Page MRN:  [id number]   Date: 09/12/2019 Age: 73 y.o.       History:        Last visit:  03/16/2019    Interval History:  Is still at the [facility] apartments and is loud etc.  Had an argument with a worker there and she got a warning. She does puzzles and watches TV.    Son is in [place] and has an 31 month old and is doing well. Gets along with his wife better.  May move down to that area again and live in her own apartment. Has contact with brother but does not like his wife. Sister and she do not speak. Son has invited her to move down there.  She has sent her application in for the new apartment.  Is not sleeping well at night. Missing being around healthy people.    Main complaint is need for sleep and is to start PT soon to help with pain.    Quit her remeron 15mg  po qhs and is unsure why.  Wants something else she thinks and will recommend melatonin 5mg  po qhs.      Substance use: Denies any substance use.    Review of systems:   Constitutional: Denies any medication side effect.  GI: Denies any diarrhea, constipation.  Dermatological:  Denies any skin rash    Medical and Surgical history in the interim: Non-contributory.  Past Medical History:   Diagnosis Date   . Anxiety    . Depression    . Diabetes mellitus, type 2 (CMS HCC)    . GERD (gastroesophageal reflux disease)    . History of concussion    . HTN (hypertension)    . Hyperlipidemia    . Stress    . Ulcerative colitis (CMS HCC)          Past Surgical History:   Procedure Laterality Date   . HX CESAREAN SECTION     . HX HYSTERECTOMY     . ORIF ANKLE FRACTURE     . TOTAL ABDOMINAL HYSTERECTOMY           Current Outpatient Medications   Medication Sig Dispense Refill   . ACCU-CHEK AVIVA CONTROL SOLN Solution      . ACCU-CHEK SOFTCLIX LANCETS Misc      . albuterol sulfate (PROAIR HFA) 90 mcg/actuation Inhalation HFA Aerosol Inhaler Take 1-2 Puffs by  inhalation Every 6 hours as needed 6.7 g 1   . Amlodipine-Olmesartan 5-20 mg Oral Tablet Take 1 Tab by mouth Once a day 90 Tab 3   . azithromycin (ZITHROMAX) 250 mg Oral Tablet Take 500 mg (2 tab) on day 1; take 250 mg (1 tab) on days 2-5. 6 Tab 0   . B COMPLEX-VITAMIN B12 Oral Tablet      . clonazePAM (KLONOPIN) 0.5 mg Oral Tablet Take 1 Tab (0.5 mg total) by mouth Three times a day as needed for up to 180 days 90 Tab 5   . DULoxetine (CYMBALTA DR) 60 mg Oral Capsule, Delayed Release(E.C.) Take 1 Cap (60 mg total) by mouth Once a day for 180 days TAKE 1 CAPSULE BY MOUTH ONCE A DAY 30 Cap 5   . famotidine (PEPCID) 40 mg Oral Tablet Take 1 Tab (40 mg total) by mouth Twice daily 60 Tab 3   . levothyroxine (SYNTHROID) 50 mcg  Oral Tablet TAKE 1 TABLET BY MOUTH EVERY MORNING 30 Tab 0   . metformin (GLUCOPHAGE-XR) 750 mg Oral Tablet Sustained Release 24 hr TAKE 1 TABLET BY MOUTH TWICE A DAY 60 Tab 0   . Methylprednisolone (MEDROL DOSEPACK) 4 mg Oral Tablets, Dose Pack Take as instructed. 21 Tab 0   . mirtazapine (REMERON) 15 mg Oral Tablet Take 1 Tab (15 mg total) by mouth Every night for 180 days 30 Tab 5   . nystatin (NYSTOP) 100,000 unit/gram Powder use tid as dir 60 g 2   . pantoprazole (PROTONIX) 40 mg Oral Tablet, Delayed Release (E.C.) Take 1 Tab (40 mg total) by mouth Twice daily 180 Tab 3   . VITAMIN D 50,000 unit Oral Capsule TAKE ONE CAPSULE BY MOUTH ONCE WEEKLY 12 Cap 0     No current facility-administered medications for this visit.         Allergies:   Allergies   Allergen Reactions   . Allergy-Decongestant [Dexbrompheniramine-Pseudoephed]    . Codeine    . Cortisone    . Motrin [Ibuprofen]          Exam:      BP 132/70   Pulse 95   Resp 16   Ht 1.575 m (5\' 2" )   Wt 78.5 kg (173 lb)   SpO2 98%   BMI 31.64 kg/m       General appearance: Well dressed, well groomed. Calm and pleasant.  Musculoskeletal: Normal gait and station. Normal muscle strength and tone.  Speech: Articulate, appropriate with  spontaneous elaborations  Affect: Congruent with mood which is described as "OK I guess".  Thought process: Logical, linear and goal directed.  Association: Intact  Abnormal thought: None and denies SI/HI or psychotic sx.  Judgement and Insight: Good.  Attention and concentration: Good.  Language: Fluent use of English.      Assessment: Diagnosis: unchanged.   Overall she is stable psychiatrically.     Substance use is not a concern. No legal issues. No acute suicide risk. Discussed status and reviewed options for therapy. Discussed medications, psychotherapy and structures/schedule of program. Reviewed plan, goals, rationale, risks, benefits, and alternatives. Patient indicates understanding and agreement. Has decision making capacity.        ICD-10-CM    1. Recurrent major depressive disorder, in partial remission (CMS HCC)  F33.41    2. Panic disorder  F41.0         Plan:   Medications: Will add melatonin 5mg  po qhs to current meds.  Therapy:  Continue current supportive work.  RTC: 4 months.  Labs/test/imagining: None   I spent over 25 minutes on this case today with the patient present over 66% of that time and we discussed her treatment plan, issues, medications, potential medication side effects including dependence on the Klonopin and I obtained informed consent.  Over 50% of her time in session today was spent on counseling, supportive psychotherapy and patient Education.      Awanda Mink, MD      This note was partially created using voice recognition software and is inherently subject to errors including those of syntax and "sound-alike" substitutions which may escape proofreading.  In such instances, original meaning may be extrapolated by contextual derivation.

## 2019-10-02 ENCOUNTER — Other Ambulatory Visit (HOSPITAL_BASED_OUTPATIENT_CLINIC_OR_DEPARTMENT_OTHER): Payer: Self-pay | Admitting: PHYSICIAN ASSISTANT

## 2019-10-02 DIAGNOSIS — E119 Type 2 diabetes mellitus without complications: Secondary | ICD-10-CM

## 2019-10-02 MED ORDER — BENZONATATE 200 MG CAPSULE
200.00 mg | ORAL_CAPSULE | Freq: Three times a day (TID) | ORAL | 0 refills | Status: AC
Start: 2019-10-02 — End: 2019-10-12

## 2019-10-08 ENCOUNTER — Other Ambulatory Visit (HOSPITAL_BASED_OUTPATIENT_CLINIC_OR_DEPARTMENT_OTHER): Payer: Self-pay | Admitting: PHYSICIAN ASSISTANT

## 2019-10-08 DIAGNOSIS — E119 Type 2 diabetes mellitus without complications: Secondary | ICD-10-CM

## 2019-10-08 DIAGNOSIS — E039 Hypothyroidism, unspecified: Secondary | ICD-10-CM

## 2019-10-16 ENCOUNTER — Ambulatory Visit (HOSPITAL_BASED_OUTPATIENT_CLINIC_OR_DEPARTMENT_OTHER): Payer: Self-pay | Admitting: PHYSICIAN ASSISTANT

## 2019-11-05 ENCOUNTER — Other Ambulatory Visit (HOSPITAL_BASED_OUTPATIENT_CLINIC_OR_DEPARTMENT_OTHER): Payer: Self-pay | Admitting: PSYCHIATRY

## 2019-11-05 DIAGNOSIS — F331 Major depressive disorder, recurrent, moderate: Secondary | ICD-10-CM

## 2019-11-09 ENCOUNTER — Encounter (HOSPITAL_BASED_OUTPATIENT_CLINIC_OR_DEPARTMENT_OTHER): Payer: Commercial Managed Care - PPO | Admitting: PSYCHIATRY

## 2019-11-13 ENCOUNTER — Other Ambulatory Visit: Payer: Self-pay

## 2019-11-13 ENCOUNTER — Encounter (HOSPITAL_BASED_OUTPATIENT_CLINIC_OR_DEPARTMENT_OTHER): Payer: Self-pay | Admitting: PHYSICIAN ASSISTANT

## 2019-11-13 ENCOUNTER — Ambulatory Visit: Payer: Medicare PPO | Attending: PHYSICIAN ASSISTANT | Admitting: PHYSICIAN ASSISTANT

## 2019-11-13 VITALS — BP 124/76 | HR 108 | Ht 62.0 in | Wt 171.0 lb

## 2019-11-13 DIAGNOSIS — E119 Type 2 diabetes mellitus without complications: Secondary | ICD-10-CM | POA: Insufficient documentation

## 2019-11-13 DIAGNOSIS — I10 Essential (primary) hypertension: Secondary | ICD-10-CM | POA: Insufficient documentation

## 2019-11-13 DIAGNOSIS — F39 Unspecified mood [affective] disorder: Secondary | ICD-10-CM

## 2019-11-13 DIAGNOSIS — F329 Major depressive disorder, single episode, unspecified: Secondary | ICD-10-CM | POA: Insufficient documentation

## 2019-11-13 DIAGNOSIS — K219 Gastro-esophageal reflux disease without esophagitis: Secondary | ICD-10-CM | POA: Insufficient documentation

## 2019-11-13 DIAGNOSIS — F419 Anxiety disorder, unspecified: Secondary | ICD-10-CM | POA: Insufficient documentation

## 2019-11-13 NOTE — Progress Notes (Signed)
[facility]  [address]  [place] [address]  Phone: [phone]    Encounter Date: 11/13/2019    Patient ID:  Elizabeth Page  EQA:[id number]    DOB: [birth year]  Age: 74 y.o. female    Subjective:     Chief Complaint   Patient presents with   . Follow Up     2 mo f/u      HERE FOR A FOLLOW UP.  She IS HAVING MORE ANXIETY AND DEPRESSION.  SEEING PSYCH.    The history is provided by the patient.     Current Outpatient Medications   Medication Sig   . ACCU-CHEK AVIVA CONTROL SOLN Solution    . ACCU-CHEK SOFTCLIX LANCETS Misc    . Amlodipine-Olmesartan 5-20 mg Oral Tablet Take 1 Tab by mouth Once a day   . B COMPLEX-VITAMIN B12 Oral Tablet    . clonazePAM (KLONOPIN) 0.5 mg Oral Tablet Take 1 Tab (0.5 mg total) by mouth Three times a day as needed for up to 180 days   . DULoxetine (CYMBALTA DR) 60 mg Oral Capsule, Delayed Release(E.C.) Take 1 Cap (60 mg total) by mouth Once a day for 180 days TAKE 1 CAPSULE BY MOUTH ONCE A DAY   . famotidine (PEPCID) 40 mg Oral Tablet Take 1 Tab (40 mg total) by mouth Twice daily   . levothyroxine (SYNTHROID) 50 mcg Oral Tablet TAKE 1 TABLET BY MOUTH EVERY MORNING ON AN EMPTY STOMACH 30-60 MINUTES BEFORE FOOD   . Melatonin 5 mg Oral Capsule Take 1 Cap (5 mg total) by mouth Every night for 180 days   . metformin (GLUCOPHAGE-XR) 750 mg Oral Tablet Sustained Release 24 hr TAKE 1 TABLET BY MOUTH TWICE A DAY   . pantoprazole (PROTONIX) 40 mg Oral Tablet, Delayed Release (E.C.) Take 1 Tab (40 mg total) by mouth Twice daily     Allergies   Allergen Reactions   . Allergy-Decongestant [Dexbrompheniramine-Pseudoephed]    . Codeine    . Cortisone    . Motrin [Ibuprofen]      Past Medical History:   Diagnosis Date   . Anxiety    . Depression    . Diabetes mellitus, type 2 (CMS HCC)    . GERD (gastroesophageal reflux disease)    . History of concussion    . HTN (hypertension)    . Hyperlipidemia    . Stress    . Ulcerative colitis (CMS [place])          Past Surgical  History:   Procedure Laterality Date   . HX CESAREAN SECTION     . HX HYSTERECTOMY     . ORIF ANKLE FRACTURE     . TOTAL ABDOMINAL HYSTERECTOMY           Family Medical History:     Problem Relation (Age of Onset)    Other Mother, Father            Social History     Tobacco Use   . Smoking status: Never Smoker   . Smokeless tobacco: Never Used   Substance Use Topics   . Alcohol use: No   . Drug use: Never       Review of Systems   Constitutional: Negative.    HENT: Negative.    Eyes: Negative.    Respiratory: Negative.    Cardiovascular: Negative.    Gastrointestinal: Negative.    Endocrine: Negative.    Genitourinary: Negative.  Musculoskeletal: Negative.    Skin: Negative.    Allergic/Immunologic: Negative.    Neurological: Negative.    Hematological: Negative.    Psychiatric/Behavioral: Negative for self-injury, sleep disturbance and suicidal ideas. The patient is nervous/anxious.      Objective:   Vitals: BP 124/76   Pulse (!) 108   Ht 1.575 m (5\' 2" )   Wt 77.6 kg (171 lb)   SpO2 96%   BMI 31.28 kg/m         Physical Exam  Vitals signs and nursing note reviewed.   Constitutional:       Appearance: She is well-developed.   HENT:      Head: Normocephalic and atraumatic.   Eyes:      Conjunctiva/sclera: Conjunctivae normal.      Pupils: Pupils are equal, round, and reactive to light.   Neck:      Musculoskeletal: Normal range of motion and neck supple.   Cardiovascular:      Rate and Rhythm: Normal rate and regular rhythm.      Heart sounds: Normal heart sounds. No murmur. No friction rub. No gallop.    Pulmonary:      Effort: Pulmonary effort is normal.      Breath sounds: Normal breath sounds.   Abdominal:      General: Bowel sounds are normal.      Palpations: Abdomen is soft.   Musculoskeletal: Normal range of motion.   Skin:     General: Skin is warm and dry.   Neurological:      Mental Status: She is alert and oriented to person, place, and time.      Deep Tendon Reflexes: Reflexes are normal and  symmetric.   Psychiatric:         Behavior: Behavior normal.         Thought Content: Thought content normal.         Judgment: Judgment normal.         Assessment & Plan:     ENCOUNTER DIAGNOSES     ICD-10-CM   1. Essential hypertension  I10   2. GERD without esophagitis  K21.9   3. Type 2 diabetes mellitus without complication, without long-term current use of insulin (CMS HCC)  E11.9   4. Anxiety  F41.9   5. Mood disorder (CMS HCC)  F39   FOLLOWS WITH PSYCH.  DEFERS LABS.    No orders of the defined types were placed in this encounter.      Return in about 3 months (around 02/11/2020), or if symptoms worsen or fail to improve.    02/13/2020, PA-C

## 2020-01-04 ENCOUNTER — Other Ambulatory Visit (HOSPITAL_BASED_OUTPATIENT_CLINIC_OR_DEPARTMENT_OTHER): Payer: Self-pay | Admitting: PHYSICIAN ASSISTANT

## 2020-01-04 DIAGNOSIS — E039 Hypothyroidism, unspecified: Secondary | ICD-10-CM

## 2020-01-08 ENCOUNTER — Ambulatory Visit (HOSPITAL_BASED_OUTPATIENT_CLINIC_OR_DEPARTMENT_OTHER): Payer: Commercial Managed Care - PPO | Admitting: PSYCHIATRY

## 2020-01-08 ENCOUNTER — Other Ambulatory Visit: Payer: Self-pay

## 2020-01-08 ENCOUNTER — Encounter (HOSPITAL_BASED_OUTPATIENT_CLINIC_OR_DEPARTMENT_OTHER): Payer: Self-pay | Admitting: PSYCHIATRY

## 2020-01-08 VITALS — BP 128/68 | HR 107 | Resp 16 | Ht 62.5 in | Wt 168.0 lb

## 2020-01-08 DIAGNOSIS — F331 Major depressive disorder, recurrent, moderate: Secondary | ICD-10-CM

## 2020-01-08 DIAGNOSIS — F410 Panic disorder [episodic paroxysmal anxiety] without agoraphobia: Secondary | ICD-10-CM

## 2020-01-08 DIAGNOSIS — F3341 Major depressive disorder, recurrent, in partial remission: Secondary | ICD-10-CM

## 2020-01-08 MED ORDER — DULOXETINE 60 MG CAPSULE,DELAYED RELEASE
60.0000 mg | DELAYED_RELEASE_CAPSULE | Freq: Every day | ORAL | 5 refills | Status: DC
Start: 2020-01-08 — End: 2020-05-14

## 2020-01-08 MED ORDER — CLONAZEPAM 0.5 MG TABLET
0.50 mg | ORAL_TABLET | Freq: Three times a day (TID) | ORAL | 5 refills | Status: DC | PRN
Start: 2020-01-08 — End: 2020-05-14

## 2020-01-08 NOTE — Progress Notes (Signed)
ADULT AND ADOLESCENT Elizabeth Page, Metro Kung  [address]  [place] [address]       Name: Elizabeth Page MRN:  [id number]   Date: 01/08/2020 Age: 74 y.o.       History:        Last visit: 09/12/2019    Interval History:  Dislikes living in the [facility] apartments and has conflicts and wants to move. Says she has been looking for another apartment but cost is an issue. Has put in an application for an apartment in [place] near her son.    Discussed her conflicts at her apartment and asked her to avoid confrontation. Says she has been stressed and somewhat depressed. Enjoys her TV and reading. I encouraged her to move to [place] ASAP.      Substance use: Denies any substance use.    Review of systems:   Constitutional: Denies any medication side effect.  GI: Denies any diarrhea, constipation.  Dermatological:  Denies any skin rash    Medical and Surgical history in the interim: Non-contributory.  Past Medical History:   Diagnosis Date   . Anxiety    . Depression    . Diabetes mellitus, type 2 (CMS HCC)    . GERD (gastroesophageal reflux disease)    . History of concussion    . HTN (hypertension)    . Hyperlipidemia    . Stress    . Ulcerative colitis (CMS [place])        Past Surgical History:   Procedure Laterality Date   . HX CESAREAN SECTION     . HX HYSTERECTOMY     . ORIF ANKLE FRACTURE     . TOTAL ABDOMINAL HYSTERECTOMY         Current Outpatient Medications   Medication Sig Dispense Refill   . ACCU-CHEK AVIVA CONTROL SOLN Solution      . ACCU-CHEK SOFTCLIX LANCETS Misc      . Amlodipine-Olmesartan 5-20 mg Oral Tablet Take 1 Tab by mouth Once a day 90 Tab 3   . B COMPLEX-VITAMIN B12 Oral Tablet  (Patient not taking: Reported on 01/08/2020)     . clonazePAM (KLONOPIN) 0.5 mg Oral Tablet Take 1 Tab (0.5 mg total) by mouth Three times a day as needed for up to 180 days 90 Tab 5   . DULoxetine (CYMBALTA DR) 60 mg Oral Capsule, Delayed Release(E.C.) Take 1 Cap (60 mg total) by mouth Once a day for 180 days TAKE 1 CAPSULE BY  MOUTH ONCE A DAY 30 Cap 5   . famotidine (PEPCID) 40 mg Oral Tablet Take 1 Tab (40 mg total) by mouth Twice daily 60 Tab 3   . levothyroxine (SYNTHROID) 50 mcg Oral Tablet TAKE 1 TABLET BY MOUTH EVERY MORNING ON AN EMPTY STOMACH 30-60 MINUTES BEFORE FOOD 30 Tablet 1   . Melatonin 5 mg Oral Capsule Take 1 Cap (5 mg total) by mouth Every night for 180 days (Patient not taking: Reported on 01/08/2020) 30 Cap 5   . metformin (GLUCOPHAGE-XR) 750 mg Oral Tablet Sustained Release 24 hr TAKE 1 TABLET BY MOUTH TWICE A DAY 60 Tab 3   . pantoprazole (PROTONIX) 40 mg Oral Tablet, Delayed Release (E.C.) Take 1 Tab (40 mg total) by mouth Twice daily 180 Tab 3     No current facility-administered medications for this visit.        Allergies:   Allergies   Allergen Reactions   . Allergy-Decongestant [Dexbrompheniramine-Pseudoephed]    . Codeine    .  Cortisone    . Motrin [Ibuprofen]        Exam:      BP 128/68   Pulse (!) 107   Resp 16   Ht 1.588 m (5' 2.5")   Wt 76.2 kg (168 lb)   SpO2 96%   BMI 30.24 kg/m       General appearance: Well dressed, well groomed. Calm and pleasant.  Musculoskeletal: Normal gait and station. Normal muscle strength and tone.  Speech: Articulate, appropriate with spontaneous elaborations  Affect: Congruent with mood which is described as "OK".  Thought process: Logical, linear and goal directed.  Association: Intact  Abnormal thought: None and denies SI/HI or psychotic sx.  Judgement and Insight: Good.  Attention and concentration: Good.  Language: Fluent use of English.      Assessment: Diagnosis: unchanged.   Psychiatrically stable.    Substance use is not a concern. No legal issues. No acute suicide risk. Discussed status and reviewed options for therapy. Discussed medications, psychotherapy and structures/schedule of program. Reviewed plan, goals, rationale, risks, benefits, and alternatives. Patient indicates understanding and agreement. Has decision making capacity.        ICD-10-CM    1.  Recurrent major depressive disorder, in partial remission (CMS HCC)  F33.41         Plan:   Medications: Continue current.  Therapy:  Continue current supportive work.  RTC: 4 months.  Labs/test/imagining: None    I spent over 25 minutes on this case today with the patient present over 66% of that time and we discussed her treatment plan, issues, medications, potential medication adverse effects and I obtained informed consent.  Over 50% of her time was spent on counseling, supportive psychotherapy and patient Education.      Latina [name], MD      This note was partially created using voice recognition software and is inherently subject to errors including those of syntax and "sound-alike" substitutions which may escape proofreading.  In such instances, original meaning may be extrapolated by contextual derivation.

## 2020-01-08 NOTE — Nursing Note (Signed)
01/08/20 1406   [place] Suicide Severity Rating Scale (Since Last Contact Screener)   1. Wish to be Dead (Since Last Contact) N   2. Non-Specific Active Suicidal Thoughts (Since Last Contact) N   6. Suicidal Behavior (Since Last Contact) N   Calculated C-SSRS Risk Score (Since Last Contact) No Risk Indicated

## 2020-01-22 ENCOUNTER — Other Ambulatory Visit (HOSPITAL_BASED_OUTPATIENT_CLINIC_OR_DEPARTMENT_OTHER): Payer: Self-pay | Admitting: PHYSICIAN ASSISTANT

## 2020-01-22 DIAGNOSIS — E039 Hypothyroidism, unspecified: Secondary | ICD-10-CM

## 2020-02-04 ENCOUNTER — Other Ambulatory Visit (HOSPITAL_BASED_OUTPATIENT_CLINIC_OR_DEPARTMENT_OTHER): Payer: Self-pay | Admitting: PHYSICIAN ASSISTANT

## 2020-02-04 DIAGNOSIS — E119 Type 2 diabetes mellitus without complications: Secondary | ICD-10-CM

## 2020-02-12 ENCOUNTER — Ambulatory Visit: Payer: Commercial Managed Care - PPO | Attending: PHYSICIAN ASSISTANT | Admitting: PHYSICIAN ASSISTANT

## 2020-02-12 ENCOUNTER — Encounter (HOSPITAL_BASED_OUTPATIENT_CLINIC_OR_DEPARTMENT_OTHER): Payer: Self-pay | Admitting: PHYSICIAN ASSISTANT

## 2020-02-12 ENCOUNTER — Other Ambulatory Visit: Payer: Self-pay

## 2020-02-12 VITALS — BP 120/64 | HR 93 | Ht 62.0 in | Wt 169.0 lb

## 2020-02-12 DIAGNOSIS — E039 Hypothyroidism, unspecified: Secondary | ICD-10-CM | POA: Insufficient documentation

## 2020-02-12 DIAGNOSIS — Z7984 Long term (current) use of oral hypoglycemic drugs: Secondary | ICD-10-CM | POA: Insufficient documentation

## 2020-02-12 DIAGNOSIS — K219 Gastro-esophageal reflux disease without esophagitis: Secondary | ICD-10-CM | POA: Insufficient documentation

## 2020-02-12 DIAGNOSIS — F419 Anxiety disorder, unspecified: Secondary | ICD-10-CM | POA: Insufficient documentation

## 2020-02-12 DIAGNOSIS — Z7689 Persons encountering health services in other specified circumstances: Secondary | ICD-10-CM | POA: Insufficient documentation

## 2020-02-12 DIAGNOSIS — F39 Unspecified mood [affective] disorder: Secondary | ICD-10-CM

## 2020-02-12 DIAGNOSIS — I10 Essential (primary) hypertension: Secondary | ICD-10-CM | POA: Insufficient documentation

## 2020-02-12 DIAGNOSIS — E119 Type 2 diabetes mellitus without complications: Secondary | ICD-10-CM | POA: Insufficient documentation

## 2020-02-12 NOTE — Progress Notes (Signed)
[facility]  [place] [address]  Phone: [phone]  Fax: [phone]    Encounter Date: 02/12/2020    Patient ID:  CONNI KNIGHTON  OEU:[id number]    DOB: [birth year]  Age: 74 y.o. female    Subjective:     Chief Complaint   Patient presents with    Follow Up     3 mo f/u      HERE FOR 3 MONTH FOLLOW UP AND MED CHECK.  She IS SEEING PSYCH.  HAS NOT GOTTEN ANY LABS THAT I ORDERED DONE.    The history is provided by the patient.     Current Outpatient Medications   Medication Sig    ACCU-CHEK AVIVA CONTROL SOLN Solution     ACCU-CHEK SOFTCLIX LANCETS Misc     Amlodipine-Olmesartan 5-20 mg Oral Tablet Take 1 Tab by mouth Once a day    clonazePAM (KLONOPIN) 0.5 mg Oral Tablet Take 1 Tablet (0.5 mg total) by mouth Three times a day as needed for up to 180 days    DULoxetine (CYMBALTA DR) 60 mg Oral Capsule, Delayed Release(E.C.) Take 1 Capsule (60 mg total) by mouth Once a day for 180 days TAKE 1 CAPSULE BY MOUTH ONCE A DAY    famotidine (PEPCID) 40 mg Oral Tablet Take 1 Tab (40 mg total) by mouth Twice daily    levothyroxine (SYNTHROID) 50 mcg Oral Tablet TAKE 1 TABLET BY MOUTH EVERY MORNING ON AN EMPTY STOMACH 30-60 MINUTES BEFORE FOOD    metformin (GLUCOPHAGE-XR) 750 mg Oral Tablet Sustained Release 24 hr TAKE 1 TABLET BY MOUTH TWICE A DAY    pantoprazole (PROTONIX) 40 mg Oral Tablet, Delayed Release (E.C.) Take 1 Tab (40 mg total) by mouth Twice daily     Allergies   Allergen Reactions    Allergy-Decongestant [Dexbrompheniramine-Pseudoephed]     Codeine     Cortisone     Motrin [Ibuprofen]      Past Medical History:   Diagnosis Date    Anxiety     Depression     Diabetes mellitus, type 2 (CMS HCC)     GERD (gastroesophageal reflux disease)     History of concussion     HTN (hypertension)     Hyperlipidemia     Stress     Ulcerative colitis (CMS HCC)          Past Surgical History:   Procedure Laterality Date    HX CESAREAN SECTION      HX  HYSTERECTOMY      ORIF ANKLE FRACTURE      TOTAL ABDOMINAL HYSTERECTOMY           Family Medical History:     Problem Relation (Age of Onset)    Other Mother, Father            Social History     Tobacco Use    Smoking status: Never Smoker    Smokeless tobacco: Never Used   Substance Use Topics    Alcohol use: No    Drug use: Never       Review of Systems   Constitutional: Positive for fatigue.   HENT: Negative.    Eyes: Negative.    Respiratory: Negative.    Cardiovascular: Negative.    Gastrointestinal: Negative.    Endocrine: Negative.    Genitourinary: Negative.    Musculoskeletal: Positive for arthralgias.   Skin: Negative.    Allergic/Immunologic: Negative.    Neurological:  Negative.    Hematological: Negative.    Psychiatric/Behavioral: Positive for sleep disturbance. Negative for self-injury and suicidal ideas. The patient is nervous/anxious.      Objective:   Vitals: BP 120/64    Pulse 93    Ht 1.575 m (5\' 2" )    Wt 76.7 kg (169 lb)    SpO2 97%    BMI 30.91 kg/m         Physical Exam  Vitals and nursing note reviewed.   Constitutional:       Appearance: She is well-developed.   HENT:      Head: Normocephalic and atraumatic.   Eyes:      Conjunctiva/sclera: Conjunctivae normal.      Pupils: Pupils are equal, round, and reactive to light.   Cardiovascular:      Rate and Rhythm: Normal rate and regular rhythm.      Heart sounds: Normal heart sounds. No murmur heard.   No friction rub. No gallop.    Pulmonary:      Effort: Pulmonary effort is normal.      Breath sounds: Normal breath sounds.   Abdominal:      General: Bowel sounds are normal.      Palpations: Abdomen is soft.   Musculoskeletal:         General: Normal range of motion.      Cervical back: Normal range of motion and neck supple.   Skin:     General: Skin is warm and dry.   Neurological:      Mental Status: She is alert and oriented to person, place, and time.      Deep Tendon Reflexes: Reflexes are normal and symmetric.   Psychiatric:            Behavior: Behavior normal.         Thought Content: Thought content normal.         Judgment: Judgment normal.         Assessment & Plan:     ENCOUNTER DIAGNOSES     ICD-10-CM   1. Mood disorder (CMS HCC)  F39   2. Anxiety  F41.9   3. Essential hypertension  I10   4. Type 2 diabetes mellitus without complication, without long-term current use of insulin (CMS HCC)  E11.9   5. GERD without esophagitis  K21.9   6. Acquired hypothyroidism  E03.9       Orders Placed This Encounter    CBC/DIFF    Comp Metabolic Panel-Non Fasting    Hemoglobin A1C    TSH w/ Free T4 Reflex     DEFERS MAMMOGRAM.  Return in about 3 months (around 05/13/2020), or if symptoms worsen or fail to improve.    05/15/2020, PA-C

## 2020-03-04 ENCOUNTER — Other Ambulatory Visit (HOSPITAL_BASED_OUTPATIENT_CLINIC_OR_DEPARTMENT_OTHER): Payer: Self-pay | Admitting: PHYSICIAN ASSISTANT

## 2020-04-04 ENCOUNTER — Other Ambulatory Visit (HOSPITAL_BASED_OUTPATIENT_CLINIC_OR_DEPARTMENT_OTHER): Payer: Self-pay | Admitting: PHYSICIAN ASSISTANT

## 2020-05-07 ENCOUNTER — Encounter (INDEPENDENT_AMBULATORY_CARE_PROVIDER_SITE_OTHER): Payer: Self-pay | Admitting: PSYCHIATRY

## 2020-05-14 ENCOUNTER — Ambulatory Visit (INDEPENDENT_AMBULATORY_CARE_PROVIDER_SITE_OTHER): Payer: Commercial Managed Care - PPO | Admitting: PSYCHIATRY

## 2020-05-14 ENCOUNTER — Encounter (INDEPENDENT_AMBULATORY_CARE_PROVIDER_SITE_OTHER): Payer: Self-pay | Admitting: PSYCHIATRY

## 2020-05-14 ENCOUNTER — Other Ambulatory Visit: Payer: Self-pay

## 2020-05-14 VITALS — BP 128/72 | HR 99 | Resp 16 | Ht 62.5 in | Wt 165.0 lb

## 2020-05-14 DIAGNOSIS — F411 Generalized anxiety disorder: Secondary | ICD-10-CM

## 2020-05-14 DIAGNOSIS — F3341 Major depressive disorder, recurrent, in partial remission: Secondary | ICD-10-CM

## 2020-05-14 DIAGNOSIS — F331 Major depressive disorder, recurrent, moderate: Secondary | ICD-10-CM

## 2020-05-14 DIAGNOSIS — F410 Panic disorder [episodic paroxysmal anxiety] without agoraphobia: Secondary | ICD-10-CM

## 2020-05-14 MED ORDER — DULOXETINE 60 MG CAPSULE,DELAYED RELEASE
60.00 mg | DELAYED_RELEASE_CAPSULE | Freq: Every day | ORAL | 5 refills | Status: DC
Start: 2020-05-14 — End: 2020-11-11

## 2020-05-14 MED ORDER — CLONAZEPAM 0.5 MG TABLET
0.50 mg | ORAL_TABLET | Freq: Three times a day (TID) | ORAL | 5 refills | Status: DC | PRN
Start: 2020-05-14 — End: 2020-11-12

## 2020-05-14 NOTE — Progress Notes (Signed)
[facility]   [address]  [place] [address]       Name: Elizabeth Page MRN:  [id number]   Date: 05/14/2020 Age: 74 y.o.       History:        Last visit: 01/08/2020    Interval History: Is on waiting list for apartment near son in [place]. Current apartment is unhealthy and very loud. Still with some conflicts with daughter-in-law. We discussed that and she agreed to keep her mouth shut.    No alcohol or drug use. Compliant with medications. Some dysphoria as she has to isolate to her apartment. Using klonopin 3 times per day due to panic sx.      Substance use: Denies any substance use.    Review of systems:   Constitutional: Denies any medication side effect.  GI: Denies any diarrhea, constipation.  Dermatological:  Denies any skin rash    Medical and Surgical history in the interim: Non-contributory.  Past Medical History:   Diagnosis Date    Anxiety     Depression     Diabetes mellitus, type 2 (CMS HCC)     GERD (gastroesophageal reflux disease)     History of concussion     HTN (hypertension)     Hyperlipidemia     Stress     Ulcerative colitis (CMS HCC)        Past Surgical History:   Procedure Laterality Date    HX CESAREAN SECTION      HX HYSTERECTOMY      ORIF ANKLE FRACTURE      TOTAL ABDOMINAL HYSTERECTOMY         Current Outpatient Medications   Medication Sig Dispense Refill    ACCU-CHEK AVIVA CONTROL SOLN Solution       ACCU-CHEK SOFTCLIX LANCETS Misc       Amlodipine-Olmesartan 5-20 mg Oral Tablet TAKE 1 TABLET BY MOUTH ONCE A DAY 30 Tablet 4    clonazePAM (KLONOPIN) 0.5 mg Oral Tablet Take 1 Tablet (0.5 mg total) by mouth Three times a day as needed for up to 180 days 90 Tablet 5    DULoxetine (CYMBALTA DR) 60 mg Oral Capsule, Delayed Release(E.C.) Take 1 Capsule (60 mg total) by mouth Once a day for 180 days TAKE 1 CAPSULE BY MOUTH ONCE A DAY 30 Capsule 5    famotidine (PEPCID) 40 mg Oral Tablet Take 1 Tab (40 mg total)  by mouth Twice daily 60 Tab 3    levothyroxine (SYNTHROID) 50 mcg Oral Tablet TAKE 1 TABLET BY MOUTH EVERY MORNING ON AN EMPTY STOMACH 30-60 MINUTES BEFORE FOOD 30 Tablet 4    metformin (GLUCOPHAGE-XR) 750 mg Oral Tablet Sustained Release 24 hr TAKE 1 TABLET BY MOUTH TWICE A DAY 60 Tablet 3    pantoprazole (PROTONIX) 40 mg Oral Tablet, Delayed Release (E.C.) Take 1 Tablet (40 mg total) by mouth Twice daily 180 Tablet 4     No current facility-administered medications for this visit.        Allergies:   Allergies   Allergen Reactions    Allergy-Decongestant [Dexbrompheniramine-Pseudoephed]     Codeine     Cortisone     Motrin [Ibuprofen]        Exam:      BP 128/72    Pulse 99    Resp 16    Ht 1.588 m (5' 2.5")    Wt 74.8 kg (165 lb)    SpO2 95%  BMI 29.70 kg/m       General appearance: Well dressed, well groomed. Calm and pleasant.  Musculoskeletal: Normal gait and station. Normal muscle strength and tone.  Speech: Articulate, appropriate with spontaneous elaborations  Affect: Congruent with mood which is described as fine".  Thought process: Logical, linear and goal directed.  Association: Intact  Abnormal thought: None and denies SI/HI or psychotic sx.  Judgement and Insight: Good.  Attention and concentration: Adequate  Language: Fluent use of English.    Assessment: Diagnosis: unchanged.   Stable psychiatrically.    Substance use is not a concern. No legal issues. No acute suicide risk. Discussed status and reviewed options for therapy. Discussed medications, psychotherapy and structures/schedule of program. Reviewed plan, goals, rationale, risks, benefits, and alternatives. Patient indicates understanding and agreement. Has decision making capacity.        ICD-10-CM    1. Recurrent major depressive disorder, in partial remission (CMS HCC)  F33.41    2. GAD (generalized anxiety disorder)  F41.1         Plan:   Medications: Continue current.  Therapy:  Continue current supportive work.  RTC: 6  months.  Labs/test/imagining: None  I spent over 25 minutes on this case today with the patient present over 66% of that time and we discussed her treatment plan, issues, medications, potential medication adverse effects and I obtained informed consent.  Over 50% of her time was spent on counseling, supportive psychotherapy and patient Education.      Latina [name], MD      This note was partially created using voice recognition software and is inherently subject to errors including those of syntax and "sound-alike substitutions which may escape proofreading.  In such instances, original meaning may be extrapolated by contextual derivation.

## 2020-05-14 NOTE — Nursing Note (Signed)
05/14/20 1340   [place] Suicide Severity Rating Scale (Since Last Contact Screener)   1. Wish to be Dead (Since Last Contact) N   2. Non-Specific Active Suicidal Thoughts (Since Last Contact) N   6. Suicidal Behavior (Since Last Contact) N   Calculated C-SSRS Risk Score (Since Last Contact) No Risk Indicated

## 2020-05-14 NOTE — Nursing Note (Signed)
05/14/20 1340   PHQ 9 (follow up)   Little interest or pleasure in doing things. 3   Feeling down, depressed, or hopeless 3   PHQ 2 Total 6   Trouble falling or staying asleep, or sleeping too much. 0   Feeling tired or having little energy 3   Poor appetite or overeating 3   Feeling bad about yourself/ that you are a failure in the past 2 weeks? 0   Trouble concentrating on things in the past 2 weeks? 0   Moving/Speaking slowly or being fidgety or restless  in the past 2 weeks? 1   Thoughts that you would be better off DEAD, or of hurting yourself in some way. 0   If you checked off any problems, how difficult have these problems made it for you to do your work, take care of things at home, or get along with other people? Not difficult at all   PHQ 9 Total 13

## 2020-05-20 ENCOUNTER — Ambulatory Visit (HOSPITAL_BASED_OUTPATIENT_CLINIC_OR_DEPARTMENT_OTHER): Payer: Self-pay | Admitting: PHYSICIAN ASSISTANT

## 2020-06-02 ENCOUNTER — Other Ambulatory Visit (HOSPITAL_BASED_OUTPATIENT_CLINIC_OR_DEPARTMENT_OTHER): Payer: Self-pay | Admitting: PHYSICIAN ASSISTANT

## 2020-06-02 DIAGNOSIS — E039 Hypothyroidism, unspecified: Secondary | ICD-10-CM

## 2020-06-02 DIAGNOSIS — E119 Type 2 diabetes mellitus without complications: Secondary | ICD-10-CM

## 2020-07-04 ENCOUNTER — Ambulatory Visit (HOSPITAL_BASED_OUTPATIENT_CLINIC_OR_DEPARTMENT_OTHER): Payer: Self-pay | Admitting: PHYSICIAN ASSISTANT

## 2020-07-22 ENCOUNTER — Encounter (INDEPENDENT_AMBULATORY_CARE_PROVIDER_SITE_OTHER): Payer: Commercial Managed Care - PPO | Admitting: PSYCHIATRY

## 2020-09-23 ENCOUNTER — Other Ambulatory Visit (HOSPITAL_BASED_OUTPATIENT_CLINIC_OR_DEPARTMENT_OTHER): Payer: Self-pay | Admitting: PHYSICIAN ASSISTANT

## 2020-09-23 DIAGNOSIS — E119 Type 2 diabetes mellitus without complications: Secondary | ICD-10-CM

## 2020-11-07 ENCOUNTER — Other Ambulatory Visit (INDEPENDENT_AMBULATORY_CARE_PROVIDER_SITE_OTHER): Payer: Self-pay | Admitting: PSYCHIATRY

## 2020-11-07 ENCOUNTER — Other Ambulatory Visit (HOSPITAL_BASED_OUTPATIENT_CLINIC_OR_DEPARTMENT_OTHER): Payer: Self-pay | Admitting: PHYSICIAN ASSISTANT

## 2020-11-07 DIAGNOSIS — F331 Major depressive disorder, recurrent, moderate: Secondary | ICD-10-CM

## 2020-11-07 DIAGNOSIS — E119 Type 2 diabetes mellitus without complications: Secondary | ICD-10-CM

## 2020-11-07 DIAGNOSIS — E039 Hypothyroidism, unspecified: Secondary | ICD-10-CM

## 2020-11-10 ENCOUNTER — Encounter (INDEPENDENT_AMBULATORY_CARE_PROVIDER_SITE_OTHER): Payer: Self-pay | Admitting: PSYCHIATRY

## 2020-11-11 ENCOUNTER — Other Ambulatory Visit (HOSPITAL_BASED_OUTPATIENT_CLINIC_OR_DEPARTMENT_OTHER): Payer: Self-pay | Admitting: PHYSICIAN ASSISTANT

## 2020-11-11 ENCOUNTER — Other Ambulatory Visit (INDEPENDENT_AMBULATORY_CARE_PROVIDER_SITE_OTHER): Payer: Self-pay | Admitting: PSYCHIATRY

## 2020-11-11 DIAGNOSIS — E039 Hypothyroidism, unspecified: Secondary | ICD-10-CM

## 2020-11-11 DIAGNOSIS — F331 Major depressive disorder, recurrent, moderate: Secondary | ICD-10-CM

## 2020-11-11 DIAGNOSIS — E119 Type 2 diabetes mellitus without complications: Secondary | ICD-10-CM

## 2020-11-11 DIAGNOSIS — F410 Panic disorder [episodic paroxysmal anxiety] without agoraphobia: Secondary | ICD-10-CM

## 2020-11-11 MED ORDER — AMLODIPINE 5 MG-OLMESARTAN 20 MG TABLET
ORAL_TABLET | ORAL | 1 refills | Status: DC
Start: 2020-11-11 — End: 2021-02-13

## 2020-11-11 MED ORDER — METFORMIN ER 750 MG TABLET,EXTENDED RELEASE 24 HR
ORAL_TABLET | ORAL | 0 refills | Status: DC
Start: 2020-11-11 — End: 2021-02-13

## 2020-11-11 MED ORDER — LEVOTHYROXINE 50 MCG TABLET
ORAL_TABLET | ORAL | 0 refills | Status: DC
Start: 2020-11-11 — End: 2021-02-13

## 2020-11-11 MED ORDER — DULOXETINE 60 MG CAPSULE,DELAYED RELEASE
60.0000 mg | DELAYED_RELEASE_CAPSULE | Freq: Every day | ORAL | 2 refills | Status: DC
Start: 2020-11-11 — End: 2020-11-12

## 2020-11-12 ENCOUNTER — Encounter (INDEPENDENT_AMBULATORY_CARE_PROVIDER_SITE_OTHER): Payer: Self-pay | Admitting: PSYCHIATRY

## 2020-11-12 ENCOUNTER — Ambulatory Visit (INDEPENDENT_AMBULATORY_CARE_PROVIDER_SITE_OTHER): Payer: Medicare PPO | Admitting: PSYCHIATRY

## 2020-11-12 ENCOUNTER — Other Ambulatory Visit: Payer: Self-pay

## 2020-11-12 DIAGNOSIS — F410 Panic disorder [episodic paroxysmal anxiety] without agoraphobia: Secondary | ICD-10-CM

## 2020-11-12 DIAGNOSIS — F331 Major depressive disorder, recurrent, moderate: Secondary | ICD-10-CM

## 2020-11-12 MED ORDER — CLONAZEPAM 0.5 MG TABLET
0.5000 mg | ORAL_TABLET | Freq: Three times a day (TID) | ORAL | 5 refills | Status: DC | PRN
Start: 2020-11-12 — End: 2021-04-08

## 2020-11-12 MED ORDER — DULOXETINE 60 MG CAPSULE,DELAYED RELEASE
60.0000 mg | DELAYED_RELEASE_CAPSULE | Freq: Every day | ORAL | 2 refills | Status: DC
Start: 2020-11-12 — End: 2021-04-08

## 2020-11-12 NOTE — Nursing Note (Signed)
11/12/20 1536   PHQ 9 (follow up)   Little interest or pleasure in doing things. 1   Feeling down, depressed, or hopeless 0   PHQ 2 Total 1   Trouble falling or staying asleep, or sleeping too much. 3   Feeling tired or having little energy 3   Poor appetite or overeating 3   Feeling bad about yourself/ that you are a failure in the past 2 weeks? 0   Trouble concentrating on things in the past 2 weeks? 0   Moving/Speaking slowly or being fidgety or restless  in the past 2 weeks? 0   Thoughts that you would be better off DEAD, or of hurting yourself in some way. 0   If you checked off any problems, how difficult have these problems made it for you to do your work, take care of things at home, or get along with other people? Somewhat difficult   PHQ 9 Total 10   Interpretation of Total Score Moderate depression

## 2020-11-12 NOTE — Nursing Note (Signed)
11/12/20 1537   [place] Suicide Severity Rating Scale (Since Last Contact Screener)   1. Wish to be Dead (Since Last Contact) N   2. Non-Specific Active Suicidal Thoughts (Since Last Contact) N   6. Suicidal Behavior (Since Last Contact) N   Calculated C-SSRS Risk Score (Since Last Contact) No Risk Indicated

## 2020-11-12 NOTE — Progress Notes (Signed)
[facility]   [address]  [place] [address]       Name: Elizabeth Page MRN:  [id number]   Date: 11/12/2020 Age: 75 y.o.       History:        Last visit:   05/14/2020    Interval History:  Has decided to move to [place] by the end of the year to be near son and his wife and granddaughter. In the meantime she is staying at the [facility].  Complains of mild depressive sx and quarantined herself for three months due to COVID and has no transportation and thinks that is the reason.     Doing well overall but admits she is frustrated in this area. No alcohol or drug use. Does take klonopin bid or tid for "nerves" but admits she needs new glasses and cannot read without them and misses that. Needs hearing aids etc but has no transportation.  No alcohol or drug use.      Substance use: Denies any substance use.    Review of systems:   Constitutional: Denies any medication side effect.  GI: Denies any diarrhea, constipation.  Dermatological:  Denies any skin rash    Medical and Surgical history in the interim: Non-contributory.  Past Medical History:   Diagnosis Date   . Anxiety    . Depression    . Diabetes mellitus, type 2 (CMS HCC)    . GERD (gastroesophageal reflux disease)    . History of concussion    . HTN (hypertension)    . Hyperlipidemia    . Stress    . Ulcerative colitis (CMS HCC)        Past Surgical History:   Procedure Laterality Date   . HX CESAREAN SECTION     . HX HYSTERECTOMY     . ORIF ANKLE FRACTURE     . TOTAL ABDOMINAL HYSTERECTOMY         Current Outpatient Medications   Medication Sig Dispense Refill   . ACCU-CHEK AVIVA CONTROL SOLN Solution      . ACCU-CHEK SOFTCLIX LANCETS Misc      . Amlodipine-Olmesartan 5-20 mg Oral Tablet TAKE 1 TABLET BY MOUTH ONCE A DAY 90 Tablet 1   . clonazePAM (KLONOPIN) 0.5 mg Oral Tablet Take 1 Tablet (0.5 mg total) by mouth Three times a day as needed for up to 180 days Indications: panic disorder 90 Tablet 5   .  DULoxetine (CYMBALTA DR) 60 mg Oral Capsule, Delayed Release(E.C.) Take 1 Capsule (60 mg total) by mouth Once a day for 180 days 90 Capsule 2   . levothyroxine (SYNTHROID) 50 mcg Oral Tablet TAKE 1 TABLET BY MOUTH EVERY MORNING ON AN EMPTY STOMACH 30-60 MINUTES BEFORE FOOD 90 Tablet 0   . metformin (GLUCOPHAGE-XR) 750 mg Oral Tablet Sustained Release 24 hr TAKE 1 TABLET BY MOUTH TWICE A DAY 180 Tablet 0   . multivitamin with iron Oral Tablet Take 1 Tablet by mouth Once a day       No current facility-administered medications for this visit.        Allergies:   Allergies   Allergen Reactions   . Allergy-Decongestant [Dexbrompheniramine-Pseudoephed]    . Codeine    . Cortisone    . Motrin [Ibuprofen]        Exam:      BP (!) 92/54   Ht 1.588 m (5' 2.5")   BMI 29.70 kg/m  General appearance: Well dressed, well groomed. Calm and pleasant.  Musculoskeletal: Normal gait and station. Normal muscle strength and tone.  Speech: Articulate, appropriate with spontaneous elaborations  Affect: Congruent with mood which is described as "fine".  Thought process: Logical, linear and goal directed.  Association: Intact  Abnormal thought: None and denies SI/HI or psychotic sx.  Judgement and Insight: Good.  Attention and concentration: Adequate.  Language: Fluent use of English.      Assessment: Diagnosis: unchanged.   Doing well overall.    Substance use is not a concern. No legal issues. No acute suicide risk. Discussed status and reviewed options for therapy. Discussed medications, psychotherapy and structures/schedule of program. Reviewed plan, goals, rationale, risks, benefits, and alternatives. Patient indicates understanding and agreement. Has decision making capacity.        ICD-10-CM    1. Moderate episode of recurrent major depressive disorder (CMS HCC)  F33.1 DULoxetine (CYMBALTA DR) 60 mg Oral Capsule, Delayed Release(E.C.)   2. Panic disorder  F41.0 clonazePAM (KLONOPIN) 0.5 mg Oral Tablet        Plan:    Medications: Continue current.  Therapy:  Continue current supportive work.  RTC: 5 months.  Labs/test/imagining: None    I spent over 25 minutes on this case today with the patient present over 66% of that time and we discussed her treatment plan, issues, medications, potential medication adverse effects and I obtained informed consent.  Over 50% of her time was spent on counseling, supportive psychotherapy and patient Education.      Latina [name], MD      This note was partially created using voice recognition software and is inherently subject to errors including those of syntax and "sound-alike" substitutions which may escape proofreading.  In such instances, original meaning may be extrapolated by contextual derivation.

## 2020-12-06 ENCOUNTER — Other Ambulatory Visit (INDEPENDENT_AMBULATORY_CARE_PROVIDER_SITE_OTHER): Payer: Self-pay | Admitting: PSYCHIATRY

## 2020-12-06 DIAGNOSIS — F410 Panic disorder [episodic paroxysmal anxiety] without agoraphobia: Secondary | ICD-10-CM

## 2021-01-24 ENCOUNTER — Other Ambulatory Visit (INDEPENDENT_AMBULATORY_CARE_PROVIDER_SITE_OTHER): Payer: Self-pay | Admitting: PSYCHIATRY

## 2021-01-24 DIAGNOSIS — F331 Major depressive disorder, recurrent, moderate: Secondary | ICD-10-CM

## 2021-02-10 ENCOUNTER — Encounter (HOSPITAL_BASED_OUTPATIENT_CLINIC_OR_DEPARTMENT_OTHER): Payer: Self-pay | Admitting: PHYSICIAN ASSISTANT

## 2021-02-13 ENCOUNTER — Other Ambulatory Visit (HOSPITAL_BASED_OUTPATIENT_CLINIC_OR_DEPARTMENT_OTHER): Payer: Self-pay | Admitting: PHYSICIAN ASSISTANT

## 2021-02-13 DIAGNOSIS — E039 Hypothyroidism, unspecified: Secondary | ICD-10-CM

## 2021-02-13 DIAGNOSIS — E119 Type 2 diabetes mellitus without complications: Secondary | ICD-10-CM

## 2021-03-10 ENCOUNTER — Encounter (HOSPITAL_BASED_OUTPATIENT_CLINIC_OR_DEPARTMENT_OTHER): Payer: Self-pay | Admitting: PHYSICIAN ASSISTANT

## 2021-04-08 ENCOUNTER — Other Ambulatory Visit: Payer: Self-pay

## 2021-04-08 ENCOUNTER — Ambulatory Visit (INDEPENDENT_AMBULATORY_CARE_PROVIDER_SITE_OTHER): Payer: Medicare PPO | Admitting: PSYCHIATRY

## 2021-04-08 ENCOUNTER — Encounter (INDEPENDENT_AMBULATORY_CARE_PROVIDER_SITE_OTHER): Payer: Self-pay | Admitting: PSYCHIATRY

## 2021-04-08 VITALS — BP 98/64 | HR 105 | Resp 16 | Ht 62.5 in | Wt 157.0 lb

## 2021-04-08 DIAGNOSIS — F410 Panic disorder [episodic paroxysmal anxiety] without agoraphobia: Secondary | ICD-10-CM

## 2021-04-08 DIAGNOSIS — F3341 Major depressive disorder, recurrent, in partial remission: Secondary | ICD-10-CM

## 2021-04-08 DIAGNOSIS — F331 Major depressive disorder, recurrent, moderate: Secondary | ICD-10-CM

## 2021-04-08 DIAGNOSIS — Z6828 Body mass index (BMI) 28.0-28.9, adult: Secondary | ICD-10-CM

## 2021-04-08 MED ORDER — CLONAZEPAM 0.5 MG TABLET
0.5000 mg | ORAL_TABLET | Freq: Three times a day (TID) | ORAL | 5 refills | Status: DC | PRN
Start: 2021-04-08 — End: 2021-05-14

## 2021-04-08 MED ORDER — DULOXETINE 60 MG CAPSULE,DELAYED RELEASE
60.0000 mg | DELAYED_RELEASE_CAPSULE | Freq: Every day | ORAL | 5 refills | Status: DC
Start: 2021-04-08 — End: 2021-11-12

## 2021-04-08 NOTE — Nursing Note (Signed)
04/08/21 1216   [place] Suicide Severity Rating Scale (Since Last Contact Screener)   1. Wish to be Dead (Since Last Contact) N   2. Non-Specific Active Suicidal Thoughts (Since Last Contact) N   6. Suicidal Behavior (Since Last Contact) N   Calculated C-SSRS Risk Score (Since Last Contact) No Risk Indicated

## 2021-04-08 NOTE — Nursing Note (Signed)
04/08/21 1217   PHQ 9 (follow up)   Little interest or pleasure in doing things. 0   Feeling down, depressed, or hopeless 0   PHQ 2 Total 0   Trouble falling or staying asleep, or sleeping too much. 3   Feeling tired or having little energy 3   Poor appetite or overeating 3   Feeling bad about yourself/ that you are a failure in the past 2 weeks? 0   Trouble concentrating on things in the past 2 weeks? 0   Moving/Speaking slowly or being fidgety or restless  in the past 2 weeks? 0   Thoughts that you would be better off DEAD, or of hurting yourself in some way. 0   If you checked off any problems, how difficult have these problems made it for you to do your work, take care of things at home, or get along with other people? Very difficult   PHQ 9 Total 9   Interpretation of Total Score Mild depression

## 2021-04-08 NOTE — Progress Notes (Signed)
[facility]   [address]  [place] [address]       Name: FELICITA NUNCIO MRN:  [id number]   Date: 04/08/2021 Age: 75 y.o.       History:        Last visit: 11/12/2020    Interval History:  "Terrible" as she has moved to [employer] and had new furniture and no they have bedbugs and they are biting her and fumigation has been ineffective. Is on a waiting list to move near son to [facility]. Building is full of young, loud people. May need to move to apartment in [place] temporarily.    Reports problems sleeping, low mood, stress level is high. Needs cataract removals soon. Needs to see ENT soon, dentures do not fit either-- many somatic complaints.    Has one friend who drives her around sometimes. Has contact with son who is important.    Some depressive sx reported such as low mood, problems with sleep, some anhedonia, tearfulness.  Denies death wishes or SI/HI.      Substance use: Denies any substance use.    Review of systems:   Constitutional: Denies any medication side effect.  GI: Denies any diarrhea, constipation.  Dermatological:  Denies any skin rash    Medical and Surgical history in the interim: Non-contributory.  Past Medical History:   Diagnosis Date   . Anxiety    . Depression    . Diabetes mellitus, type 2 (CMS HCC)    . GERD (gastroesophageal reflux disease)    . History of concussion    . HTN (hypertension)    . Hyperlipidemia    . Stress    . Ulcerative colitis (CMS HCC)        Past Surgical History:   Procedure Laterality Date   . HX CESAREAN SECTION     . HX HYSTERECTOMY     . ORIF ANKLE FRACTURE     . TOTAL ABDOMINAL HYSTERECTOMY         Current Outpatient Medications   Medication Sig Dispense Refill   . ACCU-CHEK AVIVA CONTROL SOLN Solution      . ACCU-CHEK SOFTCLIX LANCETS Misc      . Amlodipine-Olmesartan 5-20 mg Oral Tablet TAKE ONE TABLET BY MOUTH DAILY 90 Tablet 0   . clonazePAM (KLONOPIN) 0.5 mg Oral Tablet Take 1 Tablet (0.5 mg  total) by mouth Three times a day as needed for up to 180 days Indications: panic disorder 90 Tablet 5   . cyanocobalamin (VITAMIN B 12) 1,000 mcg Oral Tablet Take 1,000 mcg by mouth Once a day     . DULoxetine (CYMBALTA DR) 60 mg Oral Capsule, Delayed Release(E.C.) Take 1 Capsule (60 mg total) by mouth Once a day for 180 days 90 Capsule 2   . levothyroxine (SYNTHROID) 50 mcg Oral Tablet TAKE ONE TABLET BY MOUTH EVERY MORNING ON AN EMPTY STOMACH 30-60 MINUTES BEFORE FOOD 90 Tablet 0   . metformin (GLUCOPHAGE-XR) 750 mg Oral Tablet Sustained Release 24 hr TAKE ONE TABLET BY MOUTH TWICE A DAY 180 Tablet 0   . multivitamin with iron Oral Tablet Take 1 Tablet by mouth Once a day       No current facility-administered medications for this visit.        Allergies:   Allergies   Allergen Reactions   . Allergy-Decongestant [Dexbrompheniramine-Pseudoephed]    . Codeine    . Cortisone    . Motrin [Ibuprofen]  Exam:      BP 98/64   Pulse (!) 105   Resp 16   Ht 1.588 m (5' 2.5")   Wt 71.2 kg (157 lb)   SpO2 97%   BMI 28.26 kg/m       General appearance: Well dressed, well groomed. Cooperative. Good eye contact.  Musculoskeletal: Normal gait and station. Normal muscle strength and tone.  Speech: Articulate, appropriate with spontaneous elaborations  Affect: Congruent with mood which is described as "stressed".  Thought process: Logical, linear and goal directed.  Association: Intact  Abnormal thought: None and denies SI/HI or psychotic sx.  Judgement and Insight: Partial  Attention and concentration: Adequate  Language: Fluent use of English.    Assessment: Diagnosis: unchanged.  Illness is stable, life stressors are the issue.     Substance use is not a concern. No legal issues. No acute suicide risk. Discussed status and reviewed options for therapy. Discussed medications, psychotherapy and structures/schedule of program. Reviewed plan, goals, rationale, risks, benefits, and alternatives. Patient indicates  understanding and agreement. Has decision making capacity.        ICD-10-CM    1. Recurrent major depressive disorder, in partial remission (CMS HCC)  F33.41         Plan:   Medications: Continue current.  Therapy:  Continue current supportive work.  RTC: 5 months.  Labs/test/imagining: None  I spent approximately 20 minutes on this case today with the patient present over 66% of that time and we discussed her treatment plan, issues, medications, potential medication adverse effects  Including how Klonopin may impair her ability to operate machinery and may impair balance issues leading or more likely fall and I obtained informed consent.  Over 50% of her time was spent on counseling, supportive psychotherapy and patient Education.    Latina [name], MD      This note was partially created using voice recognition software and is inherently subject to errors including those of syntax and "sound-alike" substitutions which may escape proofreading.  In such instances, original meaning may be extrapolated by contextual derivation.

## 2021-04-14 ENCOUNTER — Encounter (HOSPITAL_BASED_OUTPATIENT_CLINIC_OR_DEPARTMENT_OTHER): Payer: Self-pay | Admitting: PHYSICIAN ASSISTANT

## 2021-04-14 ENCOUNTER — Ambulatory Visit: Payer: Medicare PPO | Attending: PHYSICIAN ASSISTANT | Admitting: PHYSICIAN ASSISTANT

## 2021-04-14 ENCOUNTER — Ambulatory Visit (HOSPITAL_BASED_OUTPATIENT_CLINIC_OR_DEPARTMENT_OTHER): Payer: Medicare PPO

## 2021-04-14 ENCOUNTER — Other Ambulatory Visit: Payer: Self-pay

## 2021-04-14 VITALS — BP 120/64 | HR 92 | Resp 18 | Ht 60.5 in | Wt 159.0 lb

## 2021-04-14 DIAGNOSIS — E1165 Type 2 diabetes mellitus with hyperglycemia: Secondary | ICD-10-CM | POA: Insufficient documentation

## 2021-04-14 DIAGNOSIS — I10 Essential (primary) hypertension: Secondary | ICD-10-CM | POA: Insufficient documentation

## 2021-04-14 DIAGNOSIS — K219 Gastro-esophageal reflux disease without esophagitis: Secondary | ICD-10-CM | POA: Insufficient documentation

## 2021-04-14 DIAGNOSIS — F39 Unspecified mood [affective] disorder: Secondary | ICD-10-CM | POA: Insufficient documentation

## 2021-04-14 DIAGNOSIS — Z0000 Encounter for general adult medical examination without abnormal findings: Secondary | ICD-10-CM | POA: Insufficient documentation

## 2021-04-14 DIAGNOSIS — F419 Anxiety disorder, unspecified: Secondary | ICD-10-CM | POA: Insufficient documentation

## 2021-04-14 LAB — CBC WITH DIFF
BASOPHIL #: 0.1 10*3/uL (ref 0.00–0.30)
BASOPHIL %: 1 % (ref 0–1)
EOSINOPHIL #: 0.2 10*3/uL (ref 0.00–0.50)
EOSINOPHIL %: 3 % (ref 0–4)
HCT: 36.1 % — ABNORMAL LOW (ref 37.0–47.0)
HGB: 11.6 g/dL — ABNORMAL LOW (ref 12.5–16.0)
LYMPHOCYTE #: 2.7 10*3/uL (ref 0.90–4.80)
LYMPHOCYTE %: 41 % — ABNORMAL HIGH (ref 23–35)
MCH: 23.2 pg — ABNORMAL LOW (ref 28.0–33.0)
MCHC: 32 g/dL (ref 32.0–37.0)
MCV: 72.4 fL — ABNORMAL LOW (ref 78.0–100.0)
MONOCYTE #: 0.7 10*3/uL (ref 0.30–0.90)
MONOCYTE %: 10 % (ref 0–12)
NEUTROPHIL #: 3 10*3/uL (ref 1.70–7.00)
NEUTROPHIL %: 46 % — ABNORMAL LOW (ref 50–70)
PLATELETS: 314 10*3/uL (ref 130–400)
RBC: 4.99 10*6/uL (ref 4.20–5.40)
RDW: 16.6 % — ABNORMAL HIGH (ref 9.9–16.5)
WBC: 6.7 10*3/uL (ref 4.5–11.0)

## 2021-04-14 LAB — COMPREHENSIVE METABOLIC PANEL, NON-FASTING
ALBUMIN: 4.1 g/dL (ref 3.4–4.8)
ALKALINE PHOSPHATASE: 74 U/L (ref 55–145)
ALT (SGPT): 13 U/L (ref 8–22)
ANION GAP: 10 mmol/L (ref 4–13)
AST (SGOT): 16 U/L (ref 8–45)
BILIRUBIN TOTAL: 0.5 mg/dL (ref 0.3–1.3)
BUN/CREA RATIO: 15 (ref 6–22)
BUN: 13 mg/dL (ref 8–25)
CALCIUM: 9.9 mg/dL (ref 8.8–10.2)
CHLORIDE: 100 mmol/L (ref 96–111)
CO2 TOTAL: 26 mmol/L (ref 23–31)
CREATININE: 0.89 mg/dL (ref 0.60–1.05)
ESTIMATED GFR: 68 mL/min/BSA (ref >=60)
GLUCOSE: 114 mg/dL (ref 65–125)
POTASSIUM: 4.2 mmol/L (ref 3.5–5.1)
PROTEIN TOTAL: 7.8 g/dL (ref 6.0–8.0)
SODIUM: 136 mmol/L (ref 136–145)

## 2021-04-14 LAB — VITAMIN D 25 TOTAL: VITAMIN D 25, TOTAL: 24.7 ng/mL — ABNORMAL LOW (ref 30.0–100.0)

## 2021-04-14 LAB — SCAN DIFFERENTIAL: WBC MORPHOLOGY COMMENT: NORMAL

## 2021-04-14 LAB — THYROID STIMULATING HORMONE WITH FREE T4 REFLEX: TSH: 1.726 u[IU]/mL (ref 0.430–3.550)

## 2021-04-14 NOTE — Progress Notes (Signed)
FAMILY MEDICINE, [facility] PROFESSIONAL BUILDING  [address]  [place] [address]  Operated by [facility]  Medicare Annual Wellness Visit    Name: Elizabeth Page MRN:  [id number]   Date: 04/14/2021 Age: 75 y.o.       SUBJECTIVE:   Elizabeth Page is a 75 y.o. female for presenting for Medicare Wellness exam.   I have reviewed and reconciled the medication list with the patient today.    Comprehensive Health Assessment:  Patient verbal responses recorded in flowsheet No flowsheet data found.      I have reviewed and updated as appropriate the past medical, family and social history. 04/14/2021 as summarized below:  Past Medical History:   Diagnosis Date   . Anxiety    . Depression    . Diabetes mellitus, type 2 (CMS HCC)    . GERD (gastroesophageal reflux disease)    . History of concussion    . HTN (hypertension)    . Hyperlipidemia    . Stress    . Ulcerative colitis (CMS HCC)      Past Surgical History:   Procedure Laterality Date   . Hx cesarean section     . Hx hysterectomy     . Orif ankle fracture     . Total abdominal hysterectomy       Current Outpatient Medications   Medication Sig   . ACCU-CHEK AVIVA CONTROL SOLN Solution    . ACCU-CHEK SOFTCLIX LANCETS Misc    . Amlodipine-Olmesartan 5-20 mg Oral Tablet TAKE ONE TABLET BY MOUTH DAILY   . clonazePAM (KLONOPIN) 0.5 mg Oral Tablet Take 1 Tablet (0.5 mg total) by mouth Three times a day as needed for up to 180 days Indications: panic disorder   . cyanocobalamin (VITAMIN B 12) 1,000 mcg Oral Tablet Take 1,000 mcg by mouth Once a day   . DULoxetine (CYMBALTA DR) 60 mg Oral Capsule, Delayed Release(E.C.) Take 1 Capsule (60 mg total) by mouth Once a day for 180 days Indications: major depressive disorder   . levothyroxine (SYNTHROID) 50 mcg Oral Tablet TAKE ONE TABLET BY MOUTH EVERY MORNING ON AN EMPTY STOMACH 30-60 MINUTES BEFORE FOOD   . metformin (GLUCOPHAGE-XR) 750 mg Oral Tablet Sustained Release 24 hr TAKE ONE TABLET BY MOUTH TWICE A  DAY   . naproxen Sodium (ALEVE) 220 mg Oral Tablet Take 220 mg by mouth Twice daily with food     Family Medical History:     Problem Relation (Age of Onset)    Other Mother, Father          Social History     Socioeconomic History   . Marital status: Widowed   Tobacco Use   . Smoking status: Never Smoker   . Smokeless tobacco: Never Used   Substance and Sexual Activity   . Alcohol use: No   . Drug use: Never     Review of Systems: Pertinent items are noted in HPI.     List of Current Health Care Providers   Care Team     PCP     Name Type Specialty Phone Number    Karolee Stamps Physician Assistant PHYSICIAN ASSISTANT [phone]          Care Team     No care team found                  Health Maintenance   Topic Date Due   . Diabetic Retinal Exam  Never done   . Osteoporosis screening  Never done   . Hepatitis C screening  Never done   . Covid-19 Vaccine (1) Never done   . Pneumococcal Vaccination, Age 68+ (1 - PCV) Never done   . Adult Tdap-Td (1 - Tdap) Never done   . Colonoscopy  Never done   . Mammography  Never done   . Shingles Vaccine (1 of 2) Never done   . Diabetes A1C  11/27/2017   . Influenza Vaccine (Season Ended) 06/18/2021   . Annual Wellness Visit  04/14/2022   . Meningococcal Vaccine  Aged Out     Medicare Wellness Assessment   Medicare initial or wellness physical in the last year?: No  Advance Directives (optional)   Does patient have a living will or MPOA: no   Has patient provided [employer] with a copy?: no   Advance directive information given to the patient today?: no      Activities of Daily Living   Do you need help with dressing, bathing, or walking?: No   Do you need help with shopping, housekeeping, medications, or finances?: No   Do you have rugs in hallways, broken steps, or poor lighting?: No   Do you have grab bars in your bathroom, non-slip strips in your tub, and hand rails on your stairs?: Yes   Urinary Incontinence Screen (Women >=65 only)   Do you ever leak urine  when you don't want to?: No   Cognitive Function Screen (1=Yes, 0=No)   What is you age?: Correct   What is the time to the nearest hour?: Correct   What is the year?: Correct   What is the name of this clinic?: Correct   Can the patient recognize two persons (the doctor, the nurse, home help, etc.)?: Correct   What is the date of your birth? (day and month sufficient) : Correct   In what year did World War II end?: Incorrect   Who is the current president of the [place]?: Correct   Count from 20 down to 1?: Correct   What address did I give you earlier?: Correct   Total Score: 9       Hearing Screen   Have you noticed any hearing difficulties?: Yes  After whispering 9-1-6 how many numbers did the patient repeat correctly?: 3   Fall Risk Screen   Do you feel unsteady when standing or walking?: Yes  Do you worry about falling?: No  Have you fallen in the past year?: No  Timed up and go test (in seconds): 15   Vision Screen   Right Eye = 20: 40   Left Eye = 20: 40   Depression Screen   Little interest or pleasure in doing things.: Several Days  Feeling down, depressed, or hopeless: Several Days  PHQ 2 Total: 2        OBJECTIVE:   BP 120/64 (Site: Left, Patient Position: Sitting, Cuff Size: Adult)   Pulse 92   Resp 18   Ht 1.537 m (5' 0.5")   Wt 72.1 kg (159 lb)   SpO2 98%   BMI 30.54 kg/m        Other appropriate exam:    Health Maintenance Due   Topic Date Due   . Diabetic Retinal Exam  Never done   . Osteoporosis screening  Never done   . Hepatitis C screening  Never done   . Covid-19 Vaccine (1) Never done   . Pneumococcal Vaccination,  Age 39+ (1 - PCV) Never done   . Adult Tdap-Td (1 - Tdap) Never done   . Colonoscopy  Never done   . Mammography  Never done   . Shingles Vaccine (1 of 2) Never done   . Diabetes A1C  11/27/2017      ASSESSMENT & PLAN:   1. Encounter for Medicare annual wellness exam    2. Type 2 diabetes mellitus with hyperglycemia, without long-term current use of insulin (CMS HCC)     3. Mood disorder (CMS HCC)    4. Anxiety    5. Essential hypertension    6. GERD without esophagitis       Identified Risk Factors/ Recommended Actions     Fall Risk Follow up plan of care: Discussed optimizing home safety  The PHQ 2 Total: 2 depression screen is interpreted as negative.    Orders Placed This Encounter   . CBC/DIFF   . Comp Metabolic Panel-Non Fasting   . VITAMIN D 25 TOTAL   . TSH w/ Free T4 Reflex   . Hemoglobin A1C          The patient has been educated about risk factors and recommended preventive care. Written Prevention Plan completed/ updated and given to patient (see After Visit Summary).  DEFERS MAMMOGRAM/COLONSCOPY.  SEES PSYCH REGULARLY.  Return in about 6 months (around 10/14/2021), or if symptoms worsen or fail to improve.    Karolee Stamps  FAMILY MEDICINE, [facility] BUILDING  [address]  [place] [address]  Phone: [phone]  Fax: [phone]

## 2021-04-14 NOTE — Nursing Note (Signed)
Here for annual medicare wellness examination.

## 2021-04-14 NOTE — Nursing Note (Signed)
04/14/21 1000   Medicare Wellness Assessment   Medicare initial or wellness physical in the last year? No   Advance Directives   Does patient have a living will or MPOA no   Has patient provided [employer] with a copy? no   Advance directive information given to the patient today? no   Activities of Daily Living   Do you need help with dressing, bathing, or walking? No   Do you need help with shopping, housekeeping, medications, or finances? No   Do you have rugs in hallways, broken steps, or poor lighting? No   Do you have grab bars in your bathroom, non-slip strips in your tub, and hand rails on your stairs? Yes   Urinary Incontinence Screen-Women only   Do you ever leak urine when you don't want to? No   Cognitive Function Screen   What is you age? 1   What is the time to the nearest hour? 1   What is the year? 1   What is the name of this clinic? 1   Can the patient recognize two persons (the doctor, the nurse, home help, etc.)? 1   What is the date of your birth? (day and month sufficient)  1   In what year did World War II end? 0   Who is the current president of the [place]? 1   Count from 20 down to 1? 1   What address did I give you earlier? 1   Total Score 9   Depression Screen   Little interest or pleasure in doing things. 1   Feeling down, depressed, or hopeless 1   PHQ 2 Total 2   Hearing Screen   Have you noticed any hearing difficulties? Yes   After whispering 9-1-6 how many numbers did the patient repeat correctly? 3   Fall Risk Assessment   Do you feel unsteady when standing or walking? Yes   Do you worry about falling? No   Have you fallen in the past year? No   Timed up and go test (in seconds) 15   Vision Screen   Right Eye = 20 40   Left Eye = 20 40

## 2021-04-15 LAB — HGA1C (HEMOGLOBIN A1C WITH EST AVG GLUCOSE)
ESTIMATED AVERAGE GLUCOSE: 143 mg/dL (ref 0–153)
HEMOGLOBIN A1C: 6.6 % (ref <=7.0)

## 2021-04-15 NOTE — Result Encounter Note (Signed)
unable to leave message, voicemail not set up  per Elizabeth Page, please let patient know she needs to start vit d 2000 iu daily.  all other labs look good.

## 2021-04-17 ENCOUNTER — Other Ambulatory Visit (HOSPITAL_BASED_OUTPATIENT_CLINIC_OR_DEPARTMENT_OTHER): Payer: Self-pay | Admitting: PHYSICIAN ASSISTANT

## 2021-04-17 DIAGNOSIS — N898 Other specified noninflammatory disorders of vagina: Secondary | ICD-10-CM

## 2021-04-17 NOTE — Result Encounter Note (Signed)
second attempt to contact patient with results.  unable to leave message, no voicemail set up and mobile number not correct

## 2021-05-01 ENCOUNTER — Telehealth (HOSPITAL_BASED_OUTPATIENT_CLINIC_OR_DEPARTMENT_OTHER): Payer: Self-pay | Admitting: PHYSICIAN ASSISTANT

## 2021-05-01 NOTE — Nursing Note (Signed)
Pt called into the office for lab results. I attempted to call patient back twice. Pt hangs up phone when call is answered.

## 2021-05-14 ENCOUNTER — Other Ambulatory Visit (INDEPENDENT_AMBULATORY_CARE_PROVIDER_SITE_OTHER): Payer: Self-pay | Admitting: PSYCHIATRY

## 2021-05-14 DIAGNOSIS — F410 Panic disorder [episodic paroxysmal anxiety] without agoraphobia: Secondary | ICD-10-CM

## 2021-05-14 NOTE — Telephone Encounter (Signed)
Pharmacy called asking for a new script.  Patient had it transferred to [facility] but they will not deliver it to it her.  So it needs to be sent back in to [place].

## 2021-07-08 ENCOUNTER — Other Ambulatory Visit (HOSPITAL_BASED_OUTPATIENT_CLINIC_OR_DEPARTMENT_OTHER): Payer: Self-pay | Admitting: PHYSICIAN ASSISTANT

## 2021-07-08 DIAGNOSIS — E119 Type 2 diabetes mellitus without complications: Secondary | ICD-10-CM

## 2021-08-08 ENCOUNTER — Other Ambulatory Visit (HOSPITAL_BASED_OUTPATIENT_CLINIC_OR_DEPARTMENT_OTHER): Payer: Self-pay | Admitting: PHYSICIAN ASSISTANT

## 2021-08-13 ENCOUNTER — Other Ambulatory Visit (INDEPENDENT_AMBULATORY_CARE_PROVIDER_SITE_OTHER): Payer: Self-pay | Admitting: PSYCHIATRY

## 2021-08-13 DIAGNOSIS — F410 Panic disorder [episodic paroxysmal anxiety] without agoraphobia: Secondary | ICD-10-CM

## 2021-09-04 ENCOUNTER — Other Ambulatory Visit (INDEPENDENT_AMBULATORY_CARE_PROVIDER_SITE_OTHER): Payer: Self-pay

## 2021-09-04 DIAGNOSIS — Z7189 Other specified counseling: Secondary | ICD-10-CM

## 2021-09-04 NOTE — Nursing Note (Signed)
Population Health Management   Clinical Quality Coordination  Quality Chart Review  DATE: 09/04/2021, 11:23  PATIENT NAME: Elizabeth Page  MRN: [id number]  DOB: [birth year]  PCP: [name], PA-C  INSURANCE: Payor: AETNA-MEDI-ADV / Plan: [employer] MEDICARE ADVANTAGE PPO / Product Type: PPO /     REASON FOR ENCOUNTER:     Chart review completed to review open quality gaps, and offer suggestions for gap closure. Patient has an open gap for Statin Use for Persons with Diabetes (SUPD) and/or Statin Use for Persons with Cardiovascular Disease. By including one of the following codes to an upcoming or previous visit the patient may be excluded from this quality measure.     Suggestion:   This patient has an upcoming appointment on 10/16/21. Patient is not able to tolerate statins per chart review. 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     We appreciate the consideration, and respect the providers determination!     Kym Groom  09/04/2021, 11:23  Clinical Quality Coordinator, Population Health

## 2021-10-05 ENCOUNTER — Encounter (INDEPENDENT_AMBULATORY_CARE_PROVIDER_SITE_OTHER): Payer: Self-pay | Admitting: PSYCHIATRY

## 2021-10-07 ENCOUNTER — Other Ambulatory Visit (INDEPENDENT_AMBULATORY_CARE_PROVIDER_SITE_OTHER): Payer: Self-pay | Admitting: PSYCHIATRY

## 2021-10-07 ENCOUNTER — Other Ambulatory Visit (HOSPITAL_BASED_OUTPATIENT_CLINIC_OR_DEPARTMENT_OTHER): Payer: Self-pay | Admitting: PHYSICIAN ASSISTANT

## 2021-10-07 DIAGNOSIS — E039 Hypothyroidism, unspecified: Secondary | ICD-10-CM

## 2021-10-07 DIAGNOSIS — F410 Panic disorder [episodic paroxysmal anxiety] without agoraphobia: Secondary | ICD-10-CM

## 2021-10-07 DIAGNOSIS — E119 Type 2 diabetes mellitus without complications: Secondary | ICD-10-CM

## 2021-10-16 ENCOUNTER — Ambulatory Visit (HOSPITAL_BASED_OUTPATIENT_CLINIC_OR_DEPARTMENT_OTHER): Payer: Self-pay | Admitting: PHYSICIAN ASSISTANT

## 2021-10-16 ENCOUNTER — Encounter (INDEPENDENT_AMBULATORY_CARE_PROVIDER_SITE_OTHER): Payer: Medicare PPO | Admitting: PSYCHIATRY

## 2021-11-02 ENCOUNTER — Encounter (HOSPITAL_BASED_OUTPATIENT_CLINIC_OR_DEPARTMENT_OTHER): Payer: Self-pay | Admitting: PHYSICIAN ASSISTANT

## 2021-11-02 ENCOUNTER — Ambulatory Visit: Payer: Medicare PPO | Attending: PHYSICIAN ASSISTANT | Admitting: PHYSICIAN ASSISTANT

## 2021-11-02 ENCOUNTER — Other Ambulatory Visit: Payer: Self-pay

## 2021-11-02 VITALS — BP 110/62 | HR 98 | Ht 60.5 in | Wt 163.0 lb

## 2021-11-02 DIAGNOSIS — E1165 Type 2 diabetes mellitus with hyperglycemia: Secondary | ICD-10-CM | POA: Insufficient documentation

## 2021-11-02 DIAGNOSIS — Z79899 Other long term (current) drug therapy: Secondary | ICD-10-CM

## 2021-11-02 DIAGNOSIS — Z7984 Long term (current) use of oral hypoglycemic drugs: Secondary | ICD-10-CM

## 2021-11-02 DIAGNOSIS — I10 Essential (primary) hypertension: Secondary | ICD-10-CM | POA: Insufficient documentation

## 2021-11-02 DIAGNOSIS — F419 Anxiety disorder, unspecified: Secondary | ICD-10-CM | POA: Insufficient documentation

## 2021-11-02 DIAGNOSIS — K219 Gastro-esophageal reflux disease without esophagitis: Secondary | ICD-10-CM | POA: Insufficient documentation

## 2021-11-02 DIAGNOSIS — E538 Deficiency of other specified B group vitamins: Secondary | ICD-10-CM | POA: Insufficient documentation

## 2021-11-02 DIAGNOSIS — F39 Unspecified mood [affective] disorder: Secondary | ICD-10-CM | POA: Insufficient documentation

## 2021-11-02 MED ORDER — CYANOCOBALAMIN (VIT B-12) 1,000 MCG/ML INJECTION SOLUTION
1000.0000 ug | INTRAMUSCULAR | Status: AC
Start: 2021-11-02 — End: 2021-11-02
  Administered 2021-11-02: 1000 ug via SUBCUTANEOUS

## 2021-11-02 NOTE — Progress Notes (Signed)
FAMILY MEDICINE, [place] BUILDING  [place] [address]  Phone: [phone]  Fax: [phone]    Encounter Date: 11/02/2021    Patient ID:  Elizabeth Page  [id number]    DOB: 07-Mar-1946  Age: 76 y.o. female    Subjective:     Chief Complaint   Patient presents with   . Follow Up     here for a follow up.  she is doing well.  follows with psych.    The history is provided by the patient.     Current Outpatient Medications   Medication Sig   . ACCU-CHEK AVIVA CONTROL SOLN Solution    . ACCU-CHEK SOFTCLIX LANCETS Misc    . Amlodipine-Olmesartan 5-20 mg Oral Tablet TAKE ONE TABLET BY MOUTH DAILY   . clonazePAM (KLONOPIN) 0.5 mg Oral Tablet TAKE ONE TABLET BY MOUTH THREE TIMES A DAY AS NEEDED   . cyanocobalamin (VITAMIN B 12) 1,000 mcg Oral Tablet Take 1 Tablet (1,000 mcg total) by mouth Once a day   . DULoxetine (CYMBALTA DR) 60 mg Oral Capsule, Delayed Release(E.C.) Take 1 Capsule (60 mg total) by mouth Once a day for 180 days Indications: major depressive disorder   . levothyroxine (SYNTHROID) 50 mcg Oral Tablet TAKE ONE TABLET BY MOUTH IN THE MORNING ON AN EMPTY STOMACH 30-60 MINUTES BEFORE FOOD   . metformin (GLUCOPHAGE-XR) 750 mg Oral Tablet Sustained Release 24 hr TAKE ONE TABLET BY MOUTH TWICE A DAY   . naproxen Sodium (ALEVE) 220 mg Oral Tablet Take 1 Tablet (220 mg total) by mouth Twice daily with food     Allergies   Allergen Reactions   . Allergy-Decongestant [Dexbrompheniramine-Pseudoephed]    . Codeine    . Cortisone    . Motrin [Ibuprofen]      Past Medical History:   Diagnosis Date   . Anxiety    . Depression    . Diabetes mellitus, type 2 (CMS HCC)    . GERD (gastroesophageal reflux disease)    . History of concussion    . HTN (hypertension)    . Hyperlipidemia    . Stress    . Ulcerative colitis (CMS [place])          Past Surgical History:   Procedure Laterality Date   . HX CESAREAN SECTION     . HX HYSTERECTOMY     . ORIF ANKLE FRACTURE     . TOTAL ABDOMINAL  HYSTERECTOMY           Family Medical History:     Problem Relation (Age of Onset)    Other Mother, Father          Social History     Tobacco Use   . Smoking status: Never   . Smokeless tobacco: Never   Substance Use Topics   . Alcohol use: No   . Drug use: Never       Review of Systems   Constitutional: Negative.    HENT: Negative.    Eyes: Negative.    Respiratory: Negative.    Cardiovascular: Negative.    Gastrointestinal: Negative.    Endocrine: Negative.    Genitourinary: Negative.    Musculoskeletal: Positive for arthralgias.   Skin: Negative.    Allergic/Immunologic: Negative.    Neurological: Negative.    Hematological: Negative.    Psychiatric/Behavioral: Negative for self-injury, sleep disturbance and suicidal ideas. The patient is nervous/anxious.      Objective:   Vitals: BP 110/62  Pulse 98   Ht 1.537 m (5' 0.5")   Wt 73.9 kg (163 lb)   SpO2 98%   BMI 31.31 kg/m         Physical Exam  Vitals and nursing note reviewed.   Constitutional:       Appearance: She is well-developed.   HENT:      Head: Normocephalic and atraumatic.   Eyes:      Conjunctiva/sclera: Conjunctivae normal.      Pupils: Pupils are equal, round, and reactive to light.   Cardiovascular:      Rate and Rhythm: Normal rate and regular rhythm.      Heart sounds: Normal heart sounds. No murmur heard.    No friction rub. No gallop.   Pulmonary:      Effort: Pulmonary effort is normal.      Breath sounds: Normal breath sounds.   Abdominal:      General: Bowel sounds are normal.      Palpations: Abdomen is soft.   Musculoskeletal:         General: Normal range of motion.      Cervical back: Normal range of motion and neck supple.   Skin:     General: Skin is warm and dry.   Neurological:      Mental Status: She is alert and oriented to person, place, and time.      Deep Tendon Reflexes: Reflexes are normal and symmetric.   Psychiatric:         Behavior: Behavior normal.         Thought Content: Thought content normal.         Judgment:  Judgment normal.         Assessment & Plan:     ENCOUNTER DIAGNOSES     ICD-10-CM   1. Type 2 diabetes mellitus with hyperglycemia, without long-term current use of insulin (CMS HCC)  E11.65   2. Mood disorder (CMS HCC)  F39   3. B12 deficiency  E53.8   4. Essential hypertension  I10   5. GERD without esophagitis  K21.9   6. Anxiety  F41.9       Orders Placed This Encounter   . CBC/DIFF   . Comp Metabolic Panel-Fasting   . TSH w/ Free T4 Reflex   . Lipid Panel   . Hemoglobin A1C   . cyanocobalamin (VITAMIN B12) 1000 mcg/mL injection       Return in about 6 months (around 05/02/2022), or if symptoms worsen or fail to improve.    [name], PA-C

## 2021-11-02 NOTE — Nursing Note (Signed)
Here today for a 6 month follow up.    Margery Szostak, Ambulatory Care Assistant

## 2021-11-04 ENCOUNTER — Encounter (INDEPENDENT_AMBULATORY_CARE_PROVIDER_SITE_OTHER): Payer: Medicare PPO | Admitting: PSYCHIATRY

## 2021-11-09 ENCOUNTER — Ambulatory Visit (HOSPITAL_COMMUNITY): Payer: Self-pay

## 2021-11-12 ENCOUNTER — Other Ambulatory Visit: Payer: Self-pay

## 2021-11-12 ENCOUNTER — Encounter (INDEPENDENT_AMBULATORY_CARE_PROVIDER_SITE_OTHER): Payer: Self-pay | Admitting: PSYCHIATRY

## 2021-11-12 ENCOUNTER — Ambulatory Visit (INDEPENDENT_AMBULATORY_CARE_PROVIDER_SITE_OTHER): Payer: Medicare PPO | Admitting: PSYCHIATRY

## 2021-11-12 VITALS — BP 118/68 | HR 88 | Resp 18 | Ht 62.0 in | Wt 162.0 lb

## 2021-11-12 DIAGNOSIS — F3341 Major depressive disorder, recurrent, in partial remission: Secondary | ICD-10-CM

## 2021-11-12 DIAGNOSIS — F410 Panic disorder [episodic paroxysmal anxiety] without agoraphobia: Secondary | ICD-10-CM

## 2021-11-12 DIAGNOSIS — F331 Major depressive disorder, recurrent, moderate: Secondary | ICD-10-CM

## 2021-11-12 MED ORDER — DULOXETINE 60 MG CAPSULE,DELAYED RELEASE
60.0000 mg | DELAYED_RELEASE_CAPSULE | Freq: Every day | ORAL | 5 refills | Status: DC
Start: 2021-11-12 — End: 2021-12-15

## 2021-11-12 MED ORDER — CLONAZEPAM 0.5 MG TABLET
0.5000 mg | ORAL_TABLET | Freq: Three times a day (TID) | ORAL | 5 refills | Status: DC | PRN
Start: 2021-11-12 — End: 2021-11-16

## 2021-11-12 NOTE — Nursing Note (Signed)
11/12/21 1432   PHQ 9 (follow up)   Little interest or pleasure in doing things. 0   Feeling down, depressed, or hopeless 1   PHQ 2 Total 1   Trouble falling or staying asleep, or sleeping too much. 3   Feeling tired or having little energy 3   Poor appetite or overeating 2   Feeling bad about yourself/ that you are a failure in the past 2 weeks? 0   Trouble concentrating on things in the past 2 weeks? 3   Moving/Speaking slowly or being fidgety or restless  in the past 2 weeks? 0   Thoughts that you would be better off DEAD, or of hurting yourself in some way. 0   If you checked off any problems, how difficult have these problems made it for you to do your work, take care of things at home, or get along with other people? Somewhat difficult   PHQ 9 Total 12   Interpretation of Total Score Moderate depression

## 2021-11-12 NOTE — Progress Notes (Signed)
BEHAVIORAL MEDICINE, [place] ADULT AND ADOLESCENT PSYCHIATRY   [place] [address]       Name: Elizabeth Page MRN:  [id number]   Date: 11/12/2021 Age: 76 y.o.       History:        Last visit: 04/08/2021    Interval History:  Does not like the [employer] as there are young drug addicts in there.  Complains of hand pains and has work up is pending. Dwells on husband's suicide in 2003 but they had been separated for years. Is distanced from sister who is nasty and slept with the patient's husband.  Sees brother at times.    Put application in in [place] for an apartment to be closer to son. I told her that was a good idea.    No sustained depressive sx.  Rare panic attacks.    Substance use: Denies any substance use.    Review of systems:   Constitutional: Denies any medication side effect.  GI: Denies any diarrhea, constipation.  Dermatological:  Denies any skin rash    Medical and Surgical history in the interim: Non-contributory.  Past Medical History:   Diagnosis Date   . Anxiety    . Depression    . Diabetes mellitus, type 2 (CMS HCC)    . GERD (gastroesophageal reflux disease)    . History of concussion    . HTN (hypertension)    . Hyperlipidemia    . Stress    . Ulcerative colitis (CMS [place])        Past Surgical History:   Procedure Laterality Date   . HX CESAREAN SECTION     . HX HYSTERECTOMY     . ORIF ANKLE FRACTURE     . TOTAL ABDOMINAL HYSTERECTOMY         Current Outpatient Medications   Medication Sig Dispense Refill   . ACCU-CHEK AVIVA CONTROL SOLN Solution      . ACCU-CHEK SOFTCLIX LANCETS Misc      . Amlodipine-Olmesartan 5-20 mg Oral Tablet TAKE ONE TABLET BY MOUTH DAILY 90 Tablet 0   . clonazePAM (KLONOPIN) 0.5 mg Oral Tablet TAKE ONE TABLET BY MOUTH THREE TIMES A DAY AS NEEDED 90 Tablet 0   . cyanocobalamin (VITAMIN B 12) 1,000 mcg Oral Tablet Take 1 Tablet (1,000 mcg total) by mouth Once a day     . DULoxetine (CYMBALTA DR) 60 mg Oral Capsule, Delayed Release(E.C.) Take 1 Capsule  (60 mg total) by mouth Once a day for 180 days Indications: major depressive disorder 30 Capsule 5   . levothyroxine (SYNTHROID) 50 mcg Oral Tablet TAKE ONE TABLET BY MOUTH IN THE MORNING ON AN EMPTY STOMACH 30-60 MINUTES BEFORE FOOD (Patient not taking: Reported on 11/12/2021) 60 Tablet 0   . metformin (GLUCOPHAGE-XR) 750 mg Oral Tablet Sustained Release 24 hr TAKE ONE TABLET BY MOUTH TWICE A DAY 180 Tablet 0   . naproxen Sodium (ALEVE) 220 mg Oral Tablet Take 1 Tablet (220 mg total) by mouth Twice daily with food       No current facility-administered medications for this visit.        Allergies:   Allergies   Allergen Reactions   . Allergy-Decongestant [Dexbrompheniramine-Pseudoephed]    . Codeine    . Cortisone    . Motrin [Ibuprofen]        Exam:      BP 118/68   Pulse 88   Resp 18   Ht 1.575  m (5\' 2" )   Wt 73.5 kg (162 lb)   SpO2 97%   BMI 29.63 kg/m       General appearance: Well dressed, well groomed. Calm and pleasant.  Musculoskeletal: Normal gait and station. Normal muscle strength and tone.  Speech: Articulate, appropriate with spontaneous elaborations  Affect: Congruent with mood which is described as "fine I guess".  Thought process: Logical, linear and goal directed.  Association: Intact  Abnormal thought: None and denies SI/HI or psychotic sx.  Judgement and Insight: Good.  Attention and concentration: Good.  Language: Fluent use of English.    Assessment: Diagnosis: unchanged.  Psychiatrically stable.     Substance use is not a concern. No legal issues. No acute suicide risk. Discussed status and reviewed options for therapy. Discussed medications, psychotherapy and structures/schedule of program. Reviewed plan, goals, rationale, risks, benefits, and alternatives. Patient indicates understanding and agreement. Has decision making capacity.        ICD-10-CM    1. Recurrent major depressive disorder, in partial remission (CMS HCC)  F33.41       2. Panic disorder  F41.0            Plan:    Medications: Continue current.  Therapy:  Continue current supportive work.  RTC: 6 months.  Labs/test/imagining: None indicated.  I spent approximately 24 minutes on this case today with the patient present over 66% of that time and we discussed her treatment plan, issues, medications, potential medication adverse effects and I obtained informed consent.  Over 50% of her time was spent on counseling, supportive psychotherapy and patient Education.    Awanda Mink, MD      This note was partially created using voice recognition software and is inherently subject to errors including those of syntax and "sound-alike" substitutions which may escape proofreading.  In such instances, original meaning may be extrapolated by contextual derivation.

## 2021-11-12 NOTE — Nursing Note (Signed)
11/12/21 1432   [place] Suicide Severity Rating Scale (Since Last Contact Screener)   1. Wish to be Dead (Since Last Contact) N   2. Non-Specific Active Suicidal Thoughts (Since Last Contact) N   6. Suicidal Behavior (Since Last Contact) N   Calculated C-SSRS Risk Score (Since Last Contact) No Risk Indicated

## 2021-11-16 ENCOUNTER — Other Ambulatory Visit (INDEPENDENT_AMBULATORY_CARE_PROVIDER_SITE_OTHER): Payer: Self-pay | Admitting: PSYCHIATRY

## 2021-11-16 DIAGNOSIS — F410 Panic disorder [episodic paroxysmal anxiety] without agoraphobia: Secondary | ICD-10-CM

## 2021-11-16 MED ORDER — CLONAZEPAM 0.5 MG TABLET
0.5000 mg | ORAL_TABLET | Freq: Three times a day (TID) | ORAL | 5 refills | Status: DC | PRN
Start: 2021-11-16 — End: 2022-05-12

## 2021-11-16 NOTE — Telephone Encounter (Signed)
Please resend to this phamacy d/t inventory issues.

## 2021-12-03 ENCOUNTER — Other Ambulatory Visit (HOSPITAL_BASED_OUTPATIENT_CLINIC_OR_DEPARTMENT_OTHER): Payer: Self-pay | Admitting: PHYSICIAN ASSISTANT

## 2021-12-03 MED ORDER — AMLODIPINE 5 MG-OLMESARTAN 20 MG TABLET
1.0000 | ORAL_TABLET | Freq: Every day | ORAL | 0 refills | Status: DC
Start: 2021-12-03 — End: 2022-06-03

## 2021-12-04 ENCOUNTER — Ambulatory Visit (HOSPITAL_COMMUNITY): Payer: Self-pay

## 2021-12-15 ENCOUNTER — Other Ambulatory Visit (HOSPITAL_BASED_OUTPATIENT_CLINIC_OR_DEPARTMENT_OTHER): Payer: Self-pay | Admitting: PHYSICIAN ASSISTANT

## 2021-12-15 DIAGNOSIS — F331 Major depressive disorder, recurrent, moderate: Secondary | ICD-10-CM

## 2021-12-15 MED ORDER — DULOXETINE 60 MG CAPSULE,DELAYED RELEASE
60.0000 mg | DELAYED_RELEASE_CAPSULE | Freq: Every day | ORAL | 5 refills | Status: DC
Start: 2021-12-15 — End: 2022-05-12

## 2021-12-28 ENCOUNTER — Other Ambulatory Visit (HOSPITAL_BASED_OUTPATIENT_CLINIC_OR_DEPARTMENT_OTHER): Payer: Self-pay | Admitting: PHYSICIAN ASSISTANT

## 2021-12-28 MED ORDER — LEVOTHYROXINE 50 MCG TABLET
50.0000 ug | ORAL_TABLET | Freq: Every morning | ORAL | 0 refills | Status: DC
Start: 2021-12-28 — End: 2022-03-22

## 2021-12-28 NOTE — Telephone Encounter (Signed)
synthroid °

## 2022-01-05 ENCOUNTER — Other Ambulatory Visit (HOSPITAL_BASED_OUTPATIENT_CLINIC_OR_DEPARTMENT_OTHER): Payer: Self-pay | Admitting: PHYSICIAN ASSISTANT

## 2022-01-05 DIAGNOSIS — E119 Type 2 diabetes mellitus without complications: Secondary | ICD-10-CM

## 2022-01-05 MED ORDER — METFORMIN ER 750 MG TABLET,EXTENDED RELEASE 24 HR
750.0000 mg | ORAL_TABLET | Freq: Two times a day (BID) | ORAL | 0 refills | Status: DC
Start: 2022-01-05 — End: 2022-04-06

## 2022-01-25 ENCOUNTER — Telehealth (HOSPITAL_BASED_OUTPATIENT_CLINIC_OR_DEPARTMENT_OTHER): Payer: Self-pay | Admitting: PHYSICIAN ASSISTANT

## 2022-01-25 ENCOUNTER — Other Ambulatory Visit: Payer: Medicare PPO | Attending: PHYSICIAN ASSISTANT

## 2022-01-25 DIAGNOSIS — E1165 Type 2 diabetes mellitus with hyperglycemia: Secondary | ICD-10-CM | POA: Insufficient documentation

## 2022-01-25 NOTE — Nursing Note (Signed)
stopped in the office after getting her labs done, wanted to know if labs for " the change of life can be done, estorgen, she gets hot flashes so bad her head is all wet "

## 2022-01-26 LAB — HGA1C (HEMOGLOBIN A1C WITH EST AVG GLUCOSE)
ESTIMATED AVERAGE GLUCOSE: 148 mg/dL (ref 0–153)
HEMOGLOBIN A1C: 6.8 % (ref <=7.0)

## 2022-02-22 ENCOUNTER — Other Ambulatory Visit (HOSPITAL_BASED_OUTPATIENT_CLINIC_OR_DEPARTMENT_OTHER): Payer: Self-pay | Admitting: PHYSICIAN ASSISTANT

## 2022-02-22 ENCOUNTER — Other Ambulatory Visit: Payer: Medicare PPO | Attending: PHYSICIAN ASSISTANT

## 2022-02-22 DIAGNOSIS — H9193 Unspecified hearing loss, bilateral: Secondary | ICD-10-CM

## 2022-02-22 DIAGNOSIS — E1165 Type 2 diabetes mellitus with hyperglycemia: Secondary | ICD-10-CM | POA: Insufficient documentation

## 2022-02-22 DIAGNOSIS — R682 Dry mouth, unspecified: Secondary | ICD-10-CM

## 2022-02-22 DIAGNOSIS — H543 Unqualified visual loss, both eyes: Secondary | ICD-10-CM

## 2022-02-22 LAB — CBC WITH DIFF
BASOPHIL #: 0.1 10*3/uL (ref 0.00–0.30)
BASOPHIL %: 1 % (ref 0–1)
EOSINOPHIL #: 0.2 10*3/uL (ref 0.00–0.50)
EOSINOPHIL %: 4 % (ref 0–4)
HCT: 35.4 % — ABNORMAL LOW (ref 37.0–47.0)
HGB: 11.6 g/dL — ABNORMAL LOW (ref 12.5–16.0)
LYMPHOCYTE #: 1.9 10*3/uL (ref 0.90–4.80)
LYMPHOCYTE %: 40 % — ABNORMAL HIGH (ref 23–35)
MCH: 26.1 pg — ABNORMAL LOW (ref 28.0–33.0)
MCHC: 32.7 g/dL (ref 32.0–37.0)
MCV: 79.8 fL (ref 78.0–100.0)
MONOCYTE #: 0.5 10*3/uL (ref 0.30–0.90)
MONOCYTE %: 11 % (ref 0–12)
NEUTROPHIL #: 2.1 10*3/uL (ref 1.70–7.00)
NEUTROPHIL %: 44 % — ABNORMAL LOW (ref 50–70)
PLATELETS: 249 10*3/uL (ref 130–400)
RBC: 4.44 10*6/uL (ref 4.20–5.40)
RDW: 14.9 % (ref 9.9–16.5)
WBC: 4.7 10*3/uL (ref 4.5–11.0)

## 2022-02-22 LAB — COMPREHENSIVE METABOLIC PNL, FASTING
ALBUMIN: 3.7 g/dL (ref 3.4–4.8)
ALKALINE PHOSPHATASE: 75 U/L (ref 55–145)
ALT (SGPT): 37 U/L — ABNORMAL HIGH (ref 8–22)
ANION GAP: 7 mmol/L (ref 4–13)
AST (SGOT): 25 U/L (ref 8–45)
BILIRUBIN TOTAL: 0.3 mg/dL (ref 0.3–1.3)
BUN/CREA RATIO: 16 (ref 6–22)
BUN: 15 mg/dL (ref 8–25)
CALCIUM: 9.2 mg/dL (ref 8.8–10.2)
CHLORIDE: 101 mmol/L (ref 96–111)
CO2 TOTAL: 25 mmol/L (ref 23–31)
CREATININE: 0.93 mg/dL (ref 0.60–1.05)
ESTIMATED GFR: 64 mL/min/BSA (ref >=60)
GLUCOSE: 137 mg/dL — ABNORMAL HIGH (ref 70–99)
POTASSIUM: 4.7 mmol/L (ref 3.5–5.1)
PROTEIN TOTAL: 7 g/dL (ref 6.0–8.0)
SODIUM: 133 mmol/L — ABNORMAL LOW (ref 136–145)

## 2022-02-22 LAB — LIPID PANEL
CHOL/HDL RATIO: 4.7
CHOLESTEROL: 191 mg/dL (ref 100–200)
HDL CHOL: 41 mg/dL — ABNORMAL LOW (ref >=50)
LDL CALC: 107 mg/dL — ABNORMAL HIGH (ref <100)
NON-HDL: 150 mg/dL (ref <=190)
TRIGLYCERIDES: 252 mg/dL — ABNORMAL HIGH (ref <150)
VLDL CALC: 42 mg/dL — ABNORMAL HIGH (ref <30)

## 2022-02-22 LAB — THYROID STIMULATING HORMONE WITH FREE T4 REFLEX: TSH: 1.924 u[IU]/mL (ref 0.430–3.550)

## 2022-02-22 NOTE — Nursing Note (Signed)
Pt stopped into the office she would like a referral to Dr Providence Crosby and Dr Toy Baker

## 2022-03-10 ENCOUNTER — Other Ambulatory Visit (HOSPITAL_BASED_OUTPATIENT_CLINIC_OR_DEPARTMENT_OTHER): Payer: Self-pay | Admitting: PHYSICIAN ASSISTANT

## 2022-03-10 DIAGNOSIS — H9190 Unspecified hearing loss, unspecified ear: Secondary | ICD-10-CM

## 2022-03-22 ENCOUNTER — Other Ambulatory Visit (HOSPITAL_BASED_OUTPATIENT_CLINIC_OR_DEPARTMENT_OTHER): Payer: Self-pay | Admitting: PHYSICIAN ASSISTANT

## 2022-03-22 ENCOUNTER — Ambulatory Visit: Payer: Medicare PPO | Attending: PHYSICIAN ASSISTANT | Admitting: PHYSICIAN ASSISTANT

## 2022-03-22 ENCOUNTER — Other Ambulatory Visit: Payer: Self-pay

## 2022-03-22 ENCOUNTER — Encounter (HOSPITAL_BASED_OUTPATIENT_CLINIC_OR_DEPARTMENT_OTHER): Payer: Self-pay | Admitting: PHYSICIAN ASSISTANT

## 2022-03-22 VITALS — BP 136/72 | HR 100 | Wt 167.0 lb

## 2022-03-22 DIAGNOSIS — F419 Anxiety disorder, unspecified: Secondary | ICD-10-CM | POA: Insufficient documentation

## 2022-03-22 DIAGNOSIS — E538 Deficiency of other specified B group vitamins: Secondary | ICD-10-CM | POA: Insufficient documentation

## 2022-03-22 DIAGNOSIS — M159 Polyosteoarthritis, unspecified: Secondary | ICD-10-CM | POA: Insufficient documentation

## 2022-03-22 DIAGNOSIS — E1165 Type 2 diabetes mellitus with hyperglycemia: Secondary | ICD-10-CM | POA: Insufficient documentation

## 2022-03-22 DIAGNOSIS — I10 Essential (primary) hypertension: Secondary | ICD-10-CM | POA: Insufficient documentation

## 2022-03-22 DIAGNOSIS — F39 Unspecified mood [affective] disorder: Secondary | ICD-10-CM | POA: Insufficient documentation

## 2022-03-22 MED ORDER — KETOROLAC 60 MG/2 ML INTRAMUSCULAR SOLUTION
60.0000 mg | Freq: Once | INTRAMUSCULAR | Status: AC
Start: 2022-03-22 — End: 2022-03-22
  Administered 2022-03-22: 60 mg via INTRAMUSCULAR

## 2022-03-22 MED ORDER — LEVOTHYROXINE 50 MCG TABLET
50.0000 ug | ORAL_TABLET | Freq: Every morning | ORAL | 0 refills | Status: DC
Start: 2022-03-22 — End: 2022-06-23

## 2022-03-22 MED ORDER — CYANOCOBALAMIN (VIT B-12) 1,000 MCG/ML INJECTION SOLUTION
1000.0000 ug | Freq: Once | INTRAMUSCULAR | 0 refills | Status: AC
Start: 2022-03-22 — End: 2022-03-22

## 2022-03-22 NOTE — Nursing Note (Signed)
Administrations This Visit     ketorolac (TORADOL) 60mg /2 mL IM injection     Admin Date  03/22/2022 Action  Given Dose  60 mg Route  IntraMUSCULAR Administered By  05/22/2022, LPN

## 2022-03-22 NOTE — Nursing Note (Signed)
Pt is here b/c she had a fall last Wednesday.    She states she fell in her kitchen and landed flat on her back.    She was not seen in ED, c/o of just feeling achy since then.

## 2022-03-22 NOTE — Nursing Note (Signed)
03/22/22 1200   B12 Injection flowsheet   Medication Vitamin B12   Dose (mcg) 1,000 mcg   Site Left Gluteus   Route IM   Lot # [id number]   Expiration date 02/15/23   Manufacturer [employer]   NDC # [id number]

## 2022-03-22 NOTE — Progress Notes (Signed)
FAMILY MEDICINE, [facility]ILD[facility]  [address]  [place] [address]  Phone: [phone]  Fax: [phone]    Encounter Date: 03/22/2022    Patient ID:  Elizabeth Page  [id number]:[id number]    DOB: 04-Sep-1946  Age: 76 y.o. female    Subjective:     Chief Complaint   Patient presents with   . Fall     here for a follow up.  fell in kitchen last week.  she states on loc.  low back and neck feel sore.    The history is provided by the patient.     Current Outpatient Medications   Medication Sig   . ACCU-CHEK AVIVA CONTROL SOLN Solution    . ACCU-CHEK SOFTCLIX LANCETS Misc    . Amlodipine-Olmesartan 5-20 mg Oral Tablet Take 1 Tablet by mouth Once a day   . clonazePAM (KLONOPIN) 0.5 mg Oral Tablet Take 1 Tablet (0.5 mg total) by mouth Three times a day as needed Indications: panic disorder   . cyanocobalamin (VITAMIN B 12) 1,000 mcg Oral Tablet Take 1 Tablet (1,000 mcg total) by mouth Once a day   . cyanocobalamin (VITAMIN B12) 1,000 mcg/mL Injection Solution Inject 1 mL (1,000 mcg total) into the muscle One time for 1 dose   . DULoxetine (CYMBALTA DR) 60 mg Oral Capsule, Delayed Release(E.C.) Take 1 Capsule (60 mg total) by mouth Once a day for 180 days Indications: major depressive disorder   . levothyroxine (SYNTHROID) 50 mcg Oral Tablet Take 1 Tablet (50 mcg total) by mouth Every morning   . metformin (GLUCOPHAGE-XR) 750 mg Oral Tablet Sustained Release 24 hr Take 1 Tablet (750 mg total) by mouth Twice daily   . naproxen Sodium (ALEVE) 220 mg Oral Tablet Take 1 Tablet (220 mg total) by mouth Twice daily with food     Allergies   Allergen Reactions   . Allergy-Decongestant [Dexbrompheniramine-Pseudoephed]    . Codeine    . Cortisone    . Motrin [Ibuprofen]      Past Medical History:   Diagnosis Date   . Anxiety    . Depression    . Diabetes mellitus, type 2 (CMS HCC)    . GERD (gastroesophageal reflux disease)    . History of concussion    . HTN (hypertension)    . Hyperlipidemia    . Stress    .  Ulcerative colitis (CMS HCC)          Past Surgical History:   Procedure Laterality Date   . HX CESAREAN SECTION     . HX HYSTERECTOMY     . ORIF ANKLE FRACTURE     . TOTAL ABDOMINAL HYSTERECTOMY           Family Medical History:     Problem Relation (Age of Onset)    Other Mother, Father          Social History     Tobacco Use   . Smoking status: Never   . Smokeless tobacco: Never   Substance Use Topics   . Alcohol use: No   . Drug use: Never       Review of Systems   Constitutional: Negative.    HENT: Negative.    Eyes: Negative.    Respiratory: Negative.    Cardiovascular: Negative.    Gastrointestinal: Negative.    Endocrine: Negative.    Genitourinary: Negative.    Musculoskeletal: Positive for arthralgias, back pain and neck pain.   Skin: Negative.  Allergic/Immunologic: Negative.    Neurological: Negative.    Hematological: Negative.    Psychiatric/Behavioral: Negative.      Objective:   Vitals: BP 136/72   Pulse 100   Wt 75.8 kg (167 lb)   SpO2 93%   BMI 30.54 kg/m         Physical Exam  Vitals and nursing note reviewed.   Constitutional:       Appearance: She is well-developed.   HENT:      Head: Normocephalic and atraumatic.   Eyes:      Conjunctiva/sclera: Conjunctivae normal.      Pupils: Pupils are equal, round, and reactive to light.   Cardiovascular:      Rate and Rhythm: Normal rate and regular rhythm.      Heart sounds: Normal heart sounds. No murmur heard.    No friction rub. No gallop.   Pulmonary:      Effort: Pulmonary effort is normal.      Breath sounds: Normal breath sounds.   Abdominal:      General: Bowel sounds are normal.      Palpations: Abdomen is soft.   Musculoskeletal:         General: Normal range of motion.      Cervical back: Normal range of motion and neck supple.   Skin:     General: Skin is warm and dry.   Neurological:      Mental Status: She is alert and oriented to person, place, and time.      Deep Tendon Reflexes: Reflexes are normal and symmetric.   Psychiatric:          Behavior: Behavior normal.         Thought Content: Thought content normal.         Judgment: Judgment normal.         Assessment & Plan:     ENCOUNTER DIAGNOSES     ICD-10-CM   1. Type 2 diabetes mellitus with hyperglycemia, without long-term current use of insulin (CMS HCC)  E11.65   2. B12 deficiency  E53.8   3. Primary osteoarthritis involving multiple joints  M15.9   4. Essential hypertension  I10   5. Mood disorder (CMS HCC)  F39   6. Anxiety  F41.9       Orders Placed This Encounter   . cyanocobalamin (VITAMIN B12) 1,000 mcg/mL Injection Solution   . ketorolac (TORADOL) 60mg /2 mL IM injection     defers xrays.  discussed labs.  Return in about 6 months (around 09/21/2022), or if symptoms worsen or fail to improve.    14/02/2022, PA-C

## 2022-04-06 ENCOUNTER — Other Ambulatory Visit (HOSPITAL_BASED_OUTPATIENT_CLINIC_OR_DEPARTMENT_OTHER): Payer: Self-pay | Admitting: PHYSICIAN ASSISTANT

## 2022-04-06 DIAGNOSIS — E119 Type 2 diabetes mellitus without complications: Secondary | ICD-10-CM

## 2022-05-04 ENCOUNTER — Other Ambulatory Visit: Payer: Self-pay

## 2022-05-04 ENCOUNTER — Encounter (HOSPITAL_BASED_OUTPATIENT_CLINIC_OR_DEPARTMENT_OTHER): Payer: Self-pay | Admitting: PHYSICIAN ASSISTANT

## 2022-05-04 ENCOUNTER — Ambulatory Visit: Payer: Medicare PPO | Attending: PHYSICIAN ASSISTANT | Admitting: PHYSICIAN ASSISTANT

## 2022-05-04 VITALS — BP 130/68 | HR 89 | Wt 169.0 lb

## 2022-05-04 DIAGNOSIS — F419 Anxiety disorder, unspecified: Secondary | ICD-10-CM | POA: Insufficient documentation

## 2022-05-04 DIAGNOSIS — K219 Gastro-esophageal reflux disease without esophagitis: Secondary | ICD-10-CM | POA: Insufficient documentation

## 2022-05-04 DIAGNOSIS — F39 Unspecified mood [affective] disorder: Secondary | ICD-10-CM | POA: Insufficient documentation

## 2022-05-04 DIAGNOSIS — E1165 Type 2 diabetes mellitus with hyperglycemia: Secondary | ICD-10-CM | POA: Insufficient documentation

## 2022-05-04 DIAGNOSIS — Z7984 Long term (current) use of oral hypoglycemic drugs: Secondary | ICD-10-CM

## 2022-05-04 DIAGNOSIS — I10 Essential (primary) hypertension: Secondary | ICD-10-CM | POA: Insufficient documentation

## 2022-05-04 NOTE — Nursing Note (Signed)
Pt is here for 6 month follow up

## 2022-05-04 NOTE — Progress Notes (Signed)
FAMILY MEDICINE, [facility]ESSION[facility]ILD[facility]  [address]  [place] [address]  Phone: [phone]  Fax: [phone]    Encounter Date: 05/04/2022    Patient ID:  Elizabeth Page  [id number]:[id number]    DOB: [birth year]  Age: 76 y.o. female    Subjective:     Chief Complaint   Patient presents with   . Follow Up     HERE FOR A FOLLOW UP.  She IS DOING WELL.    The history is provided by the patient.     Current Outpatient Medications   Medication Sig   . ACCU-CHEK AVIVA CONTROL SOLN Solution    . ACCU-CHEK SOFTCLIX LANCETS Misc    . Amlodipine-Olmesartan 5-20 mg Oral Tablet Take 1 Tablet by mouth Once a day   . clonazePAM (KLONOPIN) 0.5 mg Oral Tablet Take 1 Tablet (0.5 mg total) by mouth Three times a day as needed Indications: panic disorder   . cyanocobalamin (VITAMIN B 12) 1,000 mcg Oral Tablet Take 1 Tablet (1,000 mcg total) by mouth Once a day   . DULoxetine (CYMBALTA DR) 60 mg Oral Capsule, Delayed Release(E.C.) Take 1 Capsule (60 mg total) by mouth Once a day for 180 days Indications: major depressive disorder   . levothyroxine (SYNTHROID) 50 mcg Oral Tablet Take 1 Tablet (50 mcg total) by mouth Every morning   . metformin (GLUCOPHAGE-XR) 750 mg Oral Tablet Sustained Release 24 hr Take 1 tablet by mouth twice daily   . naproxen Sodium (ALEVE) 220 mg Oral Tablet Take 1 Tablet (220 mg total) by mouth Twice daily with food     Allergies   Allergen Reactions   . Allergy-Decongestant [Dexbrompheniramine-Pseudoephed]    . Codeine    . Cortisone    . Motrin [Ibuprofen]      Past Medical History:   Diagnosis Date   . Anxiety    . Depression    . Diabetes mellitus, type 2 (CMS HCC)    . GERD (gastroesophageal reflux disease)    . History of concussion    . HTN (hypertension)    . Hyperlipidemia    . Stress    . Ulcerative colitis (CMS HCC)          Past Surgical History:   Procedure Laterality Date   . HX CESAREAN SECTION     . HX HYSTERECTOMY     . ORIF ANKLE FRACTURE     . TOTAL ABDOMINAL HYSTERECTOMY            Family Medical History:     Problem Relation (Age of Onset)    Other Mother, Father          Social History     Tobacco Use   . Smoking status: Never   . Smokeless tobacco: Never   Substance Use Topics   . Alcohol use: No   . Drug use: Never       Review of Systems   Constitutional: Negative.    HENT: Negative.    Eyes: Negative.    Respiratory: Negative.    Cardiovascular: Negative.    Gastrointestinal: Negative.    Endocrine: Negative.    Genitourinary: Negative.    Musculoskeletal: Positive for arthralgias.   Skin: Negative.    Allergic/Immunologic: Negative.    Neurological: Negative.    Hematological: Negative.    Psychiatric/Behavioral: Negative for self-injury, sleep disturbance and suicidal ideas. The patient is nervous/anxious.      Objective:   Vitals: BP 130/68   Pulse  89   Wt 76.7 kg (169 lb)   SpO2 98%   BMI 30.91 kg/m         Physical Exam  Vitals and nursing note reviewed.   Constitutional:       Appearance: She is well-developed.   HENT:      Head: Normocephalic and atraumatic.   Eyes:      Conjunctiva/sclera: Conjunctivae normal.      Pupils: Pupils are equal, round, and reactive to light.   Cardiovascular:      Rate and Rhythm: Normal rate and regular rhythm.      Heart sounds: Normal heart sounds. No murmur heard.    No friction rub. No gallop.   Pulmonary:      Effort: Pulmonary effort is normal.      Breath sounds: Normal breath sounds.   Abdominal:      General: Bowel sounds are normal.      Palpations: Abdomen is soft.   Musculoskeletal:         General: Normal range of motion.      Cervical back: Normal range of motion and neck supple.   Skin:     General: Skin is warm and dry.   Neurological:      Mental Status: She is alert and oriented to person, place, and time.      Deep Tendon Reflexes: Reflexes are normal and symmetric.   Psychiatric:         Behavior: Behavior normal.         Thought Content: Thought content normal.         Judgment: Judgment normal.         Assessment &  Plan:     ENCOUNTER DIAGNOSES     ICD-10-CM   1. Type 2 diabetes mellitus with hyperglycemia, without long-term current use of insulin (CMS HCC)  E11.65   2. Essential hypertension  I10   3. GERD without esophagitis  K21.9   4. Mood disorder (CMS HCC)  F39   5. Anxiety  F41.9       No orders of the defined types were placed in this encounter.      Return in about 6 months (around 11/04/2022), or if symptoms worsen or fail to improve.    [name], PA-C

## 2022-05-06 ENCOUNTER — Encounter (INDEPENDENT_AMBULATORY_CARE_PROVIDER_SITE_OTHER): Payer: Self-pay | Admitting: PSYCHIATRY

## 2022-05-10 ENCOUNTER — Encounter (INDEPENDENT_AMBULATORY_CARE_PROVIDER_SITE_OTHER): Payer: Medicare PPO | Admitting: PSYCHIATRY

## 2022-05-12 ENCOUNTER — Encounter (INDEPENDENT_AMBULATORY_CARE_PROVIDER_SITE_OTHER): Payer: Self-pay | Admitting: PSYCHIATRY

## 2022-05-12 ENCOUNTER — Ambulatory Visit (INDEPENDENT_AMBULATORY_CARE_PROVIDER_SITE_OTHER): Payer: Medicare PPO | Admitting: PSYCHIATRY

## 2022-05-12 ENCOUNTER — Other Ambulatory Visit: Payer: Self-pay

## 2022-05-12 DIAGNOSIS — F331 Major depressive disorder, recurrent, moderate: Secondary | ICD-10-CM

## 2022-05-12 DIAGNOSIS — F410 Panic disorder [episodic paroxysmal anxiety] without agoraphobia: Secondary | ICD-10-CM

## 2022-05-12 MED ORDER — CLONAZEPAM 0.5 MG TABLET
0.5000 mg | ORAL_TABLET | Freq: Three times a day (TID) | ORAL | 5 refills | Status: DC | PRN
Start: 2022-05-12 — End: 2022-05-14

## 2022-05-12 MED ORDER — DULOXETINE 60 MG CAPSULE,DELAYED RELEASE
60.0000 mg | DELAYED_RELEASE_CAPSULE | Freq: Every day | ORAL | 5 refills | Status: DC
Start: 2022-05-12 — End: 2022-11-15

## 2022-05-12 NOTE — Progress Notes (Signed)
BEHAVIORAL MEDICINE, [facility]   [address]  [place] [address]       Name: Elizabeth Page MRN:  [id number]   Date: 05/12/2022 Age: 76 y.o.       History:        Last visit:  11/12/2021    Interval History:  Overall she has been stable/well psychiatrically but remains bothered by the apartment she is living in [facility]). Has the tendency to get too involved with unhealthy people there.    Son and his wife moved to [place] recently but she does not want to got there.  Reports chronic life dissatisfaction and I reminded her that.    Still panicky at times and feels she needs her Klonopin 0.5 mg t.i.d..  Seemed to be having limited symptom attacks but many of them were not spontaneous and are associated with "stress".      Overall mood/depressive symptoms are at a very low level but still persists somewhat but part of visit her reluctance make change\allow other people their opinions.  Nevertheless I supported her for progress today.      Substance use: Denies any substance use.    Review of systems:   Constitutional: Denies any medication side effect.  GI: Denies any diarrhea, constipation.  Dermatological:  Denies any skin rash    Medical and Surgical history in the interim: Non-contributory.  Past Medical History:   Diagnosis Date   . Anxiety    . Depression    . Diabetes mellitus, type 2 (CMS HCC)    . GERD (gastroesophageal reflux disease)    . History of concussion    . HTN (hypertension)    . Hyperlipidemia    . Stress    . Ulcerative colitis (CMS HCC)        Past Surgical History:   Procedure Laterality Date   . HX CESAREAN SECTION     . HX HYSTERECTOMY     . ORIF ANKLE FRACTURE     . TOTAL ABDOMINAL HYSTERECTOMY         Current Outpatient Medications   Medication Sig Dispense Refill   . ACCU-CHEK AVIVA CONTROL SOLN Solution      . ACCU-CHEK SOFTCLIX LANCETS Misc      . Amlodipine-Olmesartan 5-20 mg Oral Tablet Take 1 Tablet by mouth Once a day 90 Tablet 0   .  clonazePAM (KLONOPIN) 0.5 mg Oral Tablet Take 1 Tablet (0.5 mg total) by mouth Three times a day as needed Indications: panic disorder 90 Tablet 5   . cyanocobalamin (VITAMIN B 12) 1,000 mcg Oral Tablet Take 1 Tablet (1,000 mcg total) by mouth Once a day     . DULoxetine (CYMBALTA DR) 60 mg Oral Capsule, Delayed Release(E.C.) Take 1 Capsule (60 mg total) by mouth Once a day for 180 days Indications: major depressive disorder 30 Capsule 5   . levothyroxine (SYNTHROID) 50 mcg Oral Tablet Take 1 Tablet (50 mcg total) by mouth Every morning 90 Tablet 0   . metformin (GLUCOPHAGE-XR) 750 mg Oral Tablet Sustained Release 24 hr Take 1 tablet by mouth twice daily 180 Tablet 1   . naproxen Sodium (ALEVE) 220 mg Oral Tablet Take 1 Tablet (220 mg total) by mouth Twice daily with food       No current facility-administered medications for this visit.        Allergies:   Allergies   Allergen Reactions   . Allergy-Decongestant [Dexbrompheniramine-Pseudoephed]    .  Codeine    . Cortisone    . Motrin [Ibuprofen]        Exam:      BP 108/64   Pulse 55   Resp 18   Ht 1.588 m (5' 2.5")   Wt 76.7 kg (169 lb)   SpO2 97%   BMI 30.42 kg/m       General appearance: Well dressed, well groomed.  Pleasant, good eye contact.  Musculoskeletal: Normal gait and station. Normal muscle strength and tone.  Speech: Articulate, appropriate with spontaneous elaborations  Affect: Congruent with mood which is described as "okay I guess".  Broad affect/smiles easily.  Thought process: Logical, linear and goal directed.  Association: Intact  Abnormal thought: None/denies hopelessness, death wishes or suicidal ideations.  Denies violent thoughts.  Judgement and Insight: Partial  Attention and concentration: Adequate  Language: Fluent use of English.    Assessment: Diagnosis: unchanged.  Psychiatrically/psychologically stable.     Substance use is not a concern. No legal issues. No acute suicide risk. Discussed status and reviewed options for therapy.  Discussed medications, psychotherapy and structures/schedule of program. Reviewed plan, goals, rationale, risks, benefits, and alternatives. Patient indicates understanding and agreement. Has decision making capacity.        ICD-10-CM    1. 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 Panic disorder  F41.0 clonazePAM (KLONOPIN) 0.5 mg Oral Tablet           Plan:   Medications:  Continue current.  Therapy:  Continue current supportive work.  RTC:  6 months  Labs/test/imagining: None indicated.  I spent approximately 22 minutes on this case today with the patient present over 66% of that time and we discussed her treatment plan, issues, medications, potential medication adverse effects and I obtained informed consent.  Over 50% of her time was spent on counseling, supportive psychotherapy and patient Education.    Latina [name], MD      This note was partially created using voice recognition software and is inherently subject to errors including those of syntax and "sound-alike" substitutions which may escape proofreading.  In such instances, original meaning may be extrapolated by contextual derivation.

## 2022-05-12 NOTE — Nursing Note (Signed)
05/12/22 1515   [place] Suicide Severity Rating Scale (Since Last Contact Screener)   1. Wish to be Dead (Since Last Contact) N   2. Non-Specific Active Suicidal Thoughts (Since Last Contact) N   6. Suicidal Behavior (Since Last Contact) N   Calculated C-SSRS Risk Score (Since Last Contact) No Risk Indicated

## 2022-05-12 NOTE — Nursing Note (Signed)
05/12/22 1515   PHQ 9 (follow up)   Little interest or pleasure in doing things. 3   Feeling down, depressed, or hopeless 1   PHQ 2 Total 4   Trouble falling or staying asleep, or sleeping too much. 3   Feeling tired or having little energy 3   Poor appetite or overeating 0   Feeling bad about yourself/ that you are a failure in the past 2 weeks? 0   Trouble concentrating on things in the past 2 weeks? 3   Moving/Speaking slowly or being fidgety or restless  in the past 2 weeks? 0   Thoughts that you would be better off DEAD, or of hurting yourself in some way. 0   If you checked off any problems, how difficult have these problems made it for you to do your work, take care of things at home, or get along with other people? Extremely difficult   PHQ 9 Total 13   Interpretation of Total Score Moderate depression

## 2022-05-14 ENCOUNTER — Other Ambulatory Visit (INDEPENDENT_AMBULATORY_CARE_PROVIDER_SITE_OTHER): Payer: Self-pay | Admitting: PSYCHIATRY

## 2022-05-14 DIAGNOSIS — F410 Panic disorder [episodic paroxysmal anxiety] without agoraphobia: Secondary | ICD-10-CM

## 2022-05-14 MED ORDER — CLONAZEPAM 0.5 MG TABLET
0.5000 mg | ORAL_TABLET | Freq: Three times a day (TID) | ORAL | 5 refills | Status: DC | PRN
Start: 2022-05-14 — End: 2022-11-15

## 2022-05-14 NOTE — Telephone Encounter (Signed)
Can you please resend this?  It was sent to the wrong pharmacy.  It needs to be sent to [facility].

## 2022-06-03 ENCOUNTER — Other Ambulatory Visit (HOSPITAL_BASED_OUTPATIENT_CLINIC_OR_DEPARTMENT_OTHER): Payer: Self-pay | Admitting: PHYSICIAN ASSISTANT

## 2022-06-23 ENCOUNTER — Other Ambulatory Visit (HOSPITAL_BASED_OUTPATIENT_CLINIC_OR_DEPARTMENT_OTHER): Payer: Self-pay | Admitting: PHYSICIAN ASSISTANT

## 2022-06-23 MED ORDER — LEVOTHYROXINE 50 MCG TABLET
50.0000 ug | ORAL_TABLET | Freq: Every morning | ORAL | 0 refills | Status: DC
Start: 2022-06-23 — End: 2022-09-21

## 2022-08-31 ENCOUNTER — Other Ambulatory Visit (HOSPITAL_BASED_OUTPATIENT_CLINIC_OR_DEPARTMENT_OTHER): Payer: Self-pay | Admitting: PHYSICIAN ASSISTANT

## 2022-08-31 MED ORDER — AMLODIPINE 5 MG-OLMESARTAN 20 MG TABLET
1.0000 | ORAL_TABLET | Freq: Every day | ORAL | 0 refills | Status: DC
Start: 2022-08-31 — End: 2022-11-25

## 2022-09-15 ENCOUNTER — Telehealth (HOSPITAL_BASED_OUTPATIENT_CLINIC_OR_DEPARTMENT_OTHER): Payer: Self-pay | Admitting: PHYSICIAN ASSISTANT

## 2022-09-16 ENCOUNTER — Telehealth (HOSPITAL_BASED_OUTPATIENT_CLINIC_OR_DEPARTMENT_OTHER): Payer: Self-pay | Admitting: PHYSICIAN ASSISTANT

## 2022-09-17 ENCOUNTER — Telehealth (HOSPITAL_BASED_OUTPATIENT_CLINIC_OR_DEPARTMENT_OTHER): Payer: Self-pay | Admitting: PHYSICIAN ASSISTANT

## 2022-09-21 ENCOUNTER — Other Ambulatory Visit (HOSPITAL_BASED_OUTPATIENT_CLINIC_OR_DEPARTMENT_OTHER): Payer: Self-pay | Admitting: PHYSICIAN ASSISTANT

## 2022-09-21 ENCOUNTER — Ambulatory Visit (HOSPITAL_BASED_OUTPATIENT_CLINIC_OR_DEPARTMENT_OTHER): Payer: Self-pay | Admitting: PHYSICIAN ASSISTANT

## 2022-09-22 MED ORDER — LEVOTHYROXINE 50 MCG TABLET
50.0000 ug | ORAL_TABLET | Freq: Every morning | ORAL | 0 refills | Status: DC
Start: 2022-09-22 — End: 2022-11-25

## 2022-10-04 ENCOUNTER — Other Ambulatory Visit (HOSPITAL_BASED_OUTPATIENT_CLINIC_OR_DEPARTMENT_OTHER): Payer: Self-pay | Admitting: PHYSICIAN ASSISTANT

## 2022-10-04 DIAGNOSIS — E119 Type 2 diabetes mellitus without complications: Secondary | ICD-10-CM

## 2022-10-04 MED ORDER — METFORMIN ER 750 MG TABLET,EXTENDED RELEASE 24 HR
750.0000 mg | ORAL_TABLET | Freq: Two times a day (BID) | ORAL | 1 refills | Status: DC
Start: 2022-10-04 — End: 2022-11-25

## 2022-10-06 ENCOUNTER — Telehealth (INDEPENDENT_AMBULATORY_CARE_PROVIDER_SITE_OTHER): Payer: Self-pay | Admitting: PSYCHIATRY

## 2022-10-06 NOTE — Telephone Encounter (Signed)
Elizabeth Page wants to see you sooner. She's very upset about her housing. She also wants you to write a letter to her housing office because they are asking her to follow some guidelines in order to maintain residency.

## 2022-10-07 NOTE — Telephone Encounter (Signed)
Patient is scheduled on 10/08/2022 [facility]

## 2022-10-08 ENCOUNTER — Encounter (INDEPENDENT_AMBULATORY_CARE_PROVIDER_SITE_OTHER): Payer: Self-pay | Admitting: PSYCHIATRY

## 2022-10-29 ENCOUNTER — Telehealth (HOSPITAL_BASED_OUTPATIENT_CLINIC_OR_DEPARTMENT_OTHER): Payer: Self-pay | Admitting: PHYSICIAN ASSISTANT

## 2022-11-05 ENCOUNTER — Ambulatory Visit (HOSPITAL_BASED_OUTPATIENT_CLINIC_OR_DEPARTMENT_OTHER): Payer: Self-pay | Admitting: PHYSICIAN ASSISTANT

## 2022-11-13 ENCOUNTER — Other Ambulatory Visit (INDEPENDENT_AMBULATORY_CARE_PROVIDER_SITE_OTHER): Payer: Self-pay | Admitting: PSYCHIATRY

## 2022-11-13 DIAGNOSIS — F410 Panic disorder [episodic paroxysmal anxiety] without agoraphobia: Secondary | ICD-10-CM

## 2022-11-15 ENCOUNTER — Encounter (INDEPENDENT_AMBULATORY_CARE_PROVIDER_SITE_OTHER): Payer: Self-pay | Admitting: PSYCHIATRY

## 2022-11-15 ENCOUNTER — Other Ambulatory Visit: Payer: Self-pay

## 2022-11-15 ENCOUNTER — Other Ambulatory Visit (INDEPENDENT_AMBULATORY_CARE_PROVIDER_SITE_OTHER): Payer: Self-pay | Admitting: PSYCHIATRY

## 2022-11-15 ENCOUNTER — Ambulatory Visit (INDEPENDENT_AMBULATORY_CARE_PROVIDER_SITE_OTHER): Payer: Medicare PPO | Admitting: PSYCHIATRY

## 2022-11-15 VITALS — BP 106/60 | HR 110 | Resp 16 | Ht 62.5 in

## 2022-11-15 DIAGNOSIS — F410 Panic disorder [episodic paroxysmal anxiety] without agoraphobia: Secondary | ICD-10-CM

## 2022-11-15 DIAGNOSIS — F331 Major depressive disorder, recurrent, moderate: Secondary | ICD-10-CM

## 2022-11-15 MED ORDER — CLONAZEPAM 0.5 MG TABLET
0.5000 mg | ORAL_TABLET | Freq: Three times a day (TID) | ORAL | 5 refills | Status: DC | PRN
Start: 2022-11-15 — End: 2023-05-13

## 2022-11-15 MED ORDER — DULOXETINE 60 MG CAPSULE,DELAYED RELEASE
60.0000 mg | DELAYED_RELEASE_CAPSULE | Freq: Every day | ORAL | 5 refills | Status: DC
Start: 2022-11-15 — End: 2023-02-08

## 2022-11-15 NOTE — Progress Notes (Signed)
BEHAVIORAL MEDICINE, [facility]   [place] [address]       Name: Elizabeth Page MRN:  [id number]   Date: 11/15/2022 Age: 77 y.o.       History:        Last visit: 05/12/2022    Interval History:  "Depressed" due to her apartment issues. Feels she has no other choice.  Son is in [place] with his wife and their 15 year old daughter and she has put in application to move to a retirement home in [place] near son.  I told her to call and check on where she is on the waiting list.  I think it will make her feel better if she had some sense of moving up on list.  Has little contact with sister but does like her brother but not wife.    Panic attacks are occasional but controlled by the klonopin but she says she must take it tid.    Depressive sx are at a low level but chronic and she blames a lot of it on her current living situation and is isolated so she has difficult memories from her traumatic life.  I supported her today.  Compliant with medications and denies adverse effects.  No alcohol use.  Denies suicidal ideations or death wishes.  Does admit to irritability and some sadness at times and mild anhedonia.      Substance use: Denies any substance use.    Review of systems:   Constitutional: Denies any medication side effect.  GI: Denies any diarrhea, constipation.  Dermatological:  Denies any skin rash    Medical and Surgical history in the interim: Non-contributory.  Past Medical History:   Diagnosis Date    Anxiety     Depression     Diabetes mellitus, type 2 (CMS HCC)     GERD (gastroesophageal reflux disease)     History of concussion     HTN (hypertension)     Hyperlipidemia     Stress     Ulcerative colitis (CMS HCC)        Past Surgical History:   Procedure Laterality Date    HX CESAREAN SECTION      HX HYSTERECTOMY      ORIF ANKLE FRACTURE      TOTAL ABDOMINAL HYSTERECTOMY         Current Outpatient Medications   Medication Sig Dispense Refill    ACCU-CHEK AVIVA  CONTROL SOLN Solution       ACCU-CHEK SOFTCLIX LANCETS Misc       Amlodipine-Olmesartan 5-20 mg Oral Tablet Take 1 Tablet by mouth Once a day 90 Tablet 0    clonazePAM (KLONOPIN) 0.5 mg Oral Tablet Take 1 Tablet (0.5 mg total) by mouth Three times a day as needed Indications: panic disorder 90 Tablet 5    DULoxetine (CYMBALTA DR) 60 mg Oral Capsule, Delayed Release(E.C.) Take 1 Capsule (60 mg total) by mouth Once a day for 180 days Indications: major depressive disorder 30 Capsule 5    levothyroxine (SYNTHROID) 50 mcg Oral Tablet Take 1 Tablet (50 mcg total) by mouth Every morning 90 Tablet 0    metformin (GLUCOPHAGE-XR) 750 mg Oral Tablet Sustained Release 24 hr Take 1 Tablet (750 mg total) by mouth Twice daily 180 Tablet 1    naproxen Sodium (ALEVE) 220 mg Oral Tablet Take 1 Tablet (220 mg total) by mouth Twice daily with food       No  current facility-administered medications for this visit.        Allergies:   Allergies   Allergen Reactions    Allergy-Decongestant [Dexbrompheniramine-Pseudoephed]     Codeine     Cortisone     Motrin [Ibuprofen]        Exam:      BP 106/60   Pulse (!) 110   Resp 16   Ht 1.588 m (5' 2.5")   SpO2 95%   BMI 30.42 kg/m       General appearance: Well dressed, well groomed.  Pleasant, good eye contact, cooperative.  Musculoskeletal: Normal gait and station. Normal muscle strength and tone.  Speech: Articulate, appropriate with spontaneous elaborations.  Normal rate and volume.  Affect: Congruent with mood which is described as "depressed".  Actually is rather animated.  Good range to affect, slightly irritable.  Then was joking at times.  Thought process: Logical, linear and goal directed.  Association: Intact  Abnormal thought: None/denies hopelessness or death wishes or suicidal ideations.  Denies psychotic symptoms or violent thoughts.  Judgement and Insight: Partial  Attention and concentration: Adequate  Language: Fluent use of English.    Assessment: Diagnosis:  unchanged.  Psychiatrically and psychologically stable.     Substance use is not a concern. No legal issues. No acute suicide risk. Discussed status and reviewed options for therapy. Discussed medications, psychotherapy and structures/schedule of program. Reviewed plan, goals, rationale, risks, benefits, and alternatives. Patient indicates understanding and agreement. Has decision making capacity.        ICD-10-CM    1. Panic disorder  F41.0       2. 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   Therapy:  Continue current supportive work.  RTC:  6 months.  Labs/test/imagining: None indicated.  I spent approximately 22 minutes on this case today with the patient present over 66% of that time and we discussed her treatment plan, issues, medications, potential medication adverse effects and I obtained informed consent.  Over 50% of her time was spent on counseling, supportive psychotherapy and patient Education.    Awanda Mink, MD      This note was partially created using voice recognition software and is inherently subject to errors including those of syntax and "sound-alike" substitutions which may escape proofreading.  In such instances, original meaning may be extrapolated by contextual derivation.

## 2022-11-15 NOTE — Nursing Note (Signed)
11/15/22 1531   PHQ 9 (follow up)   Little interest or pleasure in doing things. 0   Feeling down, depressed, or hopeless 3   PHQ 2 Total 3   Trouble falling or staying asleep, or sleeping too much. 3   Feeling tired or having little energy 3   Poor appetite or overeating 3   Feeling bad about yourself/ that you are a failure in the past 2 weeks? 0   Trouble concentrating on things in the past 2 weeks? 3   Moving/Speaking slowly or being fidgety or restless  in the past 2 weeks? 3   Thoughts that you would be better off DEAD, or of hurting yourself in some way. 0   If you checked off any problems, how difficult have these problems made it for you to do your work, take care of things at home, or get along with other people? Very difficult   PHQ 9 Total 18   Interpretation of Total Score Moderate/Severe depression

## 2022-11-15 NOTE — Telephone Encounter (Signed)
Iviona is out of medication today. She coming in later today, but she says she can't go all day without the klonopin. Please note the attached reorder has 5 refills as the last order did.

## 2022-11-25 ENCOUNTER — Other Ambulatory Visit (HOSPITAL_BASED_OUTPATIENT_CLINIC_OR_DEPARTMENT_OTHER): Payer: Self-pay | Admitting: PHYSICIAN ASSISTANT

## 2022-11-25 DIAGNOSIS — E119 Type 2 diabetes mellitus without complications: Secondary | ICD-10-CM

## 2022-11-25 MED ORDER — METFORMIN ER 750 MG TABLET,EXTENDED RELEASE 24 HR
750.0000 mg | ORAL_TABLET | Freq: Two times a day (BID) | ORAL | 1 refills | Status: DC
Start: 2022-11-25 — End: 2023-02-11

## 2022-11-25 MED ORDER — LEVOTHYROXINE 50 MCG TABLET
50.0000 ug | ORAL_TABLET | Freq: Every morning | ORAL | 0 refills | Status: DC
Start: 2022-11-25 — End: 2023-03-16

## 2022-11-25 MED ORDER — AMLODIPINE 5 MG-OLMESARTAN 20 MG TABLET
1.0000 | ORAL_TABLET | Freq: Every day | ORAL | 0 refills | Status: DC
Start: 2022-11-25 — End: 2023-02-24

## 2022-11-29 ENCOUNTER — Encounter (INDEPENDENT_AMBULATORY_CARE_PROVIDER_SITE_OTHER): Payer: Self-pay | Admitting: PSYCHIATRY

## 2022-12-20 ENCOUNTER — Telehealth (HOSPITAL_BASED_OUTPATIENT_CLINIC_OR_DEPARTMENT_OTHER): Payer: Self-pay | Admitting: PHYSICIAN ASSISTANT

## 2022-12-23 ENCOUNTER — Ambulatory Visit (HOSPITAL_BASED_OUTPATIENT_CLINIC_OR_DEPARTMENT_OTHER): Payer: Self-pay | Admitting: PHYSICIAN ASSISTANT

## 2022-12-30 ENCOUNTER — Other Ambulatory Visit (HOSPITAL_BASED_OUTPATIENT_CLINIC_OR_DEPARTMENT_OTHER): Payer: Self-pay | Admitting: PHYSICIAN ASSISTANT

## 2022-12-30 DIAGNOSIS — H04123 Dry eye syndrome of bilateral lacrimal glands: Secondary | ICD-10-CM

## 2022-12-30 DIAGNOSIS — T7840XA Allergy, unspecified, initial encounter: Secondary | ICD-10-CM

## 2022-12-30 DIAGNOSIS — J3489 Other specified disorders of nose and nasal sinuses: Secondary | ICD-10-CM

## 2023-02-08 ENCOUNTER — Ambulatory Visit (INDEPENDENT_AMBULATORY_CARE_PROVIDER_SITE_OTHER): Payer: Medicare PPO | Admitting: PHYSICIAN ASSISTANT

## 2023-02-08 ENCOUNTER — Other Ambulatory Visit: Payer: Self-pay

## 2023-02-08 ENCOUNTER — Encounter (INDEPENDENT_AMBULATORY_CARE_PROVIDER_SITE_OTHER): Payer: Self-pay | Admitting: PHYSICIAN ASSISTANT

## 2023-02-08 ENCOUNTER — Other Ambulatory Visit (HOSPITAL_BASED_OUTPATIENT_CLINIC_OR_DEPARTMENT_OTHER): Payer: Self-pay | Admitting: PHYSICIAN ASSISTANT

## 2023-02-08 VITALS — Ht 62.0 in | Wt 169.0 lb

## 2023-02-08 DIAGNOSIS — T7840XA Allergy, unspecified, initial encounter: Secondary | ICD-10-CM

## 2023-02-08 DIAGNOSIS — R682 Dry mouth, unspecified: Secondary | ICD-10-CM

## 2023-02-08 DIAGNOSIS — J3489 Other specified disorders of nose and nasal sinuses: Secondary | ICD-10-CM

## 2023-02-08 DIAGNOSIS — F331 Major depressive disorder, recurrent, moderate: Secondary | ICD-10-CM

## 2023-02-08 MED ORDER — DULOXETINE 60 MG CAPSULE,DELAYED RELEASE
60.0000 mg | DELAYED_RELEASE_CAPSULE | Freq: Every day | ORAL | 5 refills | Status: DC
Start: 2023-02-08 — End: 2023-04-11

## 2023-02-08 MED ORDER — OMEPRAZOLE 40 MG CAPSULE,DELAYED RELEASE
40.0000 mg | DELAYED_RELEASE_CAPSULE | Freq: Every day | ORAL | 4 refills | Status: DC
Start: 2023-02-08 — End: 2024-01-03

## 2023-02-08 NOTE — Progress Notes (Signed)
[facility]  [address]  [place] [address]    History and Physical    Name: Elizabeth Page MRN:  [id number]   Date: 02/08/2023 DOB:  1946-01-11 (76 y.o.)           Referring Provider:  [name], PA-C  [address]  [address]  [place],  [place] [address]    Primary Care Provider:  [name], PA-C  [address]  [place] [address]        Chief Complaint:  Allergies ( Allergies Dries her nose and mouth out)      HPI:  Elizabeth Page is a 77 y.o. female being seen for allergies and dry mouth and nose.  She states that she is "allergic to everything. ".  She feels as though her allergies bother her year-round.  She chronically feels congested.  She does not take anything for her allergies.  She has tried several over-the-counter medications in the past and nasal sprays with no benefit.  She also complains of chronic oral dryness and nasal dryness as well.  She gets burning sensations in her mouth and nose.  Her symptoms have been ongoing for the last 5-6 years.  Temperature changes bother her significantly.  She used to be on acid reflux medication has not been on it for some time now.  She does feel as though she was getting regurgitation into her mouth.      Physical Exam:  Body mass index is 30.91 kg/m.  General:           The patient is in no acute distress.  Appears stated age.                               Alert and oriented x 4.  Voice is clear.  The patient is alone.  Neurologic:     Facial nerve is intact bilaterally.  Head:                Normocephalic and atraumatic.  Eyes:                  EOMI.  Ears:                  TM's intact with no fluid or infection.  External canals and pinnas are normal.  Nose:                No mucopus or polyps.  External nose exam reveals no abnormalities.  OC/OP:             Edentulous.                            Healthy lips, tongue and gums; mucosa is moist.                              Tonsils are tiny.  Neck:               No palpable masses,  swelling, tenderness or masses.                            Parotid and submandibular glands are normal.  No obvious palpable thyroid lesions, enlargement or tenderness.  Integument:  The skin is warm and dry.      IMPRESSION:  Allergic rhinitis.      Dry nose and mouth.    Acid reflux.    RECOMMENDATIONS:  For the time being we will avoid any allergy medications as they would likely just dry her out more.  We will trial her on some omeprazole for her uncontrolled acid reflux.  She was provided a sinus rinse bottle and encouraged to use this daily.  She was also encouraged to drink plenty of water and use oral lozenges for her dry mouth.  She has a cool mist humidifier but has not been using it she was encouraged to begin using this as well.  She will return in 1 month for follow up.              Assessment:  Assessment/Plan   1. Dry mouth    2. Sinus drainage    3. Allergies        Plan:  Orders Placed This Encounter    omeprazole (PRILOSEC) 40 mg Oral Capsule, Delayed Release(E.C.)         Leighton Parody, PA-C

## 2023-02-11 ENCOUNTER — Other Ambulatory Visit (HOSPITAL_BASED_OUTPATIENT_CLINIC_OR_DEPARTMENT_OTHER): Payer: Self-pay | Admitting: PHYSICIAN ASSISTANT

## 2023-02-11 DIAGNOSIS — E119 Type 2 diabetes mellitus without complications: Secondary | ICD-10-CM

## 2023-02-11 MED ORDER — METFORMIN ER 750 MG TABLET,EXTENDED RELEASE 24 HR
750.0000 mg | ORAL_TABLET | Freq: Two times a day (BID) | ORAL | 1 refills | Status: DC
Start: 2023-02-11 — End: 2023-07-04

## 2023-02-24 ENCOUNTER — Other Ambulatory Visit (HOSPITAL_BASED_OUTPATIENT_CLINIC_OR_DEPARTMENT_OTHER): Payer: Self-pay | Admitting: PHYSICIAN ASSISTANT

## 2023-02-24 MED ORDER — AMLODIPINE 5 MG-OLMESARTAN 20 MG TABLET
1.0000 | ORAL_TABLET | Freq: Every day | ORAL | 1 refills | Status: DC
Start: 2023-02-24 — End: 2023-07-04

## 2023-03-16 ENCOUNTER — Other Ambulatory Visit (HOSPITAL_BASED_OUTPATIENT_CLINIC_OR_DEPARTMENT_OTHER): Payer: Self-pay | Admitting: PHYSICIAN ASSISTANT

## 2023-03-24 ENCOUNTER — Encounter (INDEPENDENT_AMBULATORY_CARE_PROVIDER_SITE_OTHER): Payer: Self-pay | Admitting: Nurse Practitioner

## 2023-04-11 ENCOUNTER — Other Ambulatory Visit (HOSPITAL_BASED_OUTPATIENT_CLINIC_OR_DEPARTMENT_OTHER): Payer: Self-pay | Admitting: PHYSICIAN ASSISTANT

## 2023-04-11 DIAGNOSIS — F331 Major depressive disorder, recurrent, moderate: Secondary | ICD-10-CM

## 2023-04-11 MED ORDER — DULOXETINE 60 MG CAPSULE,DELAYED RELEASE
60.0000 mg | DELAYED_RELEASE_CAPSULE | Freq: Every day | ORAL | 5 refills | Status: DC
Start: 2023-04-11 — End: 2023-05-13

## 2023-04-15 ENCOUNTER — Telehealth (INDEPENDENT_AMBULATORY_CARE_PROVIDER_SITE_OTHER): Payer: Self-pay | Admitting: PSYCHIATRY

## 2023-04-15 NOTE — Telephone Encounter (Signed)
Elizabeth Page will keep her scheduled appt.

## 2023-04-15 NOTE — Telephone Encounter (Signed)
Elizabeth Page has an appt on 05/13/23, she said her apartment complex is trying to evict her.  She is stressed out about it and is wanting you see you sooner to possibly prescribe something to help her anxiety more.  She's not eating or sleeping because of this stress.

## 2023-05-03 ENCOUNTER — Other Ambulatory Visit (HOSPITAL_BASED_OUTPATIENT_CLINIC_OR_DEPARTMENT_OTHER): Payer: Self-pay | Admitting: PHYSICIAN ASSISTANT

## 2023-05-03 DIAGNOSIS — M4802 Spinal stenosis, cervical region: Secondary | ICD-10-CM

## 2023-05-03 DIAGNOSIS — R52 Pain, unspecified: Secondary | ICD-10-CM

## 2023-05-03 NOTE — Nursing Note (Signed)
Dr Garnette Scheuermann wanting standing, weight bearing   cervical   thoracic lumbar  and  AP pelvis

## 2023-05-13 ENCOUNTER — Ambulatory Visit (INDEPENDENT_AMBULATORY_CARE_PROVIDER_SITE_OTHER): Payer: Medicare PPO | Admitting: PSYCHIATRY

## 2023-05-13 ENCOUNTER — Other Ambulatory Visit: Payer: Self-pay

## 2023-05-13 ENCOUNTER — Encounter (INDEPENDENT_AMBULATORY_CARE_PROVIDER_SITE_OTHER): Payer: Self-pay | Admitting: PSYCHIATRY

## 2023-05-13 VITALS — BP 108/60 | HR 82 | Resp 18 | Ht 61.5 in | Wt 171.0 lb

## 2023-05-13 DIAGNOSIS — F410 Panic disorder [episodic paroxysmal anxiety] without agoraphobia: Secondary | ICD-10-CM

## 2023-05-13 DIAGNOSIS — F3341 Major depressive disorder, recurrent, in partial remission: Secondary | ICD-10-CM

## 2023-05-13 MED ORDER — CLONAZEPAM 0.5 MG TABLET
0.5000 mg | ORAL_TABLET | Freq: Three times a day (TID) | ORAL | 5 refills | Status: DC | PRN
Start: 2023-05-13 — End: 2023-08-05

## 2023-05-13 MED ORDER — DULOXETINE 60 MG CAPSULE,DELAYED RELEASE
60.0000 mg | DELAYED_RELEASE_CAPSULE | Freq: Every day | ORAL | 1 refills | Status: DC
Start: 2023-05-13 — End: 2023-08-05

## 2023-05-13 NOTE — Nursing Note (Signed)
05/13/23 1500   [place] Suicide Severity Rating Scale (Since Last Contact Screener)   1. Wish to be Dead (Since Last Contact) N   2. Non-Specific Active Suicidal Thoughts (Since Last Contact) N   6. Suicidal Behavior (Since Last Contact) N   Calculated C-SSRS Risk Score (Since Last Contact) No Risk Indicated

## 2023-05-13 NOTE — Nursing Note (Signed)
05/13/23 1500   PHQ 9 (follow up)   Little interest or pleasure in doing things. 3   Feeling down, depressed, or hopeless 3   PHQ 2 Total 6   Trouble falling or staying asleep, or sleeping too much. 3   Feeling tired or having little energy 3   Poor appetite or overeating 3   Feeling bad about yourself/ that you are a failure in the past 2 weeks? 3   Trouble concentrating on things in the past 2 weeks? 3   Moving/Speaking slowly or being fidgety or restless  in the past 2 weeks? 3   Thoughts that you would be better off DEAD, or of hurting yourself in some way. 0   If you checked off any problems, how difficult have these problems made it for you to do your work, take care of things at home, or get along with other people? Extremely difficult   PHQ 9 Total 24   Interpretation of Total Score Severe depression

## 2023-05-13 NOTE — Progress Notes (Signed)
[facility]HAVIORAL [facility]ICINE, [facility]  [address]  [place] [address]       Name: MARTINE BEASTON MRN:  [id number]   Date: 05/13/2023 Age: 77 y.o.       History:        Last visit: 11/15/2022    Interval History:  Had another run in with her apartment manager over "bug spraying".  Patient had refused to allow the exterminator in her room since there were no bugs. Says she wants to fight it but is unsure how-- probably will have to represent herself. She had looked for another apartment locally.  Does not get along with sister or brother's wife so really has no where else to go.    Overall though she was not in crisis anymore and feeling better.  She will present her case to the judge and hopefully he will have her evicted.  Moods have been fairly good.  Denies significant depressive symptoms.  Chronic issues with insomnia.    Panic attacks are well controlled at this time.  Denies any medication side effects and wants no changes.  I supported her today.      Substance use: Denies any substance use.    Review of systems:   Constitutional: Denies any medication side effect.  GI: Denies any diarrhea, constipation.  Dermatological:  Denies any skin rash    Medical and Surgical history in the interim: Non-contributory.  Past Medical History:   Diagnosis Date    Anxiety     Depression     Diabetes mellitus, type 2 (CMS HCC)     GERD (gastroesophageal reflux disease)     History of concussion     HTN (hypertension)     Hyperlipidemia     Stress     Ulcerative colitis (CMS HCC)        Past Surgical History:   Procedure Laterality Date    HX CESAREAN SECTION      HX HYSTERECTOMY      ORIF ANKLE FRACTURE      TOTAL ABDOMINAL HYSTERECTOMY         Current Outpatient Medications   Medication Sig Dispense Refill    ACCU-CHEK AVIVA CONTROL SOLN Solution       ACCU-CHEK SOFTCLIX LANCETS Misc       Amlodipine-Olmesartan 5-20 mg Oral Tablet Take 1 Tablet by mouth Once a day 90 Tablet 1    clonazePAM (KLONOPIN) 0.5 mg  Oral Tablet Take 1 Tablet (0.5 mg total) by mouth Three times a day as needed Indications: panic disorder 90 Tablet 5    DULoxetine (CYMBALTA DR) 60 mg Oral Capsule, Delayed Release(E.C.) Take 1 Capsule (60 mg total) by mouth Once a day for 180 days Indications: major depressive disorder 30 Capsule 5    levothyroxine (SYNTHROID) 50 mcg Oral Tablet TAKE 1 TABLET BY MOUTH IN THE MORNING 90 Tablet 0    metformin (GLUCOPHAGE-XR) 750 mg Oral Tablet Sustained Release 24 hr Take 1 Tablet (750 mg total) by mouth Twice daily 180 Tablet 1    naproxen Sodium (ALEVE) 220 mg Oral Tablet Take 1 Tablet (220 mg total) by mouth Twice daily with food      omeprazole (PRILOSEC) 40 mg Oral Capsule, Delayed Release(E.C.) Take 1 Capsule (40 mg total) by mouth Once a day 90 Capsule 4     No current facility-administered medications for this visit.        Allergies:   Allergies   Allergen Reactions    Allergy-Decongestant [Dexbrompheniramine-Pseudoephed]  Codeine     Cortisone     Motrin [Ibuprofen]        Exam:      Ht 1.562 m (5' 1.5")   Wt 77.6 kg (171 lb)   BMI 31.79 kg/m       General appearance: Well dressed, well groomed.  Cooperative, good eye contact.  Musculoskeletal: Normal gait and station. Normal muscle strength and tone.  Speech: Articulate, appropriate with spontaneous elaborations.  Normal rate and volume  Affect: Congruent with mood which is described as "okay I guess".  Slight tension wishes stable for her.  Good range to affect.  Thought process: Logical, linear and goal directed.  Association: Intact  Abnormal thought: None/denies hopelessness, death wishes or suicidal ideations.  Denies violent thoughts or psychotic symptoms.  Judgement and Insight:  Both are intact  Attention and concentration: Adequate  Language: Fluent use of English.    Assessment: Diagnosis: unchanged.  Overall psychiatrically stable.  Psychologically a bit stressed as above.    Substance use is not a concern. No legal issues. No acute  suicide risk. Discussed status and reviewed options for therapy. Discussed medications, psychotherapy and structures/schedule of program. Reviewed plan, goals, rationale, risks, benefits, and alternatives. Patient indicates understanding and agreement. Has decision making capacity.        ICD-10-CM    1. Panic disorder  F41.0       2. Recurrent major depressive disorder, in partial remission (CMS HCC)  F33.41            Plan:   Medications:  Continue current.  Therapy:  Continue current supportive work.  RTC:  6 months/call p.r.n..  Labs/test/imagining: None psychiatrically indicated.  I spent approximately 20 minutes on this case today with the patient present over 66% of that time and we discussed her treatment plan, issues, medications, potential medication adverse effects and I obtained informed consent.  Over 50% of her time was spent on counseling, supportive psychotherapy and patient Education.    Latina [name], MD      This note was partially created using voice recognition software and is inherently subject to errors including those of syntax and "sound-alike" substitutions which may escape proofreading.  In such instances, original meaning may be extrapolated by contextual derivation.

## 2023-05-16 ENCOUNTER — Other Ambulatory Visit (HOSPITAL_BASED_OUTPATIENT_CLINIC_OR_DEPARTMENT_OTHER): Payer: Self-pay | Admitting: PHYSICIAN ASSISTANT

## 2023-05-31 ENCOUNTER — Other Ambulatory Visit (HOSPITAL_BASED_OUTPATIENT_CLINIC_OR_DEPARTMENT_OTHER): Payer: Self-pay | Admitting: PHYSICIAN ASSISTANT

## 2023-05-31 DIAGNOSIS — E1165 Type 2 diabetes mellitus with hyperglycemia: Secondary | ICD-10-CM

## 2023-05-31 DIAGNOSIS — H269 Unspecified cataract: Secondary | ICD-10-CM

## 2023-05-31 NOTE — Nursing Note (Signed)
Elizabeth Page called requesting a referral to Dr Providence Crosby, she is c/o eye pain, vision changes, called his office and said they need to have [place] refer her since she hasn't been seen there in 10 years

## 2023-06-09 ENCOUNTER — Other Ambulatory Visit (HOSPITAL_BASED_OUTPATIENT_CLINIC_OR_DEPARTMENT_OTHER): Payer: Self-pay | Admitting: PHYSICIAN ASSISTANT

## 2023-06-13 ENCOUNTER — Telehealth (HOSPITAL_BASED_OUTPATIENT_CLINIC_OR_DEPARTMENT_OTHER): Payer: Self-pay | Admitting: PHYSICIAN ASSISTANT

## 2023-06-23 ENCOUNTER — Other Ambulatory Visit (HOSPITAL_BASED_OUTPATIENT_CLINIC_OR_DEPARTMENT_OTHER): Payer: Self-pay | Admitting: PHYSICIAN ASSISTANT

## 2023-06-23 MED ORDER — LEVOTHYROXINE 50 MCG TABLET
50.0000 ug | ORAL_TABLET | Freq: Every morning | ORAL | 0 refills | Status: DC
Start: 2023-06-23 — End: 2023-07-04

## 2023-06-27 ENCOUNTER — Ambulatory Visit (HOSPITAL_BASED_OUTPATIENT_CLINIC_OR_DEPARTMENT_OTHER): Payer: Self-pay | Admitting: PHYSICIAN ASSISTANT

## 2023-07-02 ENCOUNTER — Other Ambulatory Visit (HOSPITAL_BASED_OUTPATIENT_CLINIC_OR_DEPARTMENT_OTHER): Payer: Self-pay | Admitting: PHYSICIAN ASSISTANT

## 2023-07-02 DIAGNOSIS — E119 Type 2 diabetes mellitus without complications: Secondary | ICD-10-CM

## 2023-07-04 ENCOUNTER — Telehealth (HOSPITAL_BASED_OUTPATIENT_CLINIC_OR_DEPARTMENT_OTHER): Payer: Self-pay | Admitting: PHYSICIAN ASSISTANT

## 2023-07-18 ENCOUNTER — Ambulatory Visit (HOSPITAL_BASED_OUTPATIENT_CLINIC_OR_DEPARTMENT_OTHER): Payer: Self-pay | Admitting: PHYSICIAN ASSISTANT

## 2023-07-18 ENCOUNTER — Other Ambulatory Visit: Payer: Self-pay

## 2023-07-18 ENCOUNTER — Encounter (HOSPITAL_COMMUNITY): Payer: Self-pay

## 2023-07-18 ENCOUNTER — Emergency Department
Admission: EM | Admit: 2023-07-18 | Discharge: 2023-07-18 | Disposition: A | Discharge status: Home / Self Care | Payer: Medicare PPO | Attending: Physician Assistant | Admitting: Physician Assistant

## 2023-07-18 ENCOUNTER — Emergency Department (HOSPITAL_COMMUNITY): Payer: Medicare PPO

## 2023-07-18 DIAGNOSIS — Z77098 Contact with and (suspected) exposure to other hazardous, chiefly nonmedicinal, chemicals: Secondary | ICD-10-CM | POA: Insufficient documentation

## 2023-07-18 NOTE — ED Provider Notes (Signed)
Emergency Department  Provider Note    Name: Elizabeth Page      Subjective  History of Present Illness    Elizabeth Page is a 77 y.o. female  who presents to the ED today for breathing problem  Patient states she has a gnats in her apartment and the owner had someone come in and spray about 2 weeks ago.  Feels like her breathing has been off since then like she is having a reaction to the chemical they used.  Difficult historian and very preoccupied with gnats in her apartment as well as discussing many of her chronic health concerns.            Historical Data   Below information reviewed with patient where pertinent:  Past Medical History:   Diagnosis Date    Anxiety     Depression     Diabetes mellitus, type 2 (CMS HCC)     GERD (gastroesophageal reflux disease)     History of concussion     HTN (hypertension)     Hyperlipidemia     Stress     Ulcerative colitis (CMS HCC)        Current Outpatient Medications   Medication Sig    ACCU-CHEK AVIVA CONTROL SOLN Solution     ACCU-CHEK SOFTCLIX LANCETS Misc     Amlodipine-Olmesartan 5-20 mg Oral Tablet Take 1 tablet by mouth once daily    clonazePAM (KLONOPIN) 0.5 mg Oral Tablet Take 1 Tablet (0.5 mg total) by mouth Three times a day as needed Indications: panic disorder    DULoxetine (CYMBALTA DR) 60 mg Oral Capsule, Delayed Release(E.C.) Take 1 Capsule (60 mg total) by mouth Once a day Indications: major depressive disorder    levothyroxine (SYNTHROID) 50 mcg Oral Tablet TAKE 1 TABLET BY MOUTH IN THE MORNING    metformin (GLUCOPHAGE-XR) 750 mg Oral Tablet Sustained Release 24 hr Take 1 tablet by mouth twice daily    naproxen Sodium (ALEVE) 220 mg Oral Tablet Take 1 Tablet (220 mg total) by mouth Twice daily with food    omeprazole (PRILOSEC) 40 mg Oral Capsule, Delayed Release(E.C.) Take 1 Capsule (40 mg total) by mouth Once a day       Allergies   Allergen Reactions    Allergy-Decongestant [Dexbrompheniramine-Pseudoephed]     Codeine     Cortisone     Motrin  [Ibuprofen]        Past Surgical History:   Procedure Laterality Date    HX CESAREAN SECTION      HX HYSTERECTOMY      ORIF ANKLE FRACTURE      TOTAL ABDOMINAL HYSTERECTOMY         Social History     Tobacco Use    Smoking status: Never    Smokeless tobacco: Never   Substance Use Topics    Alcohol use: No    Drug use: Never              Objective  Physical Exam   Filed Vitals:    07/18/23 1932   BP: 103/88   Pulse: 100   Resp: 14   Temp: 36.3 C (97.3 F)   SpO2: 99%       Physical Exam  Vital signs Reviewed.   Constitutional: NAD.   HENT:   Head: Normocephalic and atraumatic   Mouth/Throat: Oropharynx is clear and moist   Eyes: EOMI, conjunctivae without discharge bilaterally  Neck: Trachea midline   Cardiovascular: RRR, No murmurs, rubs  or gallops   Pulmonary/Chest: BS equal bilaterally, good air movement. No respiratory distress. No wheezes, rales or chest tenderness   Musculoskeletal: No edema, tenderness or deformity  Skin: warm and dry. No rash, erythema, pallor or cyanosis  Psychiatric:  Tangential, requires frequent redirection  Neurological: Alert. Grossly intact         Orders and Results  Patient Data    Labs Ordered/Reviewed - No data to display    Imaging:  XR CHEST PA AND LATERAL   Final Result by Edi, Radresults In (09/30 2138)   NO ACUTE FINDINGS.            Radiologist location ID: [id number]              Nursing notes, labs, and imaging reviewed.    Orders:  Orders Placed This Encounter    XR CHEST PA AND LATERAL           Medical Decision Making   Medical Decision Making  In brief, Elizabeth Page is a 77 y.o. female who presented to the ED for breathing problem.       Nontoxic appearing vital signs stable.  Lungs CTAB.    X-ray chest PA and lateral nonacute.  Possible exposure to insect spray.  Advised to clean surfaces in home.      Medical Decision Making  Amount and/or Complexity of Data Reviewed  Radiology: ordered. Decision-making details documented in ED Course.          Clinical  Impression:     Clinical Impression   Chemical exposure (Primary)       Disposition: Discharged    Follow up:   No follow-up provider specified.             History reviewed in chart.  Parts of this patient's chart were completed in a retrospective fashion due to simultaneous direct patient care activities in the Emergency Department.

## 2023-07-18 NOTE — ED Triage Notes (Signed)
Patient states that she has chronic issues with pressure in her head and her nose and throat being very dry, has an upcoming appt with Dr. Toy Baker, but for the past 3 days she has been feeling like it is difficult to breathe

## 2023-07-19 ENCOUNTER — Telehealth (HOSPITAL_COMMUNITY): Payer: Self-pay | Admitting: PHYSICIAN ASSISTANT

## 2023-07-19 NOTE — ED Nurses Note (Signed)
Patient discharged home with family.  AVS reviewed with patient/care giver.  A written copy of the AVS and discharge instructions was given to the patient/care giver.  Questions sufficiently answered as needed.  Patient/care giver encouraged to follow up with PCP as indicated.  In the event of an emergency, patient/care giver instructed to call 911 or go to the nearest emergency room.

## 2023-07-19 NOTE — Telephone Encounter (Signed)
Post Ed Follow-Up    Post ED Follow-Up:   Document completed and/or attempted interactive contact(s) after transition to home after emergency department stay.:   Transition Facility and relevant Date:   Discharge Date: 07/18/23  Discharge from [facility] Emergency Department?: Yes  Discharge Facility: [facility]  Contacted by: Huel Coventry, RN  Contact method: Patient/Caregiver Telephone  Contact completed: 07/19/2023 12:54 PM  MyChart message sent?: No  Was the AVS reviewed with patient?: Yes  How is the patient recovering?: Improving  Medications prescribed: No  Interventions: No needs identified  Patient plans to make f/u appt       Huel Coventry, RN

## 2023-08-03 ENCOUNTER — Telehealth (INDEPENDENT_AMBULATORY_CARE_PROVIDER_SITE_OTHER): Payer: Self-pay | Admitting: PSYCHIATRY

## 2023-08-03 NOTE — Telephone Encounter (Signed)
Elizabeth Page would like to be seen sooner than Jan.  She has been having panic attacks, crying and not sleeping.

## 2023-08-05 ENCOUNTER — Encounter (INDEPENDENT_AMBULATORY_CARE_PROVIDER_SITE_OTHER): Payer: Self-pay | Admitting: PSYCHIATRY

## 2023-08-05 ENCOUNTER — Other Ambulatory Visit: Payer: Self-pay

## 2023-08-05 ENCOUNTER — Ambulatory Visit (INDEPENDENT_AMBULATORY_CARE_PROVIDER_SITE_OTHER): Payer: Medicare PPO | Admitting: PSYCHIATRY

## 2023-08-05 ENCOUNTER — Ambulatory Visit (HOSPITAL_BASED_OUTPATIENT_CLINIC_OR_DEPARTMENT_OTHER): Payer: Self-pay | Admitting: PHYSICIAN ASSISTANT

## 2023-08-05 VITALS — BP 104/74 | HR 99 | Resp 18 | Ht 62.5 in | Wt 161.0 lb

## 2023-08-05 DIAGNOSIS — G479 Sleep disorder, unspecified: Secondary | ICD-10-CM

## 2023-08-05 DIAGNOSIS — R4589 Other symptoms and signs involving emotional state: Secondary | ICD-10-CM

## 2023-08-05 DIAGNOSIS — F3341 Major depressive disorder, recurrent, in partial remission: Secondary | ICD-10-CM

## 2023-08-05 DIAGNOSIS — F4322 Adjustment disorder with anxiety: Secondary | ICD-10-CM

## 2023-08-05 DIAGNOSIS — F410 Panic disorder [episodic paroxysmal anxiety] without agoraphobia: Secondary | ICD-10-CM

## 2023-08-05 MED ORDER — MIRTAZAPINE 15 MG TABLET
15.0000 mg | ORAL_TABLET | Freq: Every evening | ORAL | 1 refills | Status: DC
Start: 2023-08-05 — End: 2023-09-27

## 2023-08-05 MED ORDER — DULOXETINE 60 MG CAPSULE,DELAYED RELEASE
60.0000 mg | DELAYED_RELEASE_CAPSULE | Freq: Every day | ORAL | 1 refills | Status: DC
Start: 2023-08-05 — End: 2023-09-27

## 2023-08-05 MED ORDER — CLONAZEPAM 0.5 MG TABLET
0.5000 mg | ORAL_TABLET | Freq: Three times a day (TID) | ORAL | 5 refills | Status: DC | PRN
Start: 2023-08-05 — End: 2023-09-27

## 2023-08-05 NOTE — Nursing Note (Signed)
08/05/23 1247   [place] Suicide Severity Rating Scale (Since Last Contact Screener)   1. Wish to be Dead (Since Last Contact) N   2. Non-Specific Active Suicidal Thoughts (Since Last Contact) N   6. Suicidal Behavior (Since Last Contact) N   Calculated C-SSRS Risk Score (Since Last Contact) No Risk Indicated

## 2023-08-05 NOTE — Nursing Note (Signed)
08/05/23 1249   PHQ 9 (follow up)   Little interest or pleasure in doing things. 3   Feeling down, depressed, or hopeless 3   PHQ 2 Total 6   Trouble falling or staying asleep, or sleeping too much. 3   Feeling tired or having little energy 3   Poor appetite or overeating 3   Feeling bad about yourself/ that you are a failure in the past 2 weeks? 0   Trouble concentrating on things in the past 2 weeks? 0   Moving/Speaking slowly or being fidgety or restless  in the past 2 weeks? 3   Thoughts that you would be better off DEAD, or of hurting yourself in some way. 0   If you checked off any problems, how difficult have these problems made it for you to do your work, take care of things at home, or get along with other people? Extremely difficult   PHQ 9 Total 18   Interpretation of Total Score Moderate/Severe depression

## 2023-08-05 NOTE — Progress Notes (Signed)
BEHAVIORAL MEDICINE, [facility]  [address]  [place] [address]       Name: Elizabeth Page MRN:  [id number]   Date: 08/05/2023 Age: 77 y.o.       History:        Last visit: 05/13/2023    Interval History:  She added on this appointment today as she is going to be evicted 08/17/2023 from the "[place] [facility]". She had been complaining about "gnats" there. Has an application for an apartment near son in [place] (20 minutes away). To have another hearing before a judge and said she had one before and had a "panic attack".    Is not able to sleep and is not eating well. Has lost 11 pounds in less than a month. Fleeting hopelessness but she denies death wishes or SI.    I reviewed her record and she did respond her mirtazapine 15 mg at HS in 2022 2021.  She simply stopped taking it and was unaware of the time.  I think it should treat her depressive symptoms which have increased recently.  I do not want to give her a neuroleptic.  She was not agitated today.  She was just very stressed.    I worked with her on her need to control her temper and to avoid conflict.  She said those are her intentions.    Panic attack issues remain a problem for the patient and she was taking Klonopin 0.5 mg t.i.d..  I told her to take it as needed only and she said "I need it due to my stress".  No alcohol or drug use.  Denies any problems with balance or gait.      Substance use: Denies any substance use.    Review of systems:   Constitutional: Denies any medication side effect.  GI: Denies any diarrhea, constipation.  Dermatological:  Denies any skin rash    Medical and Surgical history in the interim: Non-contributory.  Past Medical History:   Diagnosis Date    Anxiety     Depression     Diabetes mellitus, type 2 (CMS HCC)     GERD (gastroesophageal reflux disease)     History of concussion     HTN (hypertension)     Hyperlipidemia     Stress     Ulcerative colitis (CMS HCC)        Past Surgical History:   Procedure  Laterality Date    HX CESAREAN SECTION      HX HYSTERECTOMY      ORIF ANKLE FRACTURE      TOTAL ABDOMINAL HYSTERECTOMY         Current Outpatient Medications   Medication Sig Dispense Refill    ACCU-CHEK AVIVA CONTROL SOLN Solution       ACCU-CHEK SOFTCLIX LANCETS Misc       Amlodipine-Olmesartan 5-20 mg Oral Tablet Take 1 tablet by mouth once daily 90 Tablet 0    clonazePAM (KLONOPIN) 0.5 mg Oral Tablet Take 1 Tablet (0.5 mg total) by mouth Three times a day as needed Indications: panic disorder 90 Tablet 5    DULoxetine (CYMBALTA DR) 60 mg Oral Capsule, Delayed Release(E.C.) Take 1 Capsule (60 mg total) by mouth Once a day Indications: major depressive disorder 90 Capsule 1    levothyroxine (SYNTHROID) 50 mcg Oral Tablet TAKE 1 TABLET BY MOUTH IN THE MORNING 90 Tablet 0    metformin (GLUCOPHAGE-XR) 750 mg Oral Tablet Sustained Release 24 hr Take 1 tablet  by mouth twice daily 180 Tablet 0    mirtazapine (REMERON) 15 mg Oral Tablet Take 1 Tablet (15 mg total) by mouth Every night Indications: major depressive disorder 90 Tablet 1    naproxen Sodium (ALEVE) 220 mg Oral Tablet Take 1 Tablet (220 mg total) by mouth Twice daily with food      omeprazole (PRILOSEC) 40 mg Oral Capsule, Delayed Release(E.C.) Take 1 Capsule (40 mg total) by mouth Once a day 90 Capsule 4     No current facility-administered medications for this visit.        Allergies:   Allergies   Allergen Reactions    Allergy-Decongestant [Dexbrompheniramine-Pseudoephed]     Codeine     Cortisone     Motrin [Ibuprofen]        Exam:      BP 104/74   Pulse 99   Resp 18   Ht 1.588 m (5' 2.5")   Wt 73 kg (161 lb)   SpO2 98%   BMI 28.98 kg/m       General appearance: Well dressed, well groomed.  Good eye contact, cooperative.  A bit hard of hearing.  Musculoskeletal: Normal gait and station. Normal muscle strength and tone.  No abnormal movements.  Speech: Articulate, appropriate with spontaneous elaborations.  Slight increase in rate but that is  typical for her, normal volume.  Affect: Congruent with mood which is described as "a mess".  Somewhat anxious affect, fair range.  Thought process: Logical, linear and goal directed.  Association: Intact  Abnormal thought: None/denies hopelessness, death wishes or suicidal ideations.  Denies violent thoughts or psychotic symptoms.  Judgement and Insight:  Judgment is somewhat limited at times as is insight.  Tends to not realize that when she "speaks her mind" it causes conflict.  Attention and concentration: Adequate  Language: Fluent use of English.    Assessment: Diagnosis: unchanged.   Psychologically somewhat stressed, psychiatrically slight decline in mood/increase depressive symptoms as above.    Substance use is not a concern. No legal issues. No acute suicide risk. Discussed status and reviewed options for therapy. Discussed medications, psychotherapy and structures/schedule of program. Reviewed plan, goals, rationale, risks, benefits, and alternatives. Patient indicates understanding and agreement. Has decision making capacity.        ICD-10-CM    1. Recurrent major depressive disorder, in partial remission (CMS HCC)  F33.41       2. Panic disorder  F41.0 clonazePAM (KLONOPIN) 0.5 mg Oral Tablet      3. 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 q.h.s..  Continue Cymbalta 60 mg daily and Klonopin 0.5 mg t.i.d. p.r.n. panic attack.  Therapy:  Continue current supportive work.  RTC:  6 weeks  Labs/test/imagining: None psychiatrically indicated.  I spent approximately 25 minutes on this complicated case today with the patient present over 66% of that time and we discussed her treatment plan, issues, medications, potential medication adverse effects and I obtained informed consent.  Over 50% of her time was spent on counseling, supportive psychotherapy and patient Education.    Latina [name], MD      This note was partially created using voice  recognition software and is inherently subject to errors including those of syntax and "sound-alike" substitutions which may escape proofreading.  In such instances, original meaning may be extrapolated by contextual derivation.

## 2023-08-22 ENCOUNTER — Other Ambulatory Visit (HOSPITAL_COMMUNITY): Payer: Self-pay | Admitting: PHYSICIAN ASSISTANT

## 2023-08-31 ENCOUNTER — Emergency Department
Admission: EM | Admit: 2023-08-31 | Discharge: 2023-08-31 | Disposition: A | Discharge status: Home / Self Care | Payer: Medicare PPO | Attending: Physician Assistant | Admitting: Physician Assistant

## 2023-08-31 ENCOUNTER — Emergency Department (HOSPITAL_COMMUNITY): Payer: Medicare PPO

## 2023-08-31 ENCOUNTER — Encounter (HOSPITAL_COMMUNITY): Payer: Self-pay

## 2023-08-31 ENCOUNTER — Other Ambulatory Visit: Payer: Self-pay

## 2023-08-31 DIAGNOSIS — I700 Atherosclerosis of aorta: Secondary | ICD-10-CM | POA: Insufficient documentation

## 2023-08-31 DIAGNOSIS — M5416 Radiculopathy, lumbar region: Secondary | ICD-10-CM | POA: Insufficient documentation

## 2023-08-31 LAB — CBC WITH DIFF
BASOPHIL #: <0.1 10*3/uL (ref <=0.20)
BASOPHIL %: 1 %
EOSINOPHIL #: 0.22 10*3/uL (ref <=0.50)
EOSINOPHIL %: 3.1 %
HCT: 33.7 % — ABNORMAL LOW (ref 34.8–46.0)
HGB: 10.7 g/dL — ABNORMAL LOW (ref 11.5–16.0)
IMMATURE GRANULOCYTE #: <0.1 10*3/uL (ref <0.10)
IMMATURE GRANULOCYTE %: 1.3 % — ABNORMAL HIGH (ref 0.0–1.0)
LYMPHOCYTE #: 2.32 10*3/uL (ref 1.00–4.80)
LYMPHOCYTE %: 32.2 %
MCH: 26.2 pg (ref 26.0–32.0)
MCHC: 31.8 g/dL (ref 31.0–35.5)
MCV: 82.6 fL (ref 78.0–100.0)
MONOCYTE #: 0.91 10*3/uL (ref 0.20–1.10)
MONOCYTE %: 12.6 %
MPV: 9.3 fL (ref 8.7–12.5)
NEUTROPHIL #: 3.59 10*3/uL (ref 1.50–7.70)
NEUTROPHIL %: 49.8 %
PLATELETS: 272 10*3/uL (ref 150–400)
RBC: 4.08 10*6/uL (ref 3.85–5.22)
RDW-CV: 13.3 % (ref 11.5–15.5)
WBC: 7.2 10*3/uL (ref 3.7–11.0)

## 2023-08-31 LAB — PT/INR
INR: 0.92 (ref 0.83–1.16)
PROTHROMBIN TIME: 10.5 s (ref 9.5–13.4)

## 2023-08-31 LAB — COMPREHENSIVE METABOLIC PANEL, NON-FASTING
ALBUMIN: 3.5 g/dL (ref 3.4–4.8)
ALKALINE PHOSPHATASE: 81 U/L (ref 55–145)
ALT (SGPT): 36 U/L — ABNORMAL HIGH (ref <31)
ANION GAP: 10 mmol/L (ref 4–13)
AST (SGOT): 41 U/L — ABNORMAL HIGH (ref 11–34)
BILIRUBIN TOTAL: 0.4 mg/dL (ref 0.3–1.3)
BUN/CREA RATIO: 11 (ref 6–22)
BUN: 10 mg/dL (ref 8–25)
CALCIUM: 9.2 mg/dL (ref 8.6–10.3)
CHLORIDE: 98 mmol/L (ref 96–111)
CO2 TOTAL: 26 mmol/L (ref 23–31)
CREATININE: 0.95 mg/dL (ref 0.60–1.05)
ESTIMATED GFR - FEMALE: 62 mL/min/BSA (ref >=60)
GLUCOSE: 183 mg/dL — ABNORMAL HIGH (ref 65–125)
POTASSIUM: 4.9 mmol/L (ref 3.5–5.1)
PROTEIN TOTAL: 6.7 g/dL (ref 6.0–8.0)
SODIUM: 134 mmol/L — ABNORMAL LOW (ref 136–145)

## 2023-08-31 LAB — CREATINE KINASE (CK), TOTAL, SERUM OR PLASMA: CREATINE KINASE: 68 U/L (ref 25–190)

## 2023-08-31 MED ORDER — IOPAMIDOL 370 MG IODINE/ML (76 %) INTRAVENOUS SOLUTION
125.0000 mL | INTRAVENOUS | Status: AC
Start: 2023-08-31 — End: 2023-08-31
  Administered 2023-08-31: 125 mL via INTRAVENOUS

## 2023-08-31 NOTE — ED Nurses Note (Signed)
The patient was discharged per MD orders by RN. All discharge instructions reviewed with patient/ family, all questions answered at this time. Pt/ family state clear understanding of discharge instructions. PIV removed and dressed appropriately. Patient A&Ox4 upon discharge.

## 2023-08-31 NOTE — ED Nurses Note (Signed)
Pt assisted to cab.

## 2023-08-31 NOTE — ED Nurses Note (Signed)
Pt up and ambulatory to bathroom.

## 2023-08-31 NOTE — ED Nurses Note (Signed)
Pt returned from CT °

## 2023-08-31 NOTE — ED Triage Notes (Signed)
Brought to ed by ems. Pt c/o bilat leg pain x 3 days. Denies injury or fall.

## 2023-08-31 NOTE — ED Provider Notes (Signed)
Emergency Department  Provider Note    Name: Elizabeth Page      Subjective  History of Present Illness    Elizabeth Page is a 77 y.o. female  who presents to the ED today for bilateral leg pain  Bilateral leg pain for a few days.  Occurs when the patient stands up.  Goes down both legs from hips to feet.  Feels sharp.  Patient states that she has difficulty standing long enough to cook.  She takes a leave twice a day due to history of some back pain.  She did see a chiropractor previously.  She denies any new falls or injuries.  She does feel that when she stands up her legs immediately looks swollen but when she sits down they do not.  She denies redness or warmth of the legs.  Denies any chest or abdominal pain.            Historical Data   Below information reviewed with patient where pertinent:  Past Medical History:   Diagnosis Date    Anxiety     Depression     Diabetes mellitus, type 2 (CMS HCC)     GERD (gastroesophageal reflux disease)     History of concussion     HTN (hypertension)     Hyperlipidemia     Stress     Ulcerative colitis (CMS HCC)        Current Outpatient Medications   Medication Sig    ACCU-CHEK AVIVA CONTROL SOLN Solution     ACCU-CHEK SOFTCLIX LANCETS Misc     Amlodipine-Olmesartan 5-20 mg Oral Tablet TAKE 1 TABLET BY MOUTH DAILY    clonazePAM (KLONOPIN) 0.5 mg Oral Tablet Take 1 Tablet (0.5 mg total) by mouth Three times a day as needed Indications: panic disorder    DULoxetine (CYMBALTA DR) 60 mg Oral Capsule, Delayed Release(E.C.) Take 1 Capsule (60 mg total) by mouth Once a day Indications: major depressive disorder    levothyroxine (SYNTHROID) 50 mcg Oral Tablet TAKE 1 TABLET BY MOUTH IN THE MORNING    metformin (GLUCOPHAGE-XR) 750 mg Oral Tablet Sustained Release 24 hr Take 1 tablet by mouth twice daily    mirtazapine (REMERON) 15 mg Oral Tablet Take 1 Tablet (15 mg total) by mouth Every night Indications: major depressive disorder    naproxen Sodium (ALEVE) 220 mg Oral Tablet Take  1 Tablet (220 mg total) by mouth Twice daily with food    omeprazole (PRILOSEC) 40 mg Oral Capsule, Delayed Release(E.C.) Take 1 Capsule (40 mg total) by mouth Once a day       Allergies   Allergen Reactions    Allergy-Decongestant [Dexbrompheniramine-Pseudoephed]     Codeine     Cortisone     Motrin [Ibuprofen]        Past Surgical History:   Procedure Laterality Date    HX CESAREAN SECTION      HX HYSTERECTOMY      ORIF ANKLE FRACTURE      TOTAL ABDOMINAL HYSTERECTOMY         Social History     Tobacco Use    Smoking status: Never    Smokeless tobacco: Never   Substance Use Topics    Alcohol use: No    Drug use: Never              Objective  Physical Exam   Filed Vitals:    08/31/23 1250   BP: (!) 128/97   Pulse: 87  Resp: 16   Temp: 36.8 C (98.2 F)   SpO2: 97%       Physical Exam  Vital signs Reviewed.   Constitutional: NAD.   HENT:   Head: Normocephalic and atraumatic   Mouth/Throat: Oropharynx is clear and moist   Eyes: EOMI, conjunctivae without discharge bilaterally  Neck: Trachea midline   Cardiovascular: RRR, No murmurs, rubs or gallops   Pulmonary/Chest: BS equal bilaterally, good air movement. No respiratory distress. No wheezes, rales or chest tenderness   Abdominal: Abdomen soft, no tenderness, rebound or guarding  Musculoskeletal: No edema, tenderness or deformity.  Bilateral DP and PT pulses 2+.  Capillary refill of toes 3 seconds with sensation intact.  Difficult to palpate bilateral femoral pulses.  Minimal midline lumbar spine tenderness.  Patient was able to stand independently and take several steps.  Skin: warm and dry. No rash, erythema, pallor or cyanosis  Psychiatric: normal mood and affect. Behavior is normal   Neurological: Alert&Ox3. Grossly intact         Orders and Results  Patient Data    Labs Ordered/Reviewed   COMPREHENSIVE METABOLIC PANEL, NON-FASTING - Abnormal; Notable for the following components:       Result Value    SODIUM 134 (*)     GLUCOSE 183 (*)     ALT (SGPT) 36 (*)      AST (SGOT)  41 (*)     All other components within normal limits   CBC WITH DIFF - Abnormal; Notable for the following components:    HGB 10.7 (*)     HCT 33.7 (*)     IMMATURE GRANULOCYTE % 1.3 (*)     All other components within normal limits   PT/INR - Normal    Narrative:     In the setting of warfarin therapy, a moderate-intensity INR goal range is 2.0 to 3.0 and a high-intensity INR goal range is 2.5 to 3.5.                                    INR is ONLY validated to determine the level of anticoagulation with vitamin K antagonists (warfarin). Other factors may elevate the INR including but not limited to direct oral anticoagulants (DOACs), liver dysfunction, vitamin K deficiency, DIC, factor deficiencies, and factor inhibitors.   CREATINE KINASE (CK), TOTAL, SERUM OR PLASMA - Normal   CBC/DIFF    Narrative:     The following orders were created for panel order CBC/DIFF.                  Procedure                               Abnormality         Status                                     ---------                               -----------         ------  CBC WITH FAOZ[[id number]]                Abnormal            Final result                                                 Please view results for these tests on the individual orders.       Imaging:  CT ANGIO ABD/PELVIS/RUN-OFF W IV CONTRAST   Final Result by Edi, Radresults In (11/13 1514)   MINIMAL PLAQUE. THERE IS ESSENTIALLY NORMAL APPEARANCE OF THE AORTA, BILATERAL LOWER EXTREMITIES, AND A NORMAL 3 VESSEL RUNOFF BILATERALLY.         One or more dose reduction techniques were used (e.g., Automated exposure control, adjustment of the mA and/or kV according to patient size, use of iterative reconstruction technique).         Radiologist location ID: [id number]              Nursing notes, labs, and imaging reviewed.    Orders:  Orders Placed This Encounter    CT ANGIO ABD/PELVIS/RUN-OFF W IV CONTRAST    CBC/DIFF     COMPREHENSIVE METABOLIC PANEL, NON-FASTING    PT/INR    CREATINE KINASE (CK), TOTAL, SERUM    CBC WITH DIFF    Referral for EXTERNAL Physical Therapy / Occupational Therapy    INSERT & MAINTAIN PERIPHERAL IV ACCESS    iopamidol (ISOVUE-370) 76% infusion           Medical Decision Making   Medical Decision Making  In brief, Elizabeth Page is a 77 y.o. female who presented to the ED for bilateral leg pain.       Nontoxic appearing vital signs stable.    Difficult to palpate bilateral femoral pulses.  Ordered CT angio of the abdomen pelvis with runoff with IV contrast which was grossly unremarkable.    CBC, CMP, CK, PT INR unremarkable.    Suspect lumbar radiculopathy.  Patient is already taking leave twice per day.  Instructed on topical treatments like lidocaine patches and ice.  Patient states she can not apply heat due to her thyroid condition.  Placed an order for physical therapy.  Recommended PCP follow-up.      Medical Decision Making  Amount and/or Complexity of Data Reviewed  Labs: ordered. Decision-making details documented in ED Course.  Radiology: ordered. Decision-making details documented in ED Course.          Clinical Impression:     Clinical Impression   Lumbar radiculopathy (Primary)       Disposition: Discharged    Follow up:   [name], PA-C  [address]  [address]  [name] [address]  [phone]    Schedule an appointment as soon as possible for a visit                  History reviewed in chart.  Parts of this patient's chart were completed in a retrospective fashion due to simultaneous direct patient care activities in the Emergency Department.

## 2023-08-31 NOTE — Discharge Instructions (Signed)
Continue aleve, lidocaine patches, ice, heat.

## 2023-09-05 ENCOUNTER — Emergency Department (HOSPITAL_COMMUNITY): Payer: Medicare PPO

## 2023-09-05 ENCOUNTER — Emergency Department
Admission: EM | Admit: 2023-09-05 | Discharge: 2023-09-05 | Disposition: A | Discharge status: Home / Self Care | Payer: Medicare PPO | Attending: Emergency Medicine | Admitting: Emergency Medicine

## 2023-09-05 ENCOUNTER — Encounter (HOSPITAL_COMMUNITY): Payer: Self-pay

## 2023-09-05 ENCOUNTER — Other Ambulatory Visit: Payer: Self-pay

## 2023-09-05 DIAGNOSIS — W19XXXA Unspecified fall, initial encounter: Secondary | ICD-10-CM

## 2023-09-05 DIAGNOSIS — W1830XA Fall on same level, unspecified, initial encounter: Secondary | ICD-10-CM

## 2023-09-05 DIAGNOSIS — M545 Low back pain, unspecified: Secondary | ICD-10-CM | POA: Insufficient documentation

## 2023-09-05 DIAGNOSIS — Z8781 Personal history of (healed) traumatic fracture: Secondary | ICD-10-CM | POA: Insufficient documentation

## 2023-09-05 DIAGNOSIS — Z6831 Body mass index (BMI) 31.0-31.9, adult: Secondary | ICD-10-CM | POA: Insufficient documentation

## 2023-09-05 DIAGNOSIS — M5030 Other cervical disc degeneration, unspecified cervical region: Secondary | ICD-10-CM | POA: Insufficient documentation

## 2023-09-05 DIAGNOSIS — M170 Bilateral primary osteoarthritis of knee: Secondary | ICD-10-CM | POA: Insufficient documentation

## 2023-09-05 DIAGNOSIS — M47812 Spondylosis without myelopathy or radiculopathy, cervical region: Secondary | ICD-10-CM | POA: Insufficient documentation

## 2023-09-05 DIAGNOSIS — S8001XA Contusion of right knee, initial encounter: Secondary | ICD-10-CM | POA: Insufficient documentation

## 2023-09-05 DIAGNOSIS — Z23 Encounter for immunization: Secondary | ICD-10-CM | POA: Insufficient documentation

## 2023-09-05 DIAGNOSIS — E663 Overweight: Secondary | ICD-10-CM | POA: Insufficient documentation

## 2023-09-05 DIAGNOSIS — G8929 Other chronic pain: Secondary | ICD-10-CM | POA: Insufficient documentation

## 2023-09-05 DIAGNOSIS — S50311A Abrasion of right elbow, initial encounter: Secondary | ICD-10-CM | POA: Insufficient documentation

## 2023-09-05 DIAGNOSIS — W010XXA Fall on same level from slipping, tripping and stumbling without subsequent striking against object, initial encounter: Secondary | ICD-10-CM | POA: Insufficient documentation

## 2023-09-05 DIAGNOSIS — Y92481 Parking lot as the place of occurrence of the external cause: Secondary | ICD-10-CM

## 2023-09-05 DIAGNOSIS — S8002XA Contusion of left knee, initial encounter: Secondary | ICD-10-CM | POA: Insufficient documentation

## 2023-09-05 DIAGNOSIS — M25551 Pain in right hip: Secondary | ICD-10-CM | POA: Insufficient documentation

## 2023-09-05 DIAGNOSIS — S0990XA Unspecified injury of head, initial encounter: Secondary | ICD-10-CM | POA: Insufficient documentation

## 2023-09-05 MED ORDER — ACETAMINOPHEN 325 MG TABLET
975.0000 mg | ORAL_TABLET | ORAL | Status: AC
Start: 2023-09-05 — End: 2023-09-05
  Administered 2023-09-05: 975 mg via ORAL
  Filled 2023-09-05: qty 3

## 2023-09-05 MED ORDER — DIPHTH,PERTUSSIS(ACEL),TETANUS 2.5 LF UNIT-8 MCG-5 LF/0.5ML IM SYRINGE
0.5000 mL | INJECTION | INTRAMUSCULAR | Status: AC
Start: 2023-09-05 — End: 2023-09-05
  Administered 2023-09-05: 0.5 mL via INTRAMUSCULAR
  Filled 2023-09-05: qty 0.5

## 2023-09-05 MED ORDER — NEOMYCIN-BACITRACN ZN-POLYMYXN 3.5 MG-400 UNIT-5,000 UNIT TOP OINT PKT
1.0000 | TOPICAL_OINTMENT | CUTANEOUS | Status: AC
Start: 2023-09-05 — End: 2023-09-05
  Administered 2023-09-05: 1 via TOPICAL
  Filled 2023-09-05: qty 1

## 2023-09-05 NOTE — ED Nurses Note (Signed)
Rounded on patient.  Reviewed vital signs and treatment plan.  Asked if patient had any needs, especially in the area of toileting, pain management and general comfort.  Addressed issues.  Asked the patient/family if they had any needs before I left the room.  Indicated that I would be back within the hour to evaluate them again and update them on progress.  Call bell within reach.

## 2023-09-05 NOTE — ED Triage Notes (Signed)
Brought in by [facility] after falling in the parking lot at [employer] in [place], states she tried to step over a parking block, lost her balance and fell backwards landing on her back, c/o pain in lower back and lt knee, states she has chronic pain in these areas but the fall has made it worse, also hit back of her head, denies loss of consciousness

## 2023-09-05 NOTE — Discharge Instructions (Signed)
Follow instruction sheets.  Take Tylenol as directed for pain.  Apply ice to the affected areas of pain off and on for the next few days.  Follow-up with your primary care provider Monia Pouch next week for recheck.  Call their office for an appointment time.  Return if any signs of head injury, infection or worsening condition.

## 2023-09-05 NOTE — ED Provider Notes (Signed)
Elizabeth Page  April 08, 1946  77 y.o.  female    Chief Complaint:   Chief Complaint   Patient presents with    Fall    Back Pain        HPI: This is a 77 y.o. female who presents to the emergency department via EMS after falling in the parking lot at [place] he had [place] she states she stepped over a parking block and lost her balance and fell backwards landing on her back.  She is complaining of pain in her low back and left knee.  She states she does have chronic pain in these areas with a fall seemed to make it worse.  She also hit the back of her head.  Denies loss of consciousness.  She is on no anticoagulants.  She does have chronic neck and back pain but states it is worse.  She localized the back pain more in the lumbar region.  She states she has had a prior cervical fracture in the past.  She was also complaining of bilateral knee pain and right hip pain.  She had seen a chiropractor for her knee and back and did some manipulation on it and seemed to make it worse few weeks ago.  ?   Past Medical History:   Past Medical History:   Diagnosis Date    Anxiety     Depression     Diabetes mellitus, type 2 (CMS HCC)     GERD (gastroesophageal reflux disease)     History of concussion     HTN (hypertension)     Hyperlipidemia     Stress     Ulcerative colitis (CMS HCC)       Past Surgical History:   Past Surgical History:   Procedure Laterality Date    Hx cesarean section      Hx hysterectomy      Orif ankle fracture      Total abdominal hysterectomy        Social History:   Social History     Tobacco Use    Smoking status: Never    Smokeless tobacco: Never   Substance Use Topics    Alcohol use: No    Drug use: Never      Social History     Substance and Sexual Activity   Drug Use Never      Allergies:   Allergies   Allergen Reactions    Allergy-Decongestant [Dexbrompheniramine-Pseudoephed]     Codeine     Cortisone     Motrin [Ibuprofen]       ?     Review of Systems: Other than pertinent positives  discussed in HPI, all other systems reviewed and are negative.  ?   Physical Exam:     BP (!) 146/72   Pulse 80   Temp 36.5 C (97.7 F)   Resp 16   Ht 1.6 m (5\' 3" )   Wt 81.2 kg (179 lb)   SpO2 98%   BMI 31.71 kg/m        General: Patient alert and oriented.  Appears to be in no acute distress.  She is overweight.  HEENT: Head: Normocephalic, atraumatic. Eyes:  Pupils equal and reactive bilaterally.  Extraocular muscles intact in all directions.  Normal conjunctiva. Oral mucosa moist.  Sclera is clear.  No outward signs of trauma to the head or face.  Respiratory: No respiratory distress noted.  Lungs are clear and equal bilaterally.  No wheezing, rales,  or rhonchi.  She is not tachypneic.  Chest is nontender to palpation  Heart:  Heart is regular rate and without murmur.  Strong peripheral pulses present distally bilaterally.  Abdomen:  Abdomen was soft, nontender.  No rebound guarding or masses noted.  Bowel sounds were present.  Pelvis stable  MS: Appropriate ROM. Normal Strength.  She does have tenderness to palpation of the mid lumbar spine.  No tenderness over the cervical or thoracic spine but she does complain of some neck pain.  Does have some tenderness somewhat diffusely to her knees bilaterally.  No gross effusion.  Neuro: Alert and oriented to person, place, time, and situation. CN II-XII grossly intact.  No focal motor or sensory deficits. GCS 15.  Skin: Warm and dry. No cyanosis.  No rash appreciate.      Radiology:  CT head and C-spine and lumbar spine without contrast and x-ray of the bilateral knees four view were obtained and interpreted by Radiology showing:  CT head w/o contrast:  FINDINGS:  There is no acute intracranial hemorrhage, mass effect, or evidence of large acute infarct.     Brain: Low density in the periventricular white matter suggests mild chronic small vessel ischemic changes.     CSF Spaces: Mild generalized cerebral atrophy      Sinuses/Mastoids:  Clear at visualized  levels      Bones: Unremarkable        IMPRESSION:  NO ACUTE FINDINGS      CT C-spine w/o contrast:  FINDINGS:  Alignment: No traumatic subluxation  Mild anterior listhesis C3-4 due to degenerative changes  Loss the normal lordosis with otherwise normal AP alignment        Vertebrae: No acute fractures  Moderately advanced cervical degenerative disease        Soft Tissues:   Unremarkable        IMPRESSION:  NO ACUTE CERVICAL FRACTURE.  DEGENERATIVE CHANGES.      CT Lumbar spine:  FINDINGS:  Vertebrae:  Normal lumbar vertebral body heights.  No evidence of fracture.  No spondylolysis.     Alignment:  No spondylolisthesis.     Multiple mild loss of disc height. Broad-based posterior disc bulge/disc osteophyte complexes and facet arthropathy at L4-5 and L5-S1. No high-grade spinal canal or bony neural foraminal stenosis.     Sacrum:  Visualized upper sacrum and SI joints are unremarkable.     Moderate atherosclerotic calcification of the abdominal aorta. Visualized retroperitoneal structures are otherwise unremarkable.        IMPRESSION:     NO ACUTE LUMBAR FRACTURE.      X-ray b/l knees 4 view:  FINDINGS:  RIGHT KNEE:  No fracture.  No suspicious bone lesion.  Normal alignment.  Mild degenerative tricompartmental joint space narrowing. Quadriceps enthesopathy.  No effusion.  Soft tissues are unremarkable.        LEFT KNEE:  No fracture.  No suspicious bone lesion.  Normal alignment.  Mild degenerative tricompartmental joint space narrowing. Quadriceps enthesopathy.  No effusion.  Soft tissues are unremarkable.        IMPRESSION:     No acute fracture or dislocation of the bilateral knees. Mild degenerative changes.      Emergency room course and Medical Decision Making: Patient was triaged, vital signs were obtained, patient was placed in a room. Nurses notes reviewed.  H&P was obtained. After examining the patient, she was transferred to Radiology for CT head, C-spine lumbar spine without contrast and bilateral  x-rays  of her knees to rule out injury and fracture.  X-ray of the knees bilaterally showed no acute fracture or dislocation of the bilateral knees.  Mild degenerative changes noted.  CT of the head showed no acute abnormalities.  CT of the C-spine showed no acute fracture.  She had degenerative cervical disc disease.  CT of the lumbar spine showed no acute fracture.  At this point her findings are consistent more with soft tissue contusions to her knee and abrasion to her right elbow.  She was given instructions for head injury.  She is advised to keep her wounds clean and dry and clean daily with soap and water and Neosporin ointment and a new Band-Aid.  She is advised to return if any signs of head injury, infection or worsening condition.  Patient was discharged in stable condition.  She can take Tylenol as directed for pain.  She can apply ice to the affected areas of pain off and on for the next few days.  She can follow up with her primary care provider Monia Pouch next week for recheck.  She can call their office to schedule appointment time.  Patient was discharged in stable condition  ?     This note was partially created using voice recognition software and is inherently subject to errors including those of syntax and "sound alike " substitutions which may escape proof reading.  In such instances, original meaning may be extrapolated by contextual derivation.     ?   Clinical Impression:   Clinical Impression   Accidental fall, initial encounter (Primary)   Abrasion of right elbow, initial encounter   Contusion of left knee, initial encounter   Contusion of right knee, initial encounter   Closed head injury, initial encounter        Disposition: Discharged    Vernie Ammons, DO           Procedures

## 2023-09-05 NOTE — ED Nurses Note (Signed)
Refused C-collar.

## 2023-09-05 NOTE — ED Nurses Note (Signed)

## 2023-09-05 NOTE — ED Nurses Note (Signed)
Obtained cab voucher and called [facility] [employer] for transport to home

## 2023-09-27 ENCOUNTER — Encounter (INDEPENDENT_AMBULATORY_CARE_PROVIDER_SITE_OTHER): Payer: Self-pay | Admitting: PSYCHIATRY

## 2023-09-27 ENCOUNTER — Other Ambulatory Visit: Payer: Self-pay

## 2023-09-27 ENCOUNTER — Ambulatory Visit (INDEPENDENT_AMBULATORY_CARE_PROVIDER_SITE_OTHER): Payer: Medicare PPO | Admitting: PSYCHIATRY

## 2023-09-27 VITALS — BP 126/86 | HR 98 | Resp 18 | Ht 62.5 in | Wt 165.0 lb

## 2023-09-27 DIAGNOSIS — F3341 Major depressive disorder, recurrent, in partial remission: Secondary | ICD-10-CM

## 2023-09-27 DIAGNOSIS — F410 Panic disorder [episodic paroxysmal anxiety] without agoraphobia: Secondary | ICD-10-CM

## 2023-09-27 MED ORDER — DULOXETINE 60 MG CAPSULE,DELAYED RELEASE
60.0000 mg | DELAYED_RELEASE_CAPSULE | Freq: Every day | ORAL | 1 refills | Status: DC
Start: 2023-09-27 — End: 2024-01-03

## 2023-09-27 MED ORDER — CLONAZEPAM 0.5 MG TABLET
0.5000 mg | ORAL_TABLET | Freq: Three times a day (TID) | ORAL | 5 refills | Status: DC | PRN
Start: 2023-09-27 — End: 2023-11-17

## 2023-09-27 NOTE — Progress Notes (Signed)
BEHAVIORAL MEDICINE, [facility]  [address]  [place] [address]       Name: Elizabeth Page MRN:  [id number]   Date: 09/27/2023 Age: 77 y.o.       History:        Last visit:  08/05/2023    Interval History:  Is in [place] now [place] in her new apartment and she likes it there-- is across from [employer].  Granddaughter is age 51 in [place] and is well. Regrets she does not have a picture since the child was age 19.  But reportedly daughter-in-law does not want the patient down there in [place] and the patient accepts that.    Larey Seat recently and injured right shoulder and ankle and has chronic LBP.  Said she needs further follow-up and I told her to see her PCP for the proper referrals.    Sleep has improved and crisis is over regarding lodging and so she went off of the Remeron and is doing fine on other current meds.  Mood is good as his energy and self-worth.  Denies side effects of medications.    Panic attacks are still a problem but they are well-controlled with Klonopin 0.5 mg t.i.d..  She denies balance issues or sedation issues on the medication.  I supported her today for her progress.      Substance use: Denies any substance use.    Review of systems:   Constitutional: Denies any medication side effect.  GI: Denies any diarrhea, constipation.  Dermatological:  Denies any skin rash    Medical and Surgical history in the interim: Non-contributory.  Past Medical History:   Diagnosis Date    Anxiety     Depression     Diabetes mellitus, type 2 (CMS HCC)     GERD (gastroesophageal reflux disease)     History of concussion     HTN (hypertension)     Hyperlipidemia     Stress     Ulcerative colitis (CMS HCC)        Past Surgical History:   Procedure Laterality Date    HX CESAREAN SECTION      HX HYSTERECTOMY      ORIF ANKLE FRACTURE      TOTAL ABDOMINAL HYSTERECTOMY         Current Outpatient Medications   Medication Sig Dispense Refill    ACCU-CHEK AVIVA CONTROL  SOLN Solution       ACCU-CHEK SOFTCLIX LANCETS Misc       Amlodipine-Olmesartan 5-20 mg Oral Tablet TAKE 1 TABLET BY MOUTH DAILY 90 Tablet 0    clonazePAM (KLONOPIN) 0.5 mg Oral Tablet Take 1 Tablet (0.5 mg total) by mouth Three times a day as needed Indications: panic disorder 90 Tablet 5    DULoxetine (CYMBALTA DR) 60 mg Oral Capsule, Delayed Release(E.C.) Take 1 Capsule (60 mg total) by mouth Once a day Indications: major depressive disorder 90 Capsule 1    levothyroxine (SYNTHROID) 50 mcg Oral Tablet TAKE 1 TABLET BY MOUTH IN THE MORNING 90 Tablet 0    metformin (GLUCOPHAGE-XR) 750 mg Oral Tablet Sustained Release 24 hr Take 1 tablet by mouth twice daily 180 Tablet 0    naproxen Sodium (ALEVE) 220 mg Oral Tablet Take 1 Tablet (220 mg total) by mouth Twice daily with food      omeprazole (PRILOSEC) 40 mg Oral Capsule, Delayed Release(E.C.) Take 1 Capsule (40 mg total) by mouth Once a day 90 Capsule 4  No current facility-administered medications for this visit.        Allergies:   Allergies   Allergen Reactions    Allergy-Decongestant [Dexbrompheniramine-Pseudoephed]     Codeine     Cortisone     Motrin [Ibuprofen]        Exam:      BP 126/86   Pulse 98   Resp 18   Ht 1.588 m (5' 2.5")   Wt 74.8 kg (165 lb)   SpO2 96%   BMI 29.70 kg/m       General appearance: Well dressed, well groomed.  Cooperative, good eye contact.  Musculoskeletal: Normal gait and station. Normal muscle strength and tone.  No abnormal movements.  Speech: Articulate, appropriate with spontaneous elaborations.  Normal rate and volume.  Affect: Congruent with mood which is described as "okay I guess".  Fair range to affect.  Thought process: Logical, linear and goal directed.  Association: Intact  Abnormal thought: None/denies hopelessness, death wishes or suicidal ideations.  Denies violent thoughts or psychotic symptoms.  Judgement and Insight:  Both are somewhat limited.  Attention and concentration: Adequate  Language: Fluent use  of English.    Assessment: Diagnosis: unchanged.   Psychiatrically and psychologically doing reasonably well.    Substance use is not a concern. No legal issues. No acute suicide risk. Discussed status and reviewed options for therapy. Discussed medications, psychotherapy and structures/schedule of program. Reviewed plan, goals, rationale, risks, benefits, and alternatives. Patient indicates understanding and agreement. Has decision making capacity.        ICD-10-CM    1. Recurrent major depressive disorder, in partial remission (CMS HCC)  F33.41       2. Panic disorder  F41.0 clonazePAM (KLONOPIN) 0.5 mg Oral Tablet           Plan:   Medications:  Officially discontinue Remeron 15 mg at HS.  Continue other meds which include Klonopin 0.5 mg p.o. t.i.d. and Cymbalta 60 mg p.o. q.a.m.   Therapy:  Continue current supportive work.  RTC:  4 months/call p.r.n.  Labs/test/imagining: None psychiatrically indicated.  I spent approximately 20 minutes on this case today with the patient present over 66% of that time and we discussed her treatment plan, issues, medications, potential medication adverse effects and I obtained informed consent.  Over 50% of her time was spent on counseling, supportive psychotherapy and patient Education.    Latina [name], MD      This note was partially created using voice recognition software and is inherently subject to errors including those of syntax and "sound-alike" substitutions which may escape proofreading.  In such instances, original meaning may be extrapolated by contextual derivation.

## 2023-09-27 NOTE — Nursing Note (Signed)
09/27/23 1429   [place] Suicide Severity Rating Scale (Since Last Contact Screener)   1. Wish to be Dead (Since Last Contact) N   2. Non-Specific Active Suicidal Thoughts (Since Last Contact) N   6. Suicidal Behavior (Since Last Contact) N   Calculated C-SSRS Risk Score (Since Last Contact) No Risk Indicated

## 2023-09-27 NOTE — Nursing Note (Signed)
09/27/23 1428   PHQ 9 (follow up)   Little interest or pleasure in doing things. 3   Feeling down, depressed, or hopeless 3   PHQ 2 Total 6   Trouble falling or staying asleep, or sleeping too much. 2   Feeling tired or having little energy 2   Poor appetite or overeating 0   Feeling bad about yourself/ that you are a failure in the past 2 weeks? 0   Trouble concentrating on things in the past 2 weeks? 0   Moving/Speaking slowly or being fidgety or restless  in the past 2 weeks? 3   Thoughts that you would be better off DEAD, or of hurting yourself in some way. 0   If you checked off any problems, how difficult have these problems made it for you to do your work, take care of things at home, or get along with other people? Extremely difficult   PHQ 9 Total 13   Interpretation of Total Score Moderate depression

## 2023-10-03 ENCOUNTER — Other Ambulatory Visit (HOSPITAL_COMMUNITY): Payer: Self-pay | Admitting: PHYSICIAN ASSISTANT

## 2023-10-03 DIAGNOSIS — E119 Type 2 diabetes mellitus without complications: Secondary | ICD-10-CM

## 2023-11-02 ENCOUNTER — Other Ambulatory Visit (HOSPITAL_BASED_OUTPATIENT_CLINIC_OR_DEPARTMENT_OTHER): Payer: Self-pay | Admitting: PHYSICIAN ASSISTANT

## 2023-11-02 DIAGNOSIS — E119 Type 2 diabetes mellitus without complications: Secondary | ICD-10-CM

## 2023-11-03 MED ORDER — LEVOTHYROXINE 50 MCG TABLET
50.0000 ug | ORAL_TABLET | Freq: Every morning | ORAL | 0 refills | Status: DC
Start: 2023-11-03 — End: 2023-12-07

## 2023-11-03 MED ORDER — METFORMIN ER 750 MG TABLET,EXTENDED RELEASE 24 HR
750.0000 mg | ORAL_TABLET | Freq: Two times a day (BID) | ORAL | 0 refills | Status: DC
Start: 2023-11-03 — End: 2023-12-01

## 2023-11-11 ENCOUNTER — Telehealth (HOSPITAL_BASED_OUTPATIENT_CLINIC_OR_DEPARTMENT_OTHER): Payer: Self-pay | Admitting: PHYSICIAN ASSISTANT

## 2023-11-11 ENCOUNTER — Emergency Department (HOSPITAL_COMMUNITY): Payer: Self-pay

## 2023-11-11 ENCOUNTER — Other Ambulatory Visit: Payer: Self-pay

## 2023-11-11 ENCOUNTER — Emergency Department
Admission: EM | Admit: 2023-11-11 | Discharge: 2023-11-11 | Disposition: A | Discharge status: Home / Self Care | Payer: Medicare PPO | Attending: Physician Assistant | Admitting: Physician Assistant

## 2023-11-11 ENCOUNTER — Encounter (INDEPENDENT_AMBULATORY_CARE_PROVIDER_SITE_OTHER): Payer: Self-pay | Admitting: PSYCHIATRY

## 2023-11-11 ENCOUNTER — Encounter (HOSPITAL_COMMUNITY): Payer: Self-pay

## 2023-11-11 ENCOUNTER — Emergency Department (HOSPITAL_COMMUNITY): Payer: Medicare PPO

## 2023-11-11 DIAGNOSIS — I252 Old myocardial infarction: Secondary | ICD-10-CM

## 2023-11-11 DIAGNOSIS — R42 Dizziness and giddiness: Secondary | ICD-10-CM | POA: Insufficient documentation

## 2023-11-11 DIAGNOSIS — R519 Headache, unspecified: Secondary | ICD-10-CM | POA: Insufficient documentation

## 2023-11-11 DIAGNOSIS — N390 Urinary tract infection, site not specified: Secondary | ICD-10-CM | POA: Insufficient documentation

## 2023-11-11 DIAGNOSIS — R9431 Abnormal electrocardiogram [ECG] [EKG]: Secondary | ICD-10-CM | POA: Insufficient documentation

## 2023-11-11 DIAGNOSIS — M542 Cervicalgia: Secondary | ICD-10-CM | POA: Insufficient documentation

## 2023-11-11 DIAGNOSIS — M25552 Pain in left hip: Secondary | ICD-10-CM | POA: Insufficient documentation

## 2023-11-11 DIAGNOSIS — W19XXXA Unspecified fall, initial encounter: Secondary | ICD-10-CM

## 2023-11-11 DIAGNOSIS — M25551 Pain in right hip: Secondary | ICD-10-CM | POA: Insufficient documentation

## 2023-11-11 DIAGNOSIS — M47812 Spondylosis without myelopathy or radiculopathy, cervical region: Secondary | ICD-10-CM | POA: Insufficient documentation

## 2023-11-11 DIAGNOSIS — R296 Repeated falls: Secondary | ICD-10-CM | POA: Insufficient documentation

## 2023-11-11 LAB — URINALYSIS, MACROSCOPIC
BILIRUBIN: NEGATIVE mg/dL
BLOOD: NEGATIVE mg/dL
GLUCOSE: NEGATIVE mg/dL
KETONES: NEGATIVE mg/dL
NITRITE: NEGATIVE
PH: 6 (ref 5.0–8.0)
PROTEIN: 10 mg/dL
SPECIFIC GRAVITY: 1.012 (ref 1.002–1.030)
UROBILINOGEN: <2 mg/dL (ref <2.0)

## 2023-11-11 LAB — ECG 12-LEAD
Atrial Rate: 91 {beats}/min
Calculated P Axis: 15 degrees
Calculated R Axis: -31 degrees
Calculated T Axis: 34 degrees
PR Interval: 160 ms
QRS Duration: 74 ms
QT Interval: 348 ms
QTC Calculation: 428 ms
Ventricular rate: 91 {beats}/min

## 2023-11-11 LAB — URINALYSIS, MICROSCOPIC

## 2023-11-11 LAB — CBC WITH DIFF
BASOPHIL #: <0.1 10*3/uL (ref <=0.20)
BASOPHIL %: 0.7 %
EOSINOPHIL #: <0.1 10*3/uL (ref <=0.50)
EOSINOPHIL %: 0.8 %
HCT: 38.6 % (ref 34.8–46.0)
HGB: 12 g/dL (ref 11.5–16.0)
IMMATURE GRANULOCYTE #: <0.1 10*3/uL (ref <0.10)
IMMATURE GRANULOCYTE %: 0.5 % (ref 0.0–1.0)
LYMPHOCYTE #: 1.25 10*3/uL (ref 1.00–4.80)
LYMPHOCYTE %: 16.8 %
MCH: 24.5 pg — ABNORMAL LOW (ref 26.0–32.0)
MCHC: 31.1 g/dL (ref 31.0–35.5)
MCV: 78.9 fL (ref 78.0–100.0)
MONOCYTE #: 0.58 10*3/uL (ref 0.20–1.10)
MONOCYTE %: 7.8 %
MPV: 9.1 fL (ref 8.7–12.5)
NEUTROPHIL #: 5.47 10*3/uL (ref 1.50–7.70)
NEUTROPHIL %: 73.4 %
PLATELETS: 354 10*3/uL (ref 150–400)
RBC: 4.89 10*6/uL (ref 3.85–5.22)
RDW-CV: 14.6 % (ref 11.5–15.5)
WBC: 7.5 10*3/uL (ref 3.7–11.0)

## 2023-11-11 LAB — COMPREHENSIVE METABOLIC PANEL, NON-FASTING
ALBUMIN: 3.7 g/dL (ref 3.4–4.8)
ALKALINE PHOSPHATASE: 79 U/L (ref 55–145)
ALT (SGPT): 25 U/L (ref <31)
ANION GAP: 12 mmol/L (ref 4–13)
AST (SGOT): 40 U/L — ABNORMAL HIGH (ref 11–34)
BILIRUBIN TOTAL: 0.6 mg/dL (ref 0.3–1.3)
BUN/CREA RATIO: 7 (ref 6–22)
BUN: 6 mg/dL — ABNORMAL LOW (ref 8–25)
CALCIUM: 9.5 mg/dL (ref 8.6–10.3)
CHLORIDE: 97 mmol/L (ref 96–111)
CO2 TOTAL: 22 mmol/L — ABNORMAL LOW (ref 23–31)
CREATININE: 0.87 mg/dL (ref 0.60–1.05)
ESTIMATED GFR - FEMALE: 69 mL/min/BSA (ref >=60)
GLUCOSE: 180 mg/dL — ABNORMAL HIGH (ref 65–125)
POTASSIUM: 3.7 mmol/L (ref 3.5–5.1)
PROTEIN TOTAL: 8.2 g/dL — ABNORMAL HIGH (ref 6.0–8.0)
SODIUM: 131 mmol/L — ABNORMAL LOW (ref 136–145)

## 2023-11-11 LAB — TROPONIN-I: TROPONIN-I HS: <2.7 ng/L (ref <=14.0)

## 2023-11-11 MED ORDER — CEFDINIR 300 MG CAPSULE
300.0000 mg | ORAL_CAPSULE | Freq: Two times a day (BID) | ORAL | 0 refills | Status: AC
Start: 2023-11-11 — End: 2023-11-12

## 2023-11-11 MED ORDER — CEPHALEXIN 500 MG CAPSULE
500.0000 mg | ORAL_CAPSULE | ORAL | Status: AC
Start: 2023-11-11 — End: 2023-11-11
  Administered 2023-11-11: 500 mg via ORAL
  Filled 2023-11-11: qty 1

## 2023-11-11 NOTE — ED Provider Notes (Signed)
Emergency Department  Provider Note    Name: Elizabeth Page      Subjective  History of Present Illness    Elizabeth Page is a 78 y.o. female  who presents to the ED today for multiple complaints  Patient states she has had multiple falls recently with the most recent being 1-2 weeks ago.  States that she has been generally weak.  She fell directly down onto her bottom and endorses pain in both hips since then, worse in the left hip.  She denies any head injury, loss of consciousness, vomiting.  She denies any pain in her back.  She does not take anticoagulants.  However since the fall she has had worsening pain in her head and neck as well as some dizziness.  She also complains of some pain in bilateral shoulders.            Historical Data   Below information reviewed with patient where pertinent:  Past Medical History:   Diagnosis Date    Anxiety     Depression     Diabetes mellitus, type 2 (CMS HCC)     GERD (gastroesophageal reflux disease)     History of concussion     HTN (hypertension)     Hyperlipidemia     Stress     Ulcerative colitis (CMS HCC)        Current Outpatient Medications   Medication Sig    ACCU-CHEK AVIVA CONTROL SOLN Solution     ACCU-CHEK SOFTCLIX LANCETS Misc     Amlodipine-Olmesartan 5-20 mg Oral Tablet TAKE 1 TABLET BY MOUTH DAILY    cefdinir (OMNICEF) 300 mg Oral Capsule Take 1 Capsule (300 mg total) by mouth Twice daily for 1 day    clonazePAM (KLONOPIN) 0.5 mg Oral Tablet Take 1 Tablet (0.5 mg total) by mouth Three times a day as needed Indications: panic disorder    DULoxetine (CYMBALTA DR) 60 mg Oral Capsule, Delayed Release(E.C.) Take 1 Capsule (60 mg total) by mouth Once a day Indications: major depressive disorder    levothyroxine (SYNTHROID) 50 mcg Oral Tablet Take 1 Tablet (50 mcg total) by mouth Every morning    metformin (GLUCOPHAGE-XR) 750 mg Oral Tablet Sustained Release 24 hr Take 1 Tablet (750 mg total) by mouth Twice daily    naproxen Sodium (ALEVE) 220 mg Oral Tablet  Take 1 Tablet (220 mg total) by mouth Twice daily with food    omeprazole (PRILOSEC) 40 mg Oral Capsule, Delayed Release(E.C.) Take 1 Capsule (40 mg total) by mouth Once a day       Allergies   Allergen Reactions    Allergy-Decongestant [Dexbrompheniramine-Pseudoephed]     Codeine     Cortisone     Motrin [Ibuprofen]        Past Surgical History:   Procedure Laterality Date    HX CESAREAN SECTION      HX HYSTERECTOMY      ORIF ANKLE FRACTURE      TOTAL ABDOMINAL HYSTERECTOMY         Social History     Tobacco Use    Smoking status: Never    Smokeless tobacco: Never   Substance Use Topics    Alcohol use: No    Drug use: Never              Objective  Physical Exam   Filed Vitals:    11/11/23 1420 11/11/23 1630 11/11/23 1845   BP: 117/75 136/73 139/71   Pulse: 100  78 74   Resp: 16 15 17    Temp: 36.6 C (97.8 F)     SpO2: 98% 98%        Physical Exam  Vital signs Reviewed.   Constitutional: NAD.   HENT:   Head: Normocephalic and atraumatic   Mouth/Throat: Oropharynx is clear and moist   Eyes: EOMI, conjunctivae without discharge bilaterally  Neck: Trachea midline   Cardiovascular: RRR, No murmurs, rubs or gallops   Pulmonary/Chest: BS equal bilaterally, good air movement. No respiratory distress. No wheezes, rales or chest tenderness   Abdominal: Abdomen soft, no tenderness, rebound or guarding  Musculoskeletal:  Nontender midline cervical, thoracic, lumbar spine.  Bilateral DP and PT pulses 2+.  Skin: warm and dry. No rash, erythema, pallor or cyanosis  Psychiatric: normal mood and affect. Behavior is normal   Neurological: Alert&Ox3. Grossly intact         Orders and Results  Patient Data    Labs Ordered/Reviewed   COMPREHENSIVE METABOLIC PANEL, NON-FASTING - Abnormal; Notable for the following components:       Result Value    SODIUM 131 (*)     CO2 TOTAL 22 (*)     BUN 6 (*)     GLUCOSE 180 (*)     AST (SGOT)  40 (*)     PROTEIN TOTAL 8.2 (*)     All other components within normal limits   CBC WITH DIFF - Abnormal;  Notable for the following components:    MCH 24.5 (*)     All other components within normal limits   URINALYSIS, MACROSCOPIC - Abnormal; Notable for the following components:    APPEARANCE Hazy (*)     LEUKOCYTES Large (*)     All other components within normal limits   URINALYSIS, MICROSCOPIC - Abnormal; Notable for the following components:    RBCS 3-5 (*)     WBCS 75-100 (*)     SQUAMOUS EPITHELIAL 5-10 (*)     All other components within normal limits   TROPONIN-I - Normal   URINE CULTURE,ROUTINE   CBC/DIFF    Narrative:     The following orders were created for panel order CBC/DIFF.                  Procedure                               Abnormality         Status                                     ---------                               -----------         ------                                     CBC WITH QION[[id number]]                Abnormal            Final result  Please view results for these tests on the individual orders.   URINALYSIS, MACROSCOPIC AND MICROSCOPIC W/CULTURE REFLEX    Narrative:     The following orders were created for panel order URINALYSIS, MACROSCOPIC AND MICROSCOPIC W/CULTURE REFLEX.                  Procedure                               Abnormality         Status                                     ---------                               -----------         ------                                     URINALYSIS, MACROSCOPIC[[id number]]      Abnormal            Final result                               URINALYSIS, MICROSCOPIC[[id number]]      Abnormal            Final result                                                 Please view results for these tests on the individual orders.       Imaging:  XR HIPS BILATERAL W PELVIS 2 VIEWS   Final Result by Edi, Radresults In (01/24 1626)   NO ACUTE FRACTURE OR DISLOCATION.       If acute hip fracture is suspected after a fall or minor trauma and initial radiographs are negative then MRI  of the pelvis and affected hip without IV contrast or CT of the pelvis and hips without IV contrast is usually appropriate as the next imaging study. (ACR Appropriateness Criteria: Acute Hip Pain-Suspected Fracture, 2018)                Radiologist location ID: [id number]         CT CERVICAL SPINE WO IV CONTRAST   Final Result by Edi, Radresults In (01/24 1604)   NO ACUTE CERVICAL FRACTURE.  DEGENERATIVE CHANGES.         One or more dose reduction techniques were used (e.g., Automated exposure control, adjustment of the mA and/or kV according to patient size, use of iterative reconstruction technique).         Radiologist location ID: [id number]         CT BRAIN WO IV CONTRAST   Final Result by Edi, Radresults In (01/24 1601)   CHRONIC CHANGES.  NO ACUTE FINDINGS.          One or more dose reduction techniques were used (e.g., Automated exposure control, adjustment of the mA and/or kV according to patient size, use of iterative reconstruction technique).  Radiologist location ID: [id number]              Nursing notes, labs, and imaging reviewed.    Orders:  Orders Placed This Encounter    URINE CULTURE,ROUTINE    XR HIPS BILATERAL W PELVIS 2 VIEWS    CT BRAIN WO IV CONTRAST    CT CERVICAL SPINE WO IV CONTRAST    CBC/DIFF    COMPREHENSIVE METABOLIC PANEL, NON-FASTING    TROPONIN-I    CBC WITH DIFF    URINALYSIS, MACROSCOPIC AND MICROSCOPIC W/CULTURE REFLEX    URINALYSIS, MACROSCOPIC    URINALYSIS, MICROSCOPIC    ECG 12-LEAD    CANCELED: INSERT & MAINTAIN PERIPHERAL IV ACCESS    cephalexin (KEFLEX) capsule    cefdinir (OMNICEF) 300 mg Oral Capsule           Medical Decision Making   Medical Decision Making  In brief, Elizabeth Page is a 78 y.o. female who presented to the ED for bilateral hip pain, head and neck pain, dizziness.       Nontoxic appearing vital signs stable.  EKG shows normal sinus rhythm with a heart rate of 91.   No acute ischemic changes.  CBC, CMP, troponin grossly unremarkable.   Urinalysis with large leukocytes with 75-100 wbc's, negative bacteria.  X-ray bilateral hips nonacute.    CT brain and cervical spine nonacute.    Will treat for urinary tract infection with cefdinir.  Workup otherwise reassuring.      Medical Decision Making  Amount and/or Complexity of Data Reviewed  Labs: ordered. Decision-making details documented in ED Course.  Radiology: ordered. Decision-making details documented in ED Course.  ECG/medicine tests: ordered and independent interpretation performed. Decision-making details documented in ED Course.    Risk  Prescription drug management.          Clinical Impression:     Clinical Impression   Urinary tract infection (Primary)   Dizziness   Headache   Neck pain   Hip pain, bilateral       Disposition: Discharged    Follow up:   No follow-up provider specified.             History reviewed in chart.  Parts of this patient's chart were completed in a retrospective fashion due to simultaneous direct patient care activities in the Emergency Department.

## 2023-11-11 NOTE — ED Nurses Note (Signed)
Called IC cab for patient, will be here for patient.

## 2023-11-11 NOTE — ED Nurses Note (Signed)
Discharge information reviewed. Pt awaiting cab in room.

## 2023-11-11 NOTE — ED Triage Notes (Signed)
Pt presents with c/o dizziness x 3 weeks. Pt states she had a PCP appt today and called to cancel d/t her dizziness and they told her to go to ED. Pt also c/o dry mouth, arm pain, back pain, and issues with her cataracts. A&O x4.

## 2023-11-11 NOTE — ED Nurses Note (Signed)
Called Nurse Supervisor Museum/gallery curator for [employer] to Transport Patient home

## 2023-11-11 NOTE — ED Nurses Note (Signed)
Pt up to RR

## 2023-11-11 NOTE — Telephone Encounter (Signed)
Message left for [employer] at [employer] regarding starting [employer].

## 2023-11-16 LAB — URINE CULTURE,ROUTINE: URINE CULTURE: 50000 — AB

## 2023-11-17 ENCOUNTER — Other Ambulatory Visit (HOSPITAL_BASED_OUTPATIENT_CLINIC_OR_DEPARTMENT_OTHER): Payer: Self-pay | Admitting: PHYSICIAN ASSISTANT

## 2023-11-17 DIAGNOSIS — F410 Panic disorder [episodic paroxysmal anxiety] without agoraphobia: Secondary | ICD-10-CM

## 2023-11-17 MED ORDER — CLONAZEPAM 0.5 MG TABLET
0.5000 mg | ORAL_TABLET | Freq: Three times a day (TID) | ORAL | 5 refills | Status: DC | PRN
Start: 2023-11-17 — End: 2024-01-18

## 2023-11-17 MED ORDER — AMLODIPINE 5 MG-OLMESARTAN 20 MG TABLET
1.0000 | ORAL_TABLET | Freq: Every day | ORAL | 0 refills | Status: DC
Start: 2023-11-17 — End: 2024-02-17

## 2023-12-01 ENCOUNTER — Other Ambulatory Visit (HOSPITAL_BASED_OUTPATIENT_CLINIC_OR_DEPARTMENT_OTHER): Payer: Self-pay | Admitting: PHYSICIAN ASSISTANT

## 2023-12-01 DIAGNOSIS — E119 Type 2 diabetes mellitus without complications: Secondary | ICD-10-CM

## 2023-12-01 MED ORDER — METFORMIN ER 750 MG TABLET,EXTENDED RELEASE 24 HR
750.0000 mg | ORAL_TABLET | Freq: Two times a day (BID) | ORAL | 0 refills | Status: DC
Start: 2023-12-01 — End: 2024-01-03

## 2023-12-06 ENCOUNTER — Telehealth (HOSPITAL_BASED_OUTPATIENT_CLINIC_OR_DEPARTMENT_OTHER): Payer: Self-pay | Admitting: PHYSICIAN ASSISTANT

## 2023-12-07 ENCOUNTER — Other Ambulatory Visit (HOSPITAL_BASED_OUTPATIENT_CLINIC_OR_DEPARTMENT_OTHER): Payer: Self-pay | Admitting: PHYSICIAN ASSISTANT

## 2023-12-07 MED ORDER — LEVOTHYROXINE 50 MCG TABLET
50.0000 ug | ORAL_TABLET | Freq: Every morning | ORAL | 0 refills | Status: DC
Start: 2023-12-07 — End: 2024-03-14

## 2023-12-12 ENCOUNTER — Ambulatory Visit: Payer: Medicare PPO | Admitting: PHYSICIAN ASSISTANT

## 2024-01-03 ENCOUNTER — Other Ambulatory Visit (HOSPITAL_BASED_OUTPATIENT_CLINIC_OR_DEPARTMENT_OTHER): Payer: Self-pay | Admitting: PHYSICIAN ASSISTANT

## 2024-01-03 DIAGNOSIS — E119 Type 2 diabetes mellitus without complications: Secondary | ICD-10-CM

## 2024-01-03 MED ORDER — DULOXETINE 60 MG CAPSULE,DELAYED RELEASE
60.0000 mg | DELAYED_RELEASE_CAPSULE | Freq: Every day | ORAL | 0 refills | Status: DC
Start: 2024-01-03 — End: 2024-02-09

## 2024-01-03 MED ORDER — METFORMIN ER 750 MG TABLET,EXTENDED RELEASE 24 HR
750.0000 mg | ORAL_TABLET | Freq: Two times a day (BID) | ORAL | 0 refills | Status: DC
Start: 2024-01-03 — End: 2024-02-03

## 2024-01-03 MED ORDER — OMEPRAZOLE 40 MG CAPSULE,DELAYED RELEASE
40.0000 mg | DELAYED_RELEASE_CAPSULE | Freq: Every day | ORAL | 0 refills | Status: DC
Start: 2024-01-03 — End: 2024-04-05

## 2024-01-18 ENCOUNTER — Other Ambulatory Visit (HOSPITAL_BASED_OUTPATIENT_CLINIC_OR_DEPARTMENT_OTHER): Payer: Self-pay | Admitting: PHYSICIAN ASSISTANT

## 2024-01-18 MED ORDER — CLONAZEPAM 0.5 MG TABLET
0.5000 mg | ORAL_TABLET | Freq: Three times a day (TID) | ORAL | 5 refills | Status: DC | PRN
Start: 2024-01-18 — End: 2024-02-09

## 2024-01-26 ENCOUNTER — Encounter (INDEPENDENT_AMBULATORY_CARE_PROVIDER_SITE_OTHER): Payer: Self-pay | Admitting: PSYCHIATRY

## 2024-02-03 ENCOUNTER — Other Ambulatory Visit (HOSPITAL_BASED_OUTPATIENT_CLINIC_OR_DEPARTMENT_OTHER): Payer: Self-pay | Admitting: PHYSICIAN ASSISTANT

## 2024-02-03 DIAGNOSIS — E119 Type 2 diabetes mellitus without complications: Secondary | ICD-10-CM

## 2024-02-04 MED ORDER — METFORMIN ER 750 MG TABLET,EXTENDED RELEASE 24 HR
750.0000 mg | ORAL_TABLET | Freq: Two times a day (BID) | ORAL | Status: DC
Start: 2024-02-04 — End: 2024-03-07

## 2024-02-09 ENCOUNTER — Other Ambulatory Visit: Payer: Self-pay

## 2024-02-09 ENCOUNTER — Ambulatory Visit (INDEPENDENT_AMBULATORY_CARE_PROVIDER_SITE_OTHER): Admitting: PSYCHIATRY

## 2024-02-09 VITALS — BP 105/58 | HR 60 | Resp 18 | Ht 62.5 in

## 2024-02-09 DIAGNOSIS — F3340 Major depressive disorder, recurrent, in remission, unspecified: Secondary | ICD-10-CM

## 2024-02-09 DIAGNOSIS — F410 Panic disorder [episodic paroxysmal anxiety] without agoraphobia: Secondary | ICD-10-CM

## 2024-02-09 MED ORDER — DULOXETINE 60 MG CAPSULE,DELAYED RELEASE
60.0000 mg | DELAYED_RELEASE_CAPSULE | Freq: Every day | ORAL | 1 refills | Status: DC
Start: 2024-02-09 — End: 2024-04-05

## 2024-02-09 MED ORDER — CLONAZEPAM 0.5 MG TABLET
0.5000 mg | ORAL_TABLET | Freq: Three times a day (TID) | ORAL | 5 refills | Status: DC | PRN
Start: 2024-02-09 — End: 2024-06-05

## 2024-02-09 NOTE — Nursing Note (Signed)
 02/09/24 1318   PHQ 9 (follow up)   Little interest or pleasure in doing things. 3   Feeling down, depressed, or hopeless 3   PHQ 2 Total 6   Trouble falling or staying asleep, or sleeping too much. 3   Feeling tired or having little energy 3   Poor appetite or overeating 1   Feeling bad about yourself/ that you are a failure in the past 2 weeks? 1   Trouble concentrating on things in the past 2 weeks? 1   Moving/Speaking slowly or being fidgety or restless  in the past 2 weeks? 1   Thoughts that you would be better off DEAD, or of hurting yourself in some way. 0   If you checked off any problems, how difficult have these problems made it for you to do your work, take care of things at home, or get along with other people? Very difficult   PHQ 9 Total 16   Interpretation of Total Score Moderate/Severe depression

## 2024-02-09 NOTE — Progress Notes (Signed)
 BEHAVIORAL MEDICINE, [facility]  [address]  [place] [address]       Name: SERAS KOONS MRN:  [id number]   Date: 02/09/2024 Age: 78 y.o.       History:        Last visit:  09/27/2023    Interval History:  The patient is still living in [place] [address] in her apartment and does not like it there as the maintenance pesters her she says.  He gives allegedly her keys to His family and they come in at times when she is there and not there and goes through her things. Says she will call the owner soon. Says she will try and get back into the [place]. Has no air conditioning where she is now.    Granddaughter is age 74 and lives in [place]  with the patient's son and the son's wife.  Daughter in-law does not like the patient so she is not allowed to visit. Son calls the patient every week. She does not burden son with her issues.    The patient is limited by low back pain and balance can be an issue at times but she denies sedation on Klonopin .  She feels that it is still necessary very much so for her panic attacks they seemed to be reasonably controlled.  She does not abuse the Klonopin  and does not use alcohol. "My nerves are so bad at that place".    Remains on Cymbalta  60 mg daily.  Seems to be managing her depressive symptoms and chronic pain issues reasonably well. Day to day stress due living situation.      Substance use: Denies any substance use.    Review of systems:   Constitutional: Denies any medication side effect.  GI: Denies any diarrhea, constipation.  Dermatological:  Denies any skin rash    Medical and Surgical history in the interim: Non-contributory.  Past Medical History:   Diagnosis Date    Anxiety     Depression     Diabetes mellitus, type 2 (CMS HCC)     GERD (gastroesophageal reflux disease)     History of concussion     HTN (hypertension)     Hyperlipidemia     Stress     Ulcerative colitis        Past Surgical History:   Procedure Laterality  Date    HX CESAREAN SECTION      HX HYSTERECTOMY      ORIF ANKLE FRACTURE      TOTAL ABDOMINAL HYSTERECTOMY         Current Outpatient Medications   Medication Sig Dispense Refill    ACCU-CHEK AVIVA CONTROL SOLN Solution       ACCU-CHEK SOFTCLIX LANCETS Misc       Amlodipine -Olmesartan  5-20 mg Oral Tablet Take 1 Tablet by mouth Once a day 90 Tablet 0    clonazePAM  (KLONOPIN ) 0.5 mg Oral Tablet Take 1 Tablet (0.5 mg total) by mouth Three times a day as needed Indications: panic disorder 90 Tablet 5    DULoxetine  (CYMBALTA  DR) 60 mg Oral Capsule, Delayed Release(E.C.) Take 1 Capsule (60 mg total) by mouth Daily Indications: major depressive disorder 90 Capsule 0    levothyroxine  (SYNTHROID) 50 mcg Oral Tablet Take 1 Tablet (50 mcg total) by mouth Every morning 90 Tablet 0    metformin  (GLUCOPHAGE -XR) 750 mg Oral Tablet Sustained Release 24 hr Take 1 Tablet (750 mg total) by mouth Twice daily  naproxen Sodium (ALEVE) 220 mg Oral Tablet Take 1 Tablet (220 mg total) by mouth Twice daily with food      omeprazole  (PRILOSEC) 40 mg Oral Capsule, Delayed Release(E.C.) Take 1 Capsule (40 mg total) by mouth Daily 90 Capsule 0     No current facility-administered medications for this visit.        Allergies:   Allergies   Allergen Reactions    Allergy-Decongestant [Dexbrompheniramine-Pseudoephed]     Codeine     Cortisone     Motrin [Ibuprofen]        Exam:      BP (!) 105/58   Pulse 60   Resp 18   Ht 1.588 m (5' 2.5")   SpO2 97%   BMI 27.90 kg/m       General appearance: Well dressed, well groomed.  Cooperative, good eye contact.  Musculoskeletal: Normal gait and station. Normal muscle strength and tone.  Speech: Articulate, appropriate with spontaneous elaborations.  Normal rate and volume.  Affect: Congruent with mood which is described as "stressed out".  Thought process: Logical, linear and goal directed.  Stays on subject well.  Association: Intact  Abnormal thought: None/denies hopelessness, death wishes or  suicidal ideations.  Denies violent thoughts or psychotic symptoms.  Judgement and Insight:  Both are intact.  Attention and concentration: Adequate  Language: Fluent use of English.    Assessment: Diagnosis: unchanged.   Psychologically stressed due to pain/living situation.    Substance use is not a concern. No legal issues. No acute suicide risk. Discussed status and reviewed options for therapy. Discussed medications, psychotherapy and structures/schedule of program. Reviewed plan, goals, rationale, risks, benefits, and alternatives. Patient indicates understanding and agreement. Has decision making capacity.        ICD-10-CM    1. Recurrent major depressive disorder, in remission (CMS HCC)  F33.40       2. Panic disorder  F41.0            Plan: Needs to move again. Says she will go to court to have help with her move to new apartment.  Medications: Continue current.  Therapy:  Continue current supportive work. She vented for much of the session today.  RTC: 4 months.  Labs/test/imagining: None psychiatrically indicated.  I spent approximately 25 minutes on this complicated case today with the patient present over 66% of that time and we, medications, potential medication adverse effects and I obtained informed consent.  Over 50% of her time was spent on counseling, supportive psychotherapy and patient Education.    Hershal Loron, MD      This note was partially created using voice recognition software and is inherently subject to errors including those of syntax and "sound-alike" substitutions which may escape proofreading.  In such instances, original meaning may be extrapolated by contextual derivation.

## 2024-02-09 NOTE — Nursing Note (Signed)
 02/09/24 1319   [place] Suicide Severity Rating Scale (Since Last Contact Screener)   1. Wish to be Dead (Since Last Contact) N   2. Non-Specific Active Suicidal Thoughts (Since Last Contact) N   6. Suicidal Behavior (Since Last Contact) N   Calculated C-SSRS Risk Score (Since Last Contact) No Risk Indicated

## 2024-02-17 ENCOUNTER — Other Ambulatory Visit (HOSPITAL_BASED_OUTPATIENT_CLINIC_OR_DEPARTMENT_OTHER): Payer: Self-pay | Admitting: PHYSICIAN ASSISTANT

## 2024-02-18 MED ORDER — AMLODIPINE 5 MG-OLMESARTAN 20 MG TABLET
1.0000 | ORAL_TABLET | Freq: Every day | ORAL | 0 refills | Status: DC
Start: 2024-02-18 — End: 2024-05-21

## 2024-03-02 ENCOUNTER — Telehealth (HOSPITAL_BASED_OUTPATIENT_CLINIC_OR_DEPARTMENT_OTHER): Payer: Self-pay | Admitting: PHYSICIAN ASSISTANT

## 2024-03-07 ENCOUNTER — Other Ambulatory Visit (HOSPITAL_BASED_OUTPATIENT_CLINIC_OR_DEPARTMENT_OTHER): Payer: Self-pay | Admitting: PHYSICIAN ASSISTANT

## 2024-03-07 DIAGNOSIS — E119 Type 2 diabetes mellitus without complications: Secondary | ICD-10-CM

## 2024-03-07 MED ORDER — METFORMIN ER 750 MG TABLET,EXTENDED RELEASE 24 HR
750.0000 mg | ORAL_TABLET | Freq: Two times a day (BID) | ORAL | 1 refills | Status: DC
Start: 2024-03-07 — End: 2024-06-06

## 2024-03-14 ENCOUNTER — Other Ambulatory Visit (HOSPITAL_BASED_OUTPATIENT_CLINIC_OR_DEPARTMENT_OTHER): Payer: Self-pay | Admitting: PHYSICIAN ASSISTANT

## 2024-03-14 MED ORDER — LEVOTHYROXINE 50 MCG TABLET
50.0000 ug | ORAL_TABLET | Freq: Every morning | ORAL | 0 refills | Status: DC
Start: 2024-03-14 — End: 2024-04-13

## 2024-03-16 ENCOUNTER — Other Ambulatory Visit: Payer: Self-pay

## 2024-03-16 ENCOUNTER — Ambulatory Visit: Payer: Self-pay | Attending: PHYSICIAN ASSISTANT | Admitting: PHYSICIAN ASSISTANT

## 2024-03-16 ENCOUNTER — Encounter (HOSPITAL_BASED_OUTPATIENT_CLINIC_OR_DEPARTMENT_OTHER): Payer: Self-pay | Admitting: PHYSICIAN ASSISTANT

## 2024-03-16 ENCOUNTER — Ambulatory Visit (HOSPITAL_BASED_OUTPATIENT_CLINIC_OR_DEPARTMENT_OTHER)

## 2024-03-16 VITALS — BP 102/58 | HR 104 | Ht 62.5 in | Wt 141.6 lb

## 2024-03-16 DIAGNOSIS — I10 Essential (primary) hypertension: Secondary | ICD-10-CM | POA: Insufficient documentation

## 2024-03-16 DIAGNOSIS — Z0000 Encounter for general adult medical examination without abnormal findings: Secondary | ICD-10-CM | POA: Insufficient documentation

## 2024-03-16 DIAGNOSIS — E1165 Type 2 diabetes mellitus with hyperglycemia: Secondary | ICD-10-CM | POA: Insufficient documentation

## 2024-03-16 DIAGNOSIS — E119 Type 2 diabetes mellitus without complications: Secondary | ICD-10-CM | POA: Insufficient documentation

## 2024-03-16 DIAGNOSIS — F39 Unspecified mood [affective] disorder: Secondary | ICD-10-CM | POA: Insufficient documentation

## 2024-03-16 DIAGNOSIS — Z7984 Long term (current) use of oral hypoglycemic drugs: Secondary | ICD-10-CM

## 2024-03-16 DIAGNOSIS — Z5320 Procedure and treatment not carried out because of patient's decision for unspecified reasons: Secondary | ICD-10-CM | POA: Insufficient documentation

## 2024-03-16 LAB — COMPREHENSIVE METABOLIC PANEL, NON-FASTING
ALBUMIN: 3.9 g/dL (ref 3.4–4.8)
ALKALINE PHOSPHATASE: 80 U/L (ref 55–145)
ALT (SGPT): 9 U/L (ref <31)
ANION GAP: 12 mmol/L (ref 4–13)
AST (SGOT): 20 U/L (ref 11–34)
BILIRUBIN TOTAL: 0.5 mg/dL (ref 0.3–1.3)
BUN/CREA RATIO: 12 (ref 6–22)
BUN: 13 mg/dL (ref 8–25)
CALCIUM: 9.6 mg/dL (ref 8.6–10.3)
CHLORIDE: 102 mmol/L (ref 96–111)
CO2 TOTAL: 21 mmol/L — ABNORMAL LOW (ref 23–31)
CREATININE: 1.13 mg/dL — ABNORMAL HIGH (ref 0.60–1.05)
ESTIMATED GFR - FEMALE: 50 mL/min/BSA — ABNORMAL LOW (ref >=60)
GLUCOSE: 172 mg/dL — ABNORMAL HIGH (ref 65–125)
POTASSIUM: 4.2 mmol/L (ref 3.5–5.1)
PROTEIN TOTAL: 7.6 g/dL (ref 6.0–8.0)
SODIUM: 135 mmol/L — ABNORMAL LOW (ref 136–145)

## 2024-03-16 LAB — THYROID STIMULATING HORMONE WITH FREE T4 REFLEX: TSH: 1.424 u[IU]/mL (ref 0.350–4.940)

## 2024-03-16 LAB — HGA1C (HEMOGLOBIN A1C WITH EST AVG GLUCOSE)
ESTIMATED AVERAGE GLUCOSE: 140 mg/dL
HEMOGLOBIN A1C: 6.5 % — ABNORMAL HIGH (ref <=5.7)

## 2024-03-16 NOTE — Nursing Note (Signed)
 Here for Medicare Well, unaccompanied  C/O acid reflux, difficulty sleeping at night  Has had multiple falls in past few months

## 2024-03-16 NOTE — Progress Notes (Signed)
 FAMILY MEDICINE, [facility] PROFESSIONAL BUILDING  [address]  [place] [address]  Operated by [facility]  Medicare Annual Wellness Visit and Problem Visit    Name: Elizabeth Page MRN:  [id number]   Date: 03/16/2024 Age: 78 y.o.     Chief Complaint   Patient presents with    Medicare Annual       Objective:  Elizabeth Page is a 78 y.o. female presenting for a Medicare Wellness exam and diabetes.  I have reviewed and reconciled the medication list with the patient today.        03/16/2024    12:49 PM   Comprehensive Health Assessment-Adult   Do you wish to complete this form? Yes   During the past 4 weeks, how would you rate your health in general? Poor   During the past 4 weeks, how much difficulty have you had doing your usual activities inside and outside your home because of medical or emotional problems? Could not do most activities   During the past 4 weeks, was someone available to help you if you needed and wanted help? Yes, as much as I wanted   In the past year, how many times have you gone to the emergency department or been admitted to a hospital for a health problem? 2-4 times   Are you generally satisfied with your sleep? No   Do you have enough money to buy things you need in everyday life, such as food, clothing, medicines, and housing? Yes, always   Can you get to places beyond walking distance without help?  (For example, can you drive your own car or travel alone on buses)? No   Do you fasten your seatbelt when you are in a car? Yes, usually   Do you exercise 20 minutes 3 or more days per week (such as walking, dancing, biking, mowing grass, swimming)? No, I usually don't exercise this much   How often do you eat food that is healthy (fruits, vegetables, lean meats) instead of unhealthy (sweets, fast food, junk food, fatty foods)? Some of the time   Have your parents, brothers or sisters had any of the following problems before the age of 39? (check all that apply) Mental  health problems such as depression, bipolar, severe anxiety, postpartum depression;Diabetes (sugar);Cancer   How often do you have trouble taking medicines the eay you are told to take them? I always take them as prescribed   Do you need any help communicating with your doctors and nurses because of vision or hearing problems? No   During the past 12 months, have you experienced confusion or memory loss that is happening more often or is getting worse? No   Do you have one person you think of as your personal doctor (primary care provider or family doctor)? Yes   If you are seeing a Primary Care Provider (PCP) or family doctor. please list their name Bonnita Buttner   Are you now also seeing any specialist physician(s) (such as eye doctor, foot doctor, skin doctor)? Yes   If you are seeing a specialist for anything such as foot, eye, skin, etc.  please list their name(s) [facility],   How confident are you that you can control or manage most of your health problems? Very confident       I have reviewed and updated as appropriate the past medical, family and social history. 03/16/2024 as summarized below:  Past Medical History:   Diagnosis Date  Anxiety     Depression     Diabetes mellitus, type 2     GERD (gastroesophageal reflux disease)     History of concussion     HTN (hypertension)     Hyperlipidemia     Stress     Ulcerative colitis      Past Surgical History:   Procedure Laterality Date    Hx cesarean section      Hx hysterectomy      Orif ankle fracture      Total abdominal hysterectomy       Current Outpatient Medications   Medication Sig    ACCU-CHEK AVIVA CONTROL SOLN Solution     ACCU-CHEK SOFTCLIX LANCETS Misc     Amlodipine -Olmesartan  5-20 mg Oral Tablet Take 1 Tablet by mouth Daily    clonazePAM  (KLONOPIN ) 0.5 mg Oral Tablet Take 1 Tablet (0.5 mg total) by mouth Three times a day as needed Indications: panic disorder    DULoxetine  (CYMBALTA  DR) 60 mg Oral Capsule, Delayed Release(E.C.) Take 1  Capsule (60 mg total) by mouth Daily Indications: major depressive disorder    levothyroxine  (SYNTHROID) 50 mcg Oral Tablet Take 1 Tablet (50 mcg total) by mouth Every morning    metformin  (GLUCOPHAGE -XR) 750 mg Oral Tablet Sustained Release 24 hr Take 1 Tablet (750 mg total) by mouth Twice daily    naproxen Sodium (ALEVE) 220 mg Oral Tablet Take 1 Tablet (220 mg total) by mouth Twice daily with food    omeprazole  (PRILOSEC) 40 mg Oral Capsule, Delayed Release(E.C.) Take 1 Capsule (40 mg total) by mouth Daily     Family Medical History:       Problem Relation (Age of Onset)    Other Mother, Father            Social History     Socioeconomic History    Marital status: Widowed   Tobacco Use    Smoking status: Never    Smokeless tobacco: Never   Substance and Sexual Activity    Alcohol use: No    Drug use: Never     Social Determinants of Health     Financial Resource Strain: Low Risk  (02/09/2024)    Financial Resource Strain     SDOH Financial: No   Transportation Needs: Medium Risk (02/09/2024)    Transportation Needs     SDOH Transportation: Yes, it has kept me from non-medical meetings, appointments, work, or from getting things that I need   Social Connections: Medium Risk (02/09/2024)    Social Connections     SDOH Social Isolation: 1 or 2 times a week   Intimate Partner Violence: Low Risk  (02/09/2024)    Intimate Partner Violence     SDOH Domestic Violence: No   Housing Stability: Low Risk  (02/09/2024)    Housing Stability     SDOH Housing Situation: I have housing.     SDOH Housing Worry: No   Health Literacy: Medium Risk (02/09/2024)    Health Literacy     SDOH Health Literacy: Sometimes   Employment Status: Low Risk  (02/09/2024)    Employment Status     SDOH Employment: Otherwise unemployed but not seeking work (ex. Consulting civil engineer, retired, disabled, unpaid primary care giver)         List of Current Health Care Providers   Care Team       PCP       Name Type Specialty Phone Number    [place], Lidia Reels, PA-C Physician  Assistant PHYSICIAN ASSISTANT [phone]              Care Team       No care team found                      Health Maintenance   Topic Date Due    Diabetic Retinal Exam  Never done    Osteoporosis screening  Never done    Hepatitis C screening  Never done    Diabetic Kidney Health Microalb/Cr Ratio  Never done    Pneumococcal Vaccination, Age 40+ (1 of 2 - PCV) Never done    Shingles Vaccine (1 of 2) Never done    RSV Adult 60+ or Pregnancy (1 - 1-dose 75+ series) Never done    Diabetic A1C  07/27/2022    Covid-19 Vaccine (1 - 2024-25 season) Never done    Medicare Annual Wellness Visit - Calendar Year Insurers  10/19/2023    Influenza Vaccine (Season Ended) 06/18/2024    Diabetic Kidney Health eGFR  11/10/2024    Adult Tdap-Td (2 - Td or Tdap) 09/04/2033     Medicare Wellness Assessment   Medicare initial or wellness physical in the last year?: No  Advance Directives   Does patient have a living will or MPOA: Yes   Has patient provided [employer] with a copy?: No   Patient advised to bring in copy.: Yes          Activities of Daily Living   Do you need help with dressing, bathing, or walking?: Yes   Do you need help with shopping, housekeeping, medications, or finances?: No   Do you have rugs in hallways, broken steps, or poor lighting?: No   Do you have grab bars in your bathroom, non-slip strips in your tub, and hand rails on your stairs?: Yes   Cognitive Function Screen (1=Yes, 0=No)   What is you age?: Correct   What is the time to the nearest hour?: Correct   What is the year?: Incorrect   What is the name of this clinic?: Correct   Can the patient recognize two persons (the doctor, the nurse, home help, etc.)?: Correct   What is the date of your birth? (day and month sufficient) : Correct   In what year did World War II end?: Incorrect   Who is the current president of the [place] ?: Correct   Count from 20 down to 1?: Correct   What address did I give you earlier?: Incorrect   Total Score: 7        Fall Risk Screen   Do you feel unsteady when standing or walking?: Yes  Do you worry about falling?: Yes  Have you fallen in the past year?: Yes  How many times have you fallen?: 2 or more times  Were you ever injured from falling?: Yes   Depression Screen     Little interest or pleasure in doing things.: More than half the days  Feeling down, depressed, or hopeless: More than half the days  PHQ 2 Total: 4  Trouble falling or staying asleep, or sleeping too much.: More than half the days  Feeling tired or having little energy: More than half the days  Poor appetite or overeating: Several Days  Feeling bad about yourself/ that you are a failure in the past 2 weeks?: Not at all  Trouble concentrating on things in the past 2 weeks?: Not at all  Moving/Speaking slowly or  being fidgety or restless  in the past 2 weeks?: Not at all  Thoughts that you would be better off DEAD, or of hurting yourself in some way.: Not at all  PHQ 9 Total: 9     Pain Score   Pain Score:  10 - Worst pain ever    Substance Use-Abuse Screening     Tobacco Use     In Past 12 MONTHS, how often have you used any tobacco product (for example, cigarettes, e-cigarettes, cigars, pipes, or smokeless tobacco)?: Never     Alcohol use     In the PAST 12 MONTHS, how often have you had 5 (men)/4 (women) or more drinks containing alcohol in one day?: Never     Prescription Drug Use     In the PAST 12 months, how often have you used any prescription medications just for the feeling, more than prescribed, or that were not prescribed for you? Prescriptions may include: opioids, benzodiazepines, medications for ADHD: Never           Illicit Drug Use   In the PAST 12 MONTHS, how often have you used any drugs, including marijuana, cocaine or crack, heroin, methamphetamine, hallucinogens, ecstasy/MDMA?: Never            Urine Incontinence Screen   Urinary Incontinence Screen  Do you ever leak urine when you don't want to?: YES                        Physical  exam:  Physical Exam  Vitals and nursing note reviewed.   HENT:      Head: Normocephalic and atraumatic.   Eyes:      Conjunctiva/sclera: Conjunctivae normal.      Pupils: Pupils are equal, round, and reactive to light.   Cardiovascular:      Rate and Rhythm: Normal rate and regular rhythm.      Heart sounds: Normal heart sounds.   Pulmonary:      Effort: Pulmonary effort is normal.      Breath sounds: Normal breath sounds.   Abdominal:      General: Bowel sounds are normal.      Palpations: Abdomen is soft.   Musculoskeletal:         General: Normal range of motion.      Cervical back: Normal range of motion and neck supple.   Skin:     General: Skin is warm and dry.   Neurological:      Mental Status: She is alert and oriented to person, place, and time.      Gait: Gait is intact.      Deep Tendon Reflexes: Reflexes are normal and symmetric.   Psychiatric:         Mood and Affect: Affect normal.         Cognition and Memory: Memory normal.         Judgment: Judgment normal.          Data reviewed:  Current Outpatient Medications   Medication Sig    ACCU-CHEK AVIVA CONTROL SOLN Solution     ACCU-CHEK SOFTCLIX LANCETS Misc     Amlodipine -Olmesartan  5-20 mg Oral Tablet Take 1 Tablet by mouth Daily    clonazePAM  (KLONOPIN ) 0.5 mg Oral Tablet Take 1 Tablet (0.5 mg total) by mouth Three times a day as needed Indications: panic disorder    DULoxetine  (CYMBALTA  DR) 60 mg Oral Capsule, Delayed Release(E.C.) Take 1 Capsule (60 mg total)  by mouth Daily Indications: major depressive disorder    levothyroxine  (SYNTHROID) 50 mcg Oral Tablet Take 1 Tablet (50 mcg total) by mouth Every morning    metformin  (GLUCOPHAGE -XR) 750 mg Oral Tablet Sustained Release 24 hr Take 1 Tablet (750 mg total) by mouth Twice daily    naproxen Sodium (ALEVE) 220 mg Oral Tablet Take 1 Tablet (220 mg total) by mouth Twice daily with food    omeprazole  (PRILOSEC) 40 mg Oral Capsule, Delayed Release(E.C.) Take 1 Capsule (40 mg total) by mouth Daily        ASSESSMENT FOR MEDICARE WELLNESS VISIT AND PROBLEM VISIT:  Problem List Items Addressed This Visit          Cardiovascular System    Essential hypertension       Endocrine    Type 2 diabetes mellitus with hyperglycemia, without long-term current use of insulin (CMS HCC)    Type 2 diabetes mellitus without complication, without long-term current use of insulin - Primary    Relevant Orders    COMPREHENSIVE METABOLIC PANEL, NON-FASTING    THYROID  STIMULATING HORMONE WITH FREE T4 REFLEX    HGA1C (HEMOGLOBIN A1C WITH EST AVG GLUCOSE)    MICROALBUMIN/CREATININE RATIO, URINE, RANDOM       Psychiatric    Mood disorder (CMS HCC)       Other    Encounter for Medicare annual wellness exam    Mammogram declined        PLAN:  ORDERS AS BELOW.  ROUTINE COUNSELING DONE.     Identified Risk Factors/ Recommended Actions     Fall Risk Follow up plan of care: Discussed optimizing home safety    Urinary Incontinence Plan of Care: Lifestyle modifications          Orders Placed This Encounter    COMPREHENSIVE METABOLIC PANEL, NON-FASTING    THYROID  STIMULATING HORMONE WITH FREE T4 REFLEX    HGA1C (HEMOGLOBIN A1C WITH EST AVG GLUCOSE)    MICROALBUMIN/CREATININE RATIO, URINE, RANDOM              The patient has been educated about risk factors and recommended preventive care. Written Prevention Plan completed/ updated and given to patient (see After Visit Summary).    Return in about 6 months (around 09/16/2024), or if symptoms worsen or fail to improve.    Hurman Maiden, PA-C  03/16/2024, 13:25

## 2024-03-16 NOTE — Patient Instructions (Signed)
 Medicare Preventive Services  Medicare coverage information Recommendation for YOU   Heart Disease and Diabetes   Lipid profile Every 5 years or more often if at risk for cardiovascular disease     Lab Results   Component Value Date    CHOLESTEROL 191 02/22/2022    HDLCHOL 41 (L) 02/22/2022    LDLCHOL 107 (H) 02/22/2022    TRIG 252 (H) 02/22/2022         Diabetes Screening    Yearly for those at risk for diabetes, 2 tests per year for those with prediabetes Last Glucose:      Diabetes Self Management Training or Medical Nutrition Therapy  For those with diabetes, up to 10 hrs initial training within a year, subsequent years up to 2 hrs of follow up training Optional for those with diabetes     Medical Nutrition Therapy  Three hours of one-on-one counseling in first year, two hours in subsequent years Optional for those with diabetes, kidney disease   Intensive Behavioral Therapy for Obesity  Face-to-face counseling, first month every week, month 2-6 every other week, month 7-12 every month if continued progress is documented Optional for those with Body Mass Index 30 or higher  Your Body mass index is 25.49 kg/m.   Tobacco Cessation (Quitting) Counseling   Covers up to 8 smoking and tobacco-use cessation counseling sessions in a 37-month period.    Optional for those that use tobacco   Cancer Screening Last Completion Date   Colorectal screening   For anyone age 69 to 67 or any age if high risk:  Screening Colonoscopy every 10 yrs if low risk,  more frequent if higher risk  OR  Cologuard Stool DNA test once every 3 years OR  Fecal Occult Blood Testing yearly OR  Flexible  Sigmoidoscopy  every 5 yr OR  CT Colonography every 5 yrs      See below for due date if applicable.   Screening Pap Test   Recommended every 3 years for all women age 25 to 69, or every five years if combined with HPV test (routine screening not needed after total hysterectomy).  Medicare covers every 2 years or yearly if high risk.  Screening  Pelvic Exam   Medicare covers every 2 years, yearly if high risk or childbearing age with abnormal Pap in last 3 yrs.     See below for due date if applicable.   Screening Mammogram   Recommended every 2 years for women age 82 to 30, or more frequent if you have a higher risk. Selectively recommended for women between 40-49 based on shared decisions about risk. Covered by Medicare up to every year for women age 45 or older   See below for due date if applicable.         Lung Cancer Screening  Annual low dose computed tomography (LDCT scan) is recommended for those age 54-80 who smoked 20 pack-years and are current smokers or quit smoking within past 15 years, after counseling by your doctor or nurse clinician about the possible benefits or harms.     See below for due date if applicable.   Vaccinations   Respiratory syncytial virus (RSV)  Age 5 years or older: Based on shared clinical decision-making with your provider.  Pneumococcal Vaccine  Recommended routinely age 42+ with one or two separate vaccines based on your risk. Recommended before age 69 if medical conditions with increased risk  Seasonal Influenza Vaccine  Once every flu season  Hepatitis B Vaccine  3 doses if risk (including anyone with diabetes or liver disease)  Shingles Vaccine  Two doses at age 68 or older  Diphtheria Tetanus Pertussis Vaccine  ONCE as adult, booster every 10 years     Immunization History   Administered Date(s) Administered    Tetanus Toxoid/Diphtheria Toxoid/Acellular Pertussis Vaccine, Adsorbed 09/05/2023     Shingles vaccine and Diphtheria Tetanus Pertussis vaccines are available at pharmacies or local health department without a prescription.   Other Preventative Screening  Last Completion Date   Bone Densitometry   Screening: All females ages 19 and older every 10 years if initial screening normal. Postmenopausal women ages 73-64 need screening with one or more risk factor: previous fracture, parental hip fracture, current  smoker, low body weight, excessive alcohol use, Rheumatoid Arthritis   For women with diagnosed Osteoporosis, follow up is recommended every 2 years or a frequency recommended by your provider.       See below for due date if applicable.     Glaucoma Screening   Yearly if in high risk group such as diabetes, family history, African American age 15+ or Hispanic American age 49+   See your eye care provider for screening.   Hepatitis C Screening   Recommended  for those born between ages 18-79 years.     See below for due date if applicable.     HIV Testing  Recommended routinely at least ONCE, covered every year for age 62 to 61 regardless of risk, and every year for age over 82 who ask for the test or higher risk. Yearly or up to 3 times in pregnancy         See below for due date if applicable.   Abdominal Aortic Aneurysm Screening Ultrasound   Once with a family history of abdominal aortic aneurysms OR a female between 36-75 and have smoked at least 100 cigarettes in your lifetime.         See below for due date if applicable.       Your Personalized Schedule for Preventive Tests     Health Maintenance: Pending and Last Completed         Date Due Completion Date    Diabetic Retinal Exam Never done ---    Osteoporosis screening Never done ---    Hepatitis C screening Never done ---    Diabetic Kidney Health Microalb/Cr Ratio Never done ---    Pneumococcal Vaccination, Age 78+ (1 of 2 - PCV) Never done ---    Shingles Vaccine (1 of 2) Never done ---    RSV Adult 60+ or Pregnancy (1 - 1-dose 75+ series) Never done ---    Diabetic A1C 07/27/2022 01/25/2022    Covid-19 Vaccine (1 - 2024-25 season) Never done ---    Medicare Annual Wellness Visit - Calendar Year Insurers 10/19/2023 04/14/2021    Influenza Vaccine (Season Ended) 06/18/2024 ---    Diabetic Kidney Health eGFR 11/10/2024 11/11/2023    Adult Tdap-Td (2 - Td or Tdap) 09/04/2033 09/05/2023                  For Information on Advanced Directives for Health  Care:  [place]:  [email or web address]  [place], [place], [place], [place] General Information: [email or web address]

## 2024-04-05 ENCOUNTER — Other Ambulatory Visit (HOSPITAL_BASED_OUTPATIENT_CLINIC_OR_DEPARTMENT_OTHER): Payer: Self-pay | Admitting: PHYSICIAN ASSISTANT

## 2024-04-05 MED ORDER — DULOXETINE 60 MG CAPSULE,DELAYED RELEASE
60.0000 mg | DELAYED_RELEASE_CAPSULE | Freq: Every day | ORAL | 1 refills | Status: DC
Start: 2024-04-05 — End: 2024-06-05

## 2024-04-05 MED ORDER — OMEPRAZOLE 40 MG CAPSULE,DELAYED RELEASE
40.0000 mg | DELAYED_RELEASE_CAPSULE | Freq: Every day | ORAL | 1 refills | Status: DC
Start: 2024-04-05 — End: 2024-07-05

## 2024-04-13 ENCOUNTER — Other Ambulatory Visit (HOSPITAL_BASED_OUTPATIENT_CLINIC_OR_DEPARTMENT_OTHER): Payer: Self-pay | Admitting: PHYSICIAN ASSISTANT

## 2024-04-13 MED ORDER — LEVOTHYROXINE 50 MCG TABLET
50.0000 ug | ORAL_TABLET | Freq: Every morning | ORAL | 2 refills | Status: DC
Start: 2024-04-13 — End: 2024-05-15

## 2024-04-23 ENCOUNTER — Encounter (HOSPITAL_COMMUNITY): Payer: Self-pay

## 2024-04-23 ENCOUNTER — Emergency Department (HOSPITAL_COMMUNITY)

## 2024-04-23 ENCOUNTER — Other Ambulatory Visit: Payer: Self-pay

## 2024-04-23 ENCOUNTER — Emergency Department
Admission: EM | Admit: 2024-04-23 | Discharge: 2024-04-23 | Disposition: A | Discharge status: Home / Self Care | Attending: Student in an Organized Health Care Education/Training Program | Admitting: Student in an Organized Health Care Education/Training Program

## 2024-04-23 DIAGNOSIS — R0602 Shortness of breath: Secondary | ICD-10-CM

## 2024-04-23 DIAGNOSIS — R531 Weakness: Secondary | ICD-10-CM

## 2024-04-23 DIAGNOSIS — F22 Delusional disorders: Secondary | ICD-10-CM | POA: Insufficient documentation

## 2024-04-23 DIAGNOSIS — R9431 Abnormal electrocardiogram [ECG] [EKG]: Secondary | ICD-10-CM | POA: Insufficient documentation

## 2024-04-23 DIAGNOSIS — E871 Hypo-osmolality and hyponatremia: Secondary | ICD-10-CM | POA: Insufficient documentation

## 2024-04-23 DIAGNOSIS — N3000 Acute cystitis without hematuria: Secondary | ICD-10-CM | POA: Insufficient documentation

## 2024-04-23 LAB — COMPREHENSIVE METABOLIC PANEL, NON-FASTING
ALBUMIN: 3.5 g/dL (ref 3.4–4.8)
ALKALINE PHOSPHATASE: 81 U/L (ref 55–145)
ALT (SGPT): 9 U/L (ref <31)
ANION GAP: 10 mmol/L (ref 4–13)
AST (SGOT): 15 U/L (ref 11–34)
BILIRUBIN TOTAL: 0.4 mg/dL (ref 0.3–1.3)
BUN/CREA RATIO: 16 (ref 6–22)
BUN: 16 mg/dL (ref 8–25)
CALCIUM: 9.1 mg/dL (ref 8.6–10.3)
CHLORIDE: 100 mmol/L (ref 96–111)
CO2 TOTAL: 22 mmol/L — ABNORMAL LOW (ref 23–31)
CREATININE: 0.99 mg/dL (ref 0.60–1.05)
ESTIMATED GFR - FEMALE: 59 mL/min/BSA — ABNORMAL LOW (ref >=60)
GLUCOSE: 151 mg/dL — ABNORMAL HIGH (ref 65–125)
POTASSIUM: 4.2 mmol/L (ref 3.5–5.1)
PROTEIN TOTAL: 6.8 g/dL (ref 6.0–8.0)
SODIUM: 132 mmol/L — ABNORMAL LOW (ref 136–145)

## 2024-04-23 LAB — URINALYSIS, MACROSCOPIC
BILIRUBIN: NEGATIVE mg/dL
BLOOD: NEGATIVE mg/dL
GLUCOSE: NEGATIVE mg/dL
KETONES: NEGATIVE mg/dL
NITRITE: NEGATIVE
PH: 5.5 (ref 5.0–8.0)
PROTEIN: NEGATIVE mg/dL
SPECIFIC GRAVITY: 1.014 (ref 1.002–1.030)
UROBILINOGEN: <2 mg/dL (ref <2.0)

## 2024-04-23 LAB — DRUG SCREEN, NO CONFIRMATION, URINE
AMPHETAMINES, URINE: NEGATIVE
BARBITURATES URINE: NEGATIVE
BENZODIAZEPINES URINE: NEGATIVE
BUPRENORPHINE URINE: NEGATIVE
CANNABINOIDS URINE: NEGATIVE
COCAINE METABOLITES URINE: NEGATIVE
CREATININE RANDOM URINE: 100 mg/dL (ref >=20)
ECSTASY/MDMA URINE: NEGATIVE
FENTANYL, RANDOM URINE: NEGATIVE
METHADONE URINE: NEGATIVE
OPIATES URINE (LOW CUTOFF): NEGATIVE
OXYCODONE URINE: NEGATIVE

## 2024-04-23 LAB — CBC WITH DIFF
BASOPHIL #: <0.1 x10ˆ3/uL (ref <=0.20)
BASOPHIL %: 0.7 %
EOSINOPHIL #: <0.1 x10ˆ3/uL (ref <=0.50)
EOSINOPHIL %: 1.3 %
HCT: 31.8 % — ABNORMAL LOW (ref 34.8–46.0)
HGB: 10.5 g/dL — ABNORMAL LOW (ref 11.5–16.0)
IMMATURE GRANULOCYTE #: <0.1 x10ˆ3/uL (ref <0.10)
IMMATURE GRANULOCYTE %: 0.4 % (ref 0.0–1.0)
LYMPHOCYTE #: 2 x10ˆ3/uL (ref 1.00–4.80)
LYMPHOCYTE %: 35.8 %
MCH: 25.7 pg — ABNORMAL LOW (ref 26.0–32.0)
MCHC: 33 g/dL (ref 31.0–35.5)
MCV: 77.9 fL — ABNORMAL LOW (ref 78.0–100.0)
MONOCYTE #: 0.62 x10ˆ3/uL (ref 0.20–1.10)
MONOCYTE %: 11.1 %
MPV: 9.3 fL (ref 8.7–12.5)
NEUTROPHIL #: 2.84 x10ˆ3/uL (ref 1.50–7.70)
NEUTROPHIL %: 50.7 %
PLATELETS: 286 x10ˆ3/uL (ref 150–400)
RBC: 4.08 x10ˆ6/uL (ref 3.85–5.22)
RDW-CV: 14 % (ref 11.5–15.5)
WBC: 5.6 x10ˆ3/uL (ref 3.7–11.0)

## 2024-04-23 LAB — TROPONIN-I: TROPONIN-I HS: <2.7 ng/L (ref <=14.0)

## 2024-04-23 LAB — URINALYSIS, MICROSCOPIC

## 2024-04-23 LAB — ETHANOL, SERUM/PLASMA
ETHANOL: <10 mg/dL (ref <10)
ETHANOL: NOT DETECTED

## 2024-04-23 LAB — PT/INR
INR: 1.03 (ref 0.83–1.16)
PROTHROMBIN TIME: 11.7 s (ref 9.5–13.4)

## 2024-04-23 LAB — MAGNESIUM: MAGNESIUM: 1.3 mg/dL — ABNORMAL LOW (ref 1.8–2.6)

## 2024-04-23 MED ORDER — MAGNESIUM OXIDE 400 MG (241.3 MG MAGNESIUM) TABLET
400.0000 mg | ORAL_TABLET | Freq: Every day | ORAL | 0 refills | Status: DC
Start: 2024-04-23 — End: 2024-06-05

## 2024-04-23 MED ORDER — SODIUM CHLORIDE 0.9 % INTRAVENOUS SOLUTION
1.0000 g | Freq: Once | INTRAVENOUS | Status: AC
Start: 2024-04-23 — End: 2024-04-23
  Administered 2024-04-23: 0 g via INTRAVENOUS
  Administered 2024-04-23: 1 g via INTRAVENOUS
  Filled 2024-04-23: qty 10

## 2024-04-23 MED ORDER — MAGNESIUM SULFATE 2 GRAM/50 ML (4 %) IN WATER INTRAVENOUS PIGGYBACK
2.0000 g | INJECTION | Freq: Once | INTRAVENOUS | Status: AC
Start: 2024-04-23 — End: 2024-04-23
  Administered 2024-04-23: 2 g via INTRAVENOUS
  Administered 2024-04-23: 0 g via INTRAVENOUS
  Filled 2024-04-23: qty 50

## 2024-04-23 MED ORDER — CEPHALEXIN 500 MG CAPSULE
500.0000 mg | ORAL_CAPSULE | Freq: Two times a day (BID) | ORAL | 0 refills | Status: AC
Start: 2024-04-23 — End: 2024-04-30

## 2024-04-23 NOTE — ED Nurses Note (Signed)

## 2024-04-23 NOTE — ED Triage Notes (Signed)
 Pt arrived via ems. Pt states she can't eat or sleep. Pt states she falls easily and staggers. She states sometimes she don't eat for days or sleep for days. She says she can't get any rest at her apt.
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
## 2024-04-23 NOTE — ED Nurses Note (Signed)
Pt placed on portable monitor. 

## 2024-04-23 NOTE — Discharge Instructions (Addendum)
 Start cephalexin  500 mg twice a day for 7 days for your urinary tract infection.  Start magnesium  once daily for 14 days.  Follow up with the PCP in the next few days for recheck.  If symptoms change or worsen seek medical attention immediately.
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
## 2024-04-23 NOTE — ED Nurses Note (Signed)
Pt sleeping in bed

## 2024-04-24 ENCOUNTER — Telehealth (HOSPITAL_COMMUNITY): Payer: Self-pay | Admitting: PHYSICIAN ASSISTANT

## 2024-04-24 NOTE — Progress Notes (Signed)
 Post Ed Follow-Up    Post ED Follow-Up:   Document completed and/or attempted interactive contact(s) after transition to home after emergency department stay.:   Transition Facility and relevant Date:   Discharge Date: 04/23/24  Discharge from [facility] Emergency Department?: Yes  Discharge Facility: [facility]  Contacted by: Izetta Pass, RN  Contact method: Patient/Caregiver Telephone  Contact first attempt: 04/24/2024  2:02 PM  MyChart message sent?: No  Follow Up Visit: No answer - left message  Nurse Navigator toll free number, resources available, and hours of operation provided to patient and/or caregiver.        Izetta LITTIE Pass, RN

## 2024-04-25 LAB — URINE CULTURE,ROUTINE: URINE CULTURE: NO GROWTH

## 2024-05-03 DIAGNOSIS — R9431 Abnormal electrocardiogram [ECG] [EKG]: Secondary | ICD-10-CM

## 2024-05-03 LAB — ECG 12-LEAD
Atrial Rate: 84 {beats}/min
Calculated P Axis: 34 degrees
Calculated R Axis: -14 degrees
Calculated T Axis: 63 degrees
PR Interval: 150 ms
QRS Duration: 68 ms
QT Interval: 362 ms
QTC Calculation: 427 ms
Ventricular rate: 84 {beats}/min

## 2024-05-11 ENCOUNTER — Telehealth (INDEPENDENT_AMBULATORY_CARE_PROVIDER_SITE_OTHER): Payer: Self-pay | Admitting: PSYCHIATRY
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
## 2024-05-15 ENCOUNTER — Other Ambulatory Visit (HOSPITAL_BASED_OUTPATIENT_CLINIC_OR_DEPARTMENT_OTHER): Payer: Self-pay | Admitting: PHYSICIAN ASSISTANT

## 2024-05-15 MED ORDER — LEVOTHYROXINE 50 MCG TABLET
50.0000 ug | ORAL_TABLET | Freq: Every morning | ORAL | 5 refills | Status: AC
Start: 2024-05-15 — End: ?

## 2024-05-21 ENCOUNTER — Other Ambulatory Visit (HOSPITAL_BASED_OUTPATIENT_CLINIC_OR_DEPARTMENT_OTHER): Payer: Self-pay | Admitting: PHYSICIAN ASSISTANT

## 2024-06-05 ENCOUNTER — Ambulatory Visit (INDEPENDENT_AMBULATORY_CARE_PROVIDER_SITE_OTHER): Payer: Self-pay | Admitting: PSYCHIATRY

## 2024-06-05 ENCOUNTER — Other Ambulatory Visit: Payer: Self-pay

## 2024-06-05 ENCOUNTER — Encounter (INDEPENDENT_AMBULATORY_CARE_PROVIDER_SITE_OTHER): Payer: Self-pay | Admitting: PSYCHIATRY

## 2024-06-05 VITALS — BP 107/70 | HR 95 | Resp 18 | Ht 62.5 in | Wt 145.0 lb

## 2024-06-05 DIAGNOSIS — F3341 Major depressive disorder, recurrent, in partial remission: Secondary | ICD-10-CM

## 2024-06-05 DIAGNOSIS — F22 Delusional disorders: Secondary | ICD-10-CM

## 2024-06-05 DIAGNOSIS — F410 Panic disorder [episodic paroxysmal anxiety] without agoraphobia: Secondary | ICD-10-CM

## 2024-06-05 MED ORDER — ARIPIPRAZOLE 2 MG TABLET: 2.0000 mg | ORAL_TABLET | Freq: Every day | ORAL | 3 refills | Status: DC

## 2024-06-05 MED ORDER — CLONAZEPAM 0.5 MG TABLET: 0.5000 mg | ORAL_TABLET | Freq: Three times a day (TID) | ORAL | 3 refills | Status: DC | PRN

## 2024-06-05 MED ORDER — DULOXETINE 60 MG CAPSULE,DELAYED RELEASE: 60.0000 mg | DELAYED_RELEASE_CAPSULE | Freq: Every day | ORAL | 1 refills | Status: DC
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
## 2024-06-05 NOTE — Nursing Note (Signed)
 06/05/24 1259   [place] Suicide Severity Rating Scale (Since Last Contact Screener)   1. Wish to be Dead (Since Last Contact) N   2. Non-Specific Active Suicidal Thoughts (Since Last Contact) N   6. Suicidal Behavior (Since Last Contact) N   Calculated C-SSRS Risk Score (Since Last Contact) No Risk Indicated
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
## 2024-06-05 NOTE — Nursing Note (Signed)
 06/05/24 1258   PHQ 9 (follow up)   Little interest or pleasure in doing things. 0   Feeling down, depressed, or hopeless 3   PHQ 2 Total 3   Trouble falling or staying asleep, or sleeping too much. 3   Feeling tired or having little energy 3   Poor appetite or overeating 3   Feeling bad about yourself/ that you are a failure in the past 2 weeks? 0   Trouble concentrating on things in the past 2 weeks? 0   Moving/Speaking slowly or being fidgety or restless  in the past 2 weeks? 0   Thoughts that you would be better off DEAD, or of hurting yourself in some way. 0   PHQ 9 Total 12   Interpretation of Total Score Moderate depression

## 2024-06-05 NOTE — Nursing Note (Signed)
 06/05/24 1257   Fall Risk Assessment   Do you feel unsteady when standing or walking? Yes   Do you worry about falling? No   Have you fallen in the past year? No

## 2024-06-06 ENCOUNTER — Other Ambulatory Visit (HOSPITAL_BASED_OUTPATIENT_CLINIC_OR_DEPARTMENT_OTHER): Payer: Self-pay | Admitting: PHYSICIAN ASSISTANT

## 2024-06-06 DIAGNOSIS — E119 Type 2 diabetes mellitus without complications: Secondary | ICD-10-CM

## 2024-07-20 ENCOUNTER — Ambulatory Visit (HOSPITAL_BASED_OUTPATIENT_CLINIC_OR_DEPARTMENT_OTHER): Payer: Self-pay | Admitting: PHYSICIAN ASSISTANT

## 2024-08-03 ENCOUNTER — Encounter (INDEPENDENT_AMBULATORY_CARE_PROVIDER_SITE_OTHER): Payer: Self-pay | Admitting: PSYCHIATRY

## 2024-08-17 ENCOUNTER — Encounter (INDEPENDENT_AMBULATORY_CARE_PROVIDER_SITE_OTHER): Admitting: PSYCHIATRY

## 2024-08-20 ENCOUNTER — Other Ambulatory Visit (HOSPITAL_BASED_OUTPATIENT_CLINIC_OR_DEPARTMENT_OTHER): Payer: Self-pay | Admitting: PHYSICIAN ASSISTANT

## 2024-08-20 ENCOUNTER — Other Ambulatory Visit (INDEPENDENT_AMBULATORY_CARE_PROVIDER_SITE_OTHER): Payer: Self-pay | Admitting: PSYCHIATRY

## 2024-09-17 ENCOUNTER — Other Ambulatory Visit: Payer: Self-pay

## 2024-09-17 ENCOUNTER — Encounter (INDEPENDENT_AMBULATORY_CARE_PROVIDER_SITE_OTHER): Payer: Self-pay | Admitting: Nurse Practitioner

## 2024-09-18 ENCOUNTER — Encounter (INDEPENDENT_AMBULATORY_CARE_PROVIDER_SITE_OTHER): Payer: Self-pay | Admitting: PSYCHIATRY

## 2024-09-18 ENCOUNTER — Ambulatory Visit (INDEPENDENT_AMBULATORY_CARE_PROVIDER_SITE_OTHER): Admitting: PSYCHIATRY

## 2024-09-18 VITALS — BP 98/62 | HR 92 | Resp 18 | Ht 62.5 in | Wt 153.0 lb

## 2024-09-18 DIAGNOSIS — F410 Panic disorder [episodic paroxysmal anxiety] without agoraphobia: Secondary | ICD-10-CM

## 2024-09-18 DIAGNOSIS — F22 Delusional disorders: Secondary | ICD-10-CM

## 2024-09-18 DIAGNOSIS — F3341 Major depressive disorder, recurrent, in partial remission: Secondary | ICD-10-CM

## 2024-09-18 MED ORDER — CLONAZEPAM 0.5 MG TABLET: 0.5000 mg | ORAL_TABLET | Freq: Three times a day (TID) | ORAL | 3 refills | Status: AC | PRN

## 2024-09-18 MED ORDER — ARIPIPRAZOLE 2 MG TABLET: 2.0000 mg | ORAL_TABLET | Freq: Every day | ORAL | 1 refills | Status: AC

## 2024-09-18 MED ORDER — DULOXETINE 60 MG CAPSULE,DELAYED RELEASE: 60.0000 mg | DELAYED_RELEASE_CAPSULE | Freq: Every day | ORAL | 1 refills | Status: AC

## 2024-09-18 NOTE — Progress Notes (Signed)
 BEHAVIORAL MEDICINE, [facility]  [address]  [place] [address]       Name: Elizabeth Page MRN:  [id number]   Date: 09/18/2024 Age: 78 y.o.       History:        Last visit:  06/05/2024    Interval History:  Had a new apartment and was sent to [facility] apartment and was told the rent would be raised and was there less than a month.  Is staying at the sleep in motel and likes it there but the rent adds up. Has been looking for a new apartment.  Son calls infrequently from [facility]. Patient has applied for veterans benefits for her deceased husband (died 22 years ago of suicide) but [employer] cannot find his records.    Bothered by neuropathic pain in her hands. The pain overwhelms her at times.  Homeless issues stress her and issues with son.     Feels depressed and sleeps a lot during most days.  Does not have a good diet. Wants PT to exercise some and I told her to ask her PCP to send her. Has one friend Maryann who does drive her places.    Panic attacks are still every day and takes the klonopin  0.5mg  prn 2-3 times daily.      Substance use: Denies any substance use.    Review of systems:   Constitutional: Denies any medication side effect.  GI: Denies any diarrhea, constipation.  Dermatological:  Denies any skin rash    Medical and Surgical history in the interim: Non-contributory.  Past Medical History:   Diagnosis Date    Anxiety     Depression     Diabetes mellitus, type 2     GERD (gastroesophageal reflux disease)     History of concussion     HTN (hypertension)     Hyperlipidemia     Stress     Ulcerative colitis          Past Surgical History:   Procedure Laterality Date    HX CESAREAN SECTION      HX HYSTERECTOMY      ORIF ANKLE FRACTURE      TOTAL ABDOMINAL HYSTERECTOMY           Current Outpatient Medications   Medication Sig Dispense Refill    ACCU-CHEK AVIVA CONTROL SOLN Solution       ACCU-CHEK SOFTCLIX LANCETS Misc       Amlodipine -Olmesartan  5-20 mg Oral Tablet TAKE 1 TABLET  BY MOUTH ONCE A DAY 90 Tablet 0    ARIPiprazole  (ABILIFY ) 2 mg Oral Tablet Take 1 Tablet (2 mg total) by mouth Daily Indications: additional treatment for major depressive disorder 30 Tablet 3    clonazePAM  (KLONOPIN ) 0.5 mg Oral Tablet Take 1 Tablet (0.5 mg total) by mouth Three times a day as needed Indications: panic disorder 90 Tablet 3    DULoxetine  (CYMBALTA  DR) 60 mg Oral Capsule, Delayed Release(E.C.) Take 1 Capsule (60 mg total) by mouth Daily Indications: major depressive disorder 90 Capsule 1    levothyroxine  (SYNTHROID) 50 mcg Oral Tablet Take 1 Tablet (50 mcg total) by mouth Every morning 30 Tablet 5    metformin  (GLUCOPHAGE -XR) 750 mg Oral Tablet Sustained Release 24 hr TAKE 1 TABLET BY MOUTH TWICE A DAY 180 Tablet 1    naproxen Sodium (ALEVE) 220 mg Oral Tablet Take 1 Tablet (220 mg total) by mouth Twice daily with food      omeprazole  (PRILOSEC)  40 mg Oral Capsule, Delayed Release(E.C.) TAKE 1 CAPSULE BY MOUTH ONCE A DAY 90 Capsule 1     No current facility-administered medications for this visit.        Allergies: Allergies[1]      Exam:      BP 98/62   Pulse 92   Resp 18   Ht 1.588 m (5' 2.5)   Wt 69.4 kg (153 lb)   SpO2 98%   BMI 27.54 kg/m       General appearance: Well dressed, well groomed.  Cooperative, good eye contact.  Musculoskeletal: Normal gait and station. Normal muscle strength and tone.  No abnormal movements. No TD.  Speech: Articulate, appropriate with spontaneous elaborations.  Normal rate and volume.  Affect: Congruent with mood which is described as "not so good.  Thought process: Logical, linear and goal directed.  Stays on subject well.  Association: Intact  Abnormal thought: None/denies hopelessness, death wishes or suicidal ideations.  Denies violent thoughts or psychotic symptoms.  Judgement and Insight:  Both are intact.  Attention and concentration: Adequate  Language: Fluent use of English.    Assessment: Diagnosis: unchanged.   Psychiatrically reasonable stable.   Psychologically remains lonely, in pain, stressed.    Substance use is not a concern. No legal issues. No acute suicide risk. Discussed status and reviewed options for therapy. Discussed medications, psychotherapy and structures/schedule of program. Reviewed plan, goals, rationale, risks, benefits, and alternatives. Patient indicates understanding and agreement. Has decision making capacity.        ICD-10-CM    1. Recurrent major depressive disorder, in partial remission (CMS HCC)  F33.41       2. Panic disorder  F41.0       3. Paranoid ideation (CMS HCC)  F22            Plan:   Medications: Continue current which are Abilify  2mg  po qam, Cymbalta  60mg  po qam and klonopin  0.5mg  po tid prn panic attack.  Therapy:  Continue current supportive and educational work.  RTC:  3 months.  Labs/test/imagining: None psychiatrically indicated.  Lipids/TG/BS are checked regularly by her PCP.  I spent approximately 33 minutes on this case today with the patient present over 66% of that time we discussed her treatment plan, issues, medications, potential medication adverse effects and I obtained informed consent.  Over 50% of her time was spent on counseling, supportive psychotherapy and patient education.    Deward Linden, MD      This note was partially created using voice recognition software and is inherently subject to errors including those of syntax and sound-alike" substitutions which may escape proofreading.  In such instances, original meaning may be extrapolated by contextual derivation.       [1]   Allergies  Allergen Reactions    Allergy-Decongestant [Dexbrompheniramine-Pseudoephed]     Codeine     Cortisone     Motrin [Ibuprofen]

## 2024-09-18 NOTE — Nursing Note (Signed)
 09/18/24 1003   [place] Suicide Severity Rating Scale (Since Last Contact Screener)   1. Wish to be Dead (Since Last Contact) N   2. Have you actually had any thoughts of killing yourself? N   6. Suicidal Behavior (Since Last Contact) N   Calculated C-SSRS Risk Score (Since Last Contact) No Risk Indicated

## 2024-09-18 NOTE — Nursing Note (Signed)
 09/18/24 1003   PHQ 9 (follow up)   Little interest or pleasure in doing things. 3   Feeling down, depressed, or hopeless 3   PHQ 2 Total 6   Trouble falling or staying asleep, or sleeping too much. 3   Feeling tired or having little energy 3   Poor appetite or overeating 3   Feeling bad about yourself/ that you are a failure in the past 2 weeks? 3   Trouble concentrating on things in the past 2 weeks? 0   Moving/Speaking slowly or being fidgety or restless  in the past 2 weeks? 3   Thoughts that you would be better off DEAD, or of hurting yourself in some way. 0   If you checked off any problems, how difficult have these problems made it for you to do your work, take care of things at home, or get along with other people? Extremely difficult   PHQ 9 Total 21   Interpretation of Total Score Severe depression

## 2024-09-20 ENCOUNTER — Other Ambulatory Visit (HOSPITAL_BASED_OUTPATIENT_CLINIC_OR_DEPARTMENT_OTHER): Payer: Self-pay | Admitting: PHYSICIAN ASSISTANT

## 2024-09-20 DIAGNOSIS — R296 Repeated falls: Secondary | ICD-10-CM

## 2024-11-01 ENCOUNTER — Encounter (INDEPENDENT_AMBULATORY_CARE_PROVIDER_SITE_OTHER): Payer: Self-pay | Admitting: PSYCHIATRY

## 2024-11-02 ENCOUNTER — Encounter (INDEPENDENT_AMBULATORY_CARE_PROVIDER_SITE_OTHER): Payer: Self-pay

## 2024-11-02 ENCOUNTER — Other Ambulatory Visit (INDEPENDENT_AMBULATORY_CARE_PROVIDER_SITE_OTHER): Payer: Self-pay

## 2024-11-02 ENCOUNTER — Encounter (HOSPITAL_BASED_OUTPATIENT_CLINIC_OR_DEPARTMENT_OTHER): Payer: Self-pay | Admitting: PHYSICIAN ASSISTANT

## 2024-11-02 ENCOUNTER — Other Ambulatory Visit (HOSPITAL_BASED_OUTPATIENT_CLINIC_OR_DEPARTMENT_OTHER): Payer: Self-pay | Admitting: PHYSICIAN ASSISTANT

## 2024-11-02 DIAGNOSIS — E1142 Type 2 diabetes mellitus with diabetic polyneuropathy: Secondary | ICD-10-CM

## 2024-11-02 DIAGNOSIS — M4802 Spinal stenosis, cervical region: Secondary | ICD-10-CM

## 2024-11-02 DIAGNOSIS — L03115 Cellulitis of right lower limb: Secondary | ICD-10-CM

## 2024-11-02 DIAGNOSIS — Z7189 Other specified counseling: Secondary | ICD-10-CM

## 2024-11-02 DIAGNOSIS — M6250 Muscle wasting and atrophy, not elsewhere classified, unspecified site: Secondary | ICD-10-CM
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
## 2024-11-02 NOTE — Nursing Note (Signed)
 11/02/24 1200   Medicare Wellness Assessment   Medicare initial or wellness physical in the last year? Yes   Advance Directives   Does patient have a living will or MPOA No   Advance directive information given to the patient today? Patient Declined   Activities of Daily Living   Do you need help with dressing, bathing, or walking? No   Do you need help with shopping, housekeeping, medications, or finances? Yes   Do you have rugs in hallways, broken steps, or poor lighting? No   Do you have grab bars in your bathroom, non-slip strips in your tub, and hand rails on your stairs? Yes   Cognitive Function Screen   What is you age? 1   What is the time to the nearest hour? 1   What is the year? 1   What is the name of this clinic? 1   Can the patient recognize two persons (the doctor, the nurse, home help, etc.)? 1   What is the date of your birth? (day and month sufficient)  1   In what year did World War II end? 0   Who is the current president of the [place] ? 1   Count from 20 down to 1? 1   What address did I give you earlier? 1   Total Score 9   Interpretation of Total Score Greater than 6 Normal   Depression Screen   Little interest or pleasure in doing things. 2   Feeling down, depressed, or hopeless 2   PHQ 2 Total 4   Trouble falling or staying asleep, or sleeping too much. 1   Feeling tired or having little energy 2   Poor appetite or overeating 1   Feeling bad about yourself/ that you are a failure in the past 2 weeks? 1   Trouble concentrating on things in the past 2 weeks? 0   Moving/Speaking slowly or being fidgety or restless  in the past 2 weeks? 1   Thoughts that you would be better off DEAD, or of hurting yourself in some way. 0   PHQ 9 Total 10   Interpretation of Total Score Moderate depression   Pain Score   Pain Score TEN  (hands)   Substance Use Screening   In Past 12 MONTHS, how often have you used any tobacco product (for example, cigarettes, e-cigarettes, cigars, pipes, or smokeless  tobacco)? Never   In the PAST 12 MONTHS, how often have you had 5 (men)/4 (women) or more drinks containing alcohol in one day? Never   In the PAST 12 months, how often have you used any prescription medications just for the feeling, more than prescribed, or that were not prescribed for you? Prescriptions may include: opioids, benzodiazepines, medications for ADHD Never   In the PAST 12 MONTHS, how often have you used any drugs, including marijuana, cocaine or crack, heroin, methamphetamine, hallucinogens, ecstasy/MDMA? Never   Fall Risk Assessment   Do you feel unsteady when standing or walking? Yes   Do you worry about falling? No   Have you fallen in the past year? Yes   How many times have you fallen? 2 or more times   Were you ever injured from falling? Yes   Urinary Incontinence Screen   Do you ever leak urine when you don't want to? No

## 2024-11-02 NOTE — Nursing Note (Signed)
 Here for annual medicare wellness examination.

## 2024-11-07 ENCOUNTER — Other Ambulatory Visit (INDEPENDENT_AMBULATORY_CARE_PROVIDER_SITE_OTHER): Payer: Self-pay

## 2024-11-07 DIAGNOSIS — Z7189 Other specified counseling: Secondary | ICD-10-CM

## 2024-11-07 NOTE — Nursing Note (Signed)
 Population Health Management   Support for Social Needs  Social Work - Call Attempt    DATE: 11/07/2024, 13:14  PATIENT NAME: Elizabeth Page  MRN: [id number]  DOB: 1946/09/29  PCP: Izetta JINNY Hawking, PA-C  INSURANCE: Payor: [employer] / Plan: [employer] / Product Type: PPO /       This MSW attempted to contact patient for program enrollment. Patient unreachable at this time. MSW left a detailed message with request for return call.     This is the first attempt to contact this patient.       Geni Fuss, MSW  11/07/2024, 13:14  Social Worker, [employer]

## 2024-11-09 ENCOUNTER — Other Ambulatory Visit (INDEPENDENT_AMBULATORY_CARE_PROVIDER_SITE_OTHER): Payer: Self-pay

## 2024-11-09 DIAGNOSIS — Z7189 Other specified counseling: Secondary | ICD-10-CM

## 2024-11-09 NOTE — Nursing Note (Signed)
 Population Health Management   Support for Social Needs  Social Work - Call Attempt    DATE: 11/09/2024, 09:42  PATIENT NAME: Elizabeth Page  MRN: [id number]  DOB: 1946-06-01  PCP: Izetta JINNY Hawking, PA-C  INSURANCE: Payor: [employer] / Plan: [employer] / Product Type: PPO /       This MSW attempted to contact patient for program enrollment. Patient unreachable at this time. MSW left another detailed message with request for return call. MSW unable to send MyChart message or letter as mailing address on the chart is not current. Address is for a previous living arrangement; per referral, patient is staying in a hotel as she is currently homeless.     This is the second attempt to contact this patient.       Geni Fuss, MSW  11/09/2024, 09:42  Social Worker, [employer]

## 2024-11-13 ENCOUNTER — Other Ambulatory Visit (INDEPENDENT_AMBULATORY_CARE_PROVIDER_SITE_OTHER): Payer: Self-pay

## 2024-11-13 ENCOUNTER — Ambulatory Visit (HOSPITAL_BASED_OUTPATIENT_CLINIC_OR_DEPARTMENT_OTHER): Payer: Self-pay | Admitting: PHYSICIAN ASSISTANT

## 2024-11-13 DIAGNOSIS — Z7189 Other specified counseling: Secondary | ICD-10-CM

## 2024-11-13 NOTE — Nursing Note (Signed)
 Population Health Management   Support for Social Needs  Social Work - Brief Note   DATE: 11/13/2024, 08:54  PATIENT NAME: Elizabeth Page  MRN: [id number]  DOB: Feb 21, 1946  PCP: Izetta JINNY Hawking, PA-C  INSURANCE: Payor: [employer] / Plan: [employer] / Product Type: PPO /     REASON FOR ENCOUNTER:     Referral closed this date due to inability to contact for program enrollment.       Geni Fuss, MSW  11/13/2024, 08:54  Social Worker, [employer]

## 2024-12-14 ENCOUNTER — Encounter (INDEPENDENT_AMBULATORY_CARE_PROVIDER_SITE_OTHER): Payer: Self-pay | Admitting: PSYCHIATRY
